# Patient Record
Sex: Female | Born: 1935 | Race: Black or African American | Hispanic: No | Smoking: Former smoker
Health system: Southern US, Community
[De-identification: ages and names within clinical notes are randomized; demographics above are authoritative.]

## PROBLEM LIST (undated history)

## (undated) DIAGNOSIS — K219 Gastro-esophageal reflux disease without esophagitis: Secondary | ICD-10-CM

## (undated) DIAGNOSIS — Z9889 Other specified postprocedural states: Secondary | ICD-10-CM

## (undated) DIAGNOSIS — I495 Sick sinus syndrome: Secondary | ICD-10-CM

## (undated) DIAGNOSIS — I219 Acute myocardial infarction, unspecified: Secondary | ICD-10-CM

## (undated) DIAGNOSIS — Z950 Presence of cardiac pacemaker: Secondary | ICD-10-CM

## (undated) DIAGNOSIS — H9190 Unspecified hearing loss, unspecified ear: Secondary | ICD-10-CM

## (undated) DIAGNOSIS — R7989 Other specified abnormal findings of blood chemistry: Secondary | ICD-10-CM

## (undated) DIAGNOSIS — D649 Anemia, unspecified: Secondary | ICD-10-CM

## (undated) DIAGNOSIS — I209 Angina pectoris, unspecified: Secondary | ICD-10-CM

## (undated) DIAGNOSIS — Z9289 Personal history of other medical treatment: Secondary | ICD-10-CM

## (undated) DIAGNOSIS — I2510 Atherosclerotic heart disease of native coronary artery without angina pectoris: Secondary | ICD-10-CM

## (undated) DIAGNOSIS — R778 Other specified abnormalities of plasma proteins: Secondary | ICD-10-CM

## (undated) DIAGNOSIS — R0602 Shortness of breath: Secondary | ICD-10-CM

## (undated) DIAGNOSIS — E785 Hyperlipidemia, unspecified: Secondary | ICD-10-CM

## (undated) DIAGNOSIS — M75100 Unspecified rotator cuff tear or rupture of unspecified shoulder, not specified as traumatic: Secondary | ICD-10-CM

## (undated) DIAGNOSIS — I10 Essential (primary) hypertension: Secondary | ICD-10-CM

## (undated) HISTORY — PX: APPENDECTOMY: SHX54

## (undated) HISTORY — DX: Sick sinus syndrome: I49.5

## (undated) HISTORY — DX: Personal history of other medical treatment: Z92.89

## (undated) HISTORY — PX: THYROIDECTOMY: SHX17

## (undated) HISTORY — PX: INSERT / REPLACE / REMOVE PACEMAKER: SUR710

---

## 1998-01-20 ENCOUNTER — Other Ambulatory Visit: Admission: RE | Admit: 1998-01-20 | Discharge: 1998-01-20 | Payer: Self-pay | Admitting: Family Medicine

## 1999-08-03 ENCOUNTER — Emergency Department (HOSPITAL_COMMUNITY): Admission: EM | Admit: 1999-08-03 | Discharge: 1999-08-03 | Payer: Self-pay | Admitting: Emergency Medicine

## 1999-08-03 ENCOUNTER — Encounter: Payer: Self-pay | Admitting: Emergency Medicine

## 2000-03-05 ENCOUNTER — Encounter: Admission: RE | Admit: 2000-03-05 | Discharge: 2000-03-05 | Payer: Self-pay | Admitting: Family Medicine

## 2000-03-05 ENCOUNTER — Encounter: Payer: Self-pay | Admitting: Family Medicine

## 2000-09-20 ENCOUNTER — Emergency Department (HOSPITAL_COMMUNITY): Admission: EM | Admit: 2000-09-20 | Discharge: 2000-09-21 | Payer: Self-pay | Admitting: Emergency Medicine

## 2000-10-25 ENCOUNTER — Encounter: Payer: Self-pay | Admitting: Urology

## 2000-10-25 ENCOUNTER — Encounter: Admission: RE | Admit: 2000-10-25 | Discharge: 2000-10-25 | Payer: Self-pay | Admitting: Urology

## 2001-01-23 ENCOUNTER — Encounter: Payer: Self-pay | Admitting: Surgery

## 2001-01-23 ENCOUNTER — Encounter: Admission: RE | Admit: 2001-01-23 | Discharge: 2001-01-23 | Payer: Self-pay | Admitting: Surgery

## 2001-03-08 ENCOUNTER — Encounter: Admission: RE | Admit: 2001-03-08 | Discharge: 2001-03-08 | Payer: Self-pay | Admitting: Family Medicine

## 2001-03-08 ENCOUNTER — Encounter: Payer: Self-pay | Admitting: Family Medicine

## 2001-06-26 ENCOUNTER — Encounter: Payer: Self-pay | Admitting: Family Medicine

## 2001-06-26 ENCOUNTER — Encounter: Admission: RE | Admit: 2001-06-26 | Discharge: 2001-06-26 | Payer: Self-pay | Admitting: Family Medicine

## 2002-03-10 ENCOUNTER — Encounter: Payer: Self-pay | Admitting: Family Medicine

## 2002-03-10 ENCOUNTER — Encounter: Admission: RE | Admit: 2002-03-10 | Discharge: 2002-03-10 | Payer: Self-pay | Admitting: Family Medicine

## 2002-03-19 ENCOUNTER — Encounter: Admission: RE | Admit: 2002-03-19 | Discharge: 2002-03-19 | Payer: Self-pay | Admitting: Family Medicine

## 2002-03-19 ENCOUNTER — Encounter: Payer: Self-pay | Admitting: Family Medicine

## 2002-05-01 ENCOUNTER — Emergency Department (HOSPITAL_COMMUNITY): Admission: EM | Admit: 2002-05-01 | Discharge: 2002-05-01 | Payer: Self-pay | Admitting: *Deleted

## 2002-05-02 ENCOUNTER — Emergency Department (HOSPITAL_COMMUNITY): Admission: EM | Admit: 2002-05-02 | Discharge: 2002-05-02 | Payer: Self-pay | Admitting: Emergency Medicine

## 2003-04-15 ENCOUNTER — Encounter: Admission: RE | Admit: 2003-04-15 | Discharge: 2003-04-15 | Payer: Self-pay | Admitting: Family Medicine

## 2003-04-15 ENCOUNTER — Encounter: Payer: Self-pay | Admitting: Family Medicine

## 2003-11-04 ENCOUNTER — Other Ambulatory Visit: Admission: RE | Admit: 2003-11-04 | Discharge: 2003-11-04 | Payer: Self-pay | Admitting: Family Medicine

## 2004-02-23 ENCOUNTER — Encounter: Admission: RE | Admit: 2004-02-23 | Discharge: 2004-02-23 | Payer: Self-pay | Admitting: Family Medicine

## 2004-02-25 ENCOUNTER — Encounter: Admission: RE | Admit: 2004-02-25 | Discharge: 2004-02-25 | Payer: Self-pay | Admitting: Family Medicine

## 2004-04-22 ENCOUNTER — Encounter: Admission: RE | Admit: 2004-04-22 | Discharge: 2004-04-22 | Payer: Self-pay | Admitting: Family Medicine

## 2004-10-18 ENCOUNTER — Emergency Department (HOSPITAL_COMMUNITY): Admission: EM | Admit: 2004-10-18 | Discharge: 2004-10-19 | Payer: Self-pay | Admitting: Emergency Medicine

## 2005-09-20 ENCOUNTER — Encounter: Admission: RE | Admit: 2005-09-20 | Discharge: 2005-09-20 | Payer: Self-pay | Admitting: Family Medicine

## 2006-11-12 ENCOUNTER — Encounter: Admission: RE | Admit: 2006-11-12 | Discharge: 2006-11-12 | Payer: Self-pay | Admitting: Family Medicine

## 2007-05-01 ENCOUNTER — Encounter: Admission: RE | Admit: 2007-05-01 | Discharge: 2007-05-01 | Payer: Self-pay | Admitting: Family Medicine

## 2007-09-30 ENCOUNTER — Encounter: Admission: RE | Admit: 2007-09-30 | Discharge: 2007-09-30 | Payer: Self-pay | Admitting: Gastroenterology

## 2007-12-23 ENCOUNTER — Encounter: Admission: RE | Admit: 2007-12-23 | Discharge: 2007-12-23 | Payer: Self-pay | Admitting: Family Medicine

## 2007-12-28 ENCOUNTER — Emergency Department (HOSPITAL_COMMUNITY): Admission: EM | Admit: 2007-12-28 | Discharge: 2007-12-28 | Payer: Self-pay | Admitting: Emergency Medicine

## 2008-01-13 ENCOUNTER — Encounter: Admission: RE | Admit: 2008-01-13 | Discharge: 2008-01-13 | Payer: Self-pay | Admitting: Gastroenterology

## 2008-09-28 ENCOUNTER — Emergency Department (HOSPITAL_COMMUNITY): Admission: EM | Admit: 2008-09-28 | Discharge: 2008-09-28 | Payer: Self-pay | Admitting: Emergency Medicine

## 2008-10-02 ENCOUNTER — Encounter: Admission: RE | Admit: 2008-10-02 | Discharge: 2008-10-02 | Payer: Self-pay | Admitting: Family Medicine

## 2008-12-25 ENCOUNTER — Encounter: Admission: RE | Admit: 2008-12-25 | Discharge: 2008-12-25 | Payer: Self-pay | Admitting: Family Medicine

## 2009-11-30 ENCOUNTER — Ambulatory Visit: Payer: Self-pay | Admitting: Vascular Surgery

## 2009-11-30 ENCOUNTER — Ambulatory Visit (HOSPITAL_COMMUNITY)
Admission: RE | Admit: 2009-11-30 | Discharge: 2009-11-30 | Payer: Self-pay | Source: Home / Self Care | Admitting: Family Medicine

## 2009-11-30 ENCOUNTER — Encounter (INDEPENDENT_AMBULATORY_CARE_PROVIDER_SITE_OTHER): Payer: Self-pay | Admitting: Family Medicine

## 2010-01-12 ENCOUNTER — Encounter: Admission: RE | Admit: 2010-01-12 | Discharge: 2010-01-12 | Payer: Self-pay | Admitting: Family Medicine

## 2010-06-30 HISTORY — PX: OTHER SURGICAL HISTORY: SHX169

## 2010-06-30 HISTORY — PX: PACEMAKER INSERTION: SHX728

## 2010-07-19 ENCOUNTER — Inpatient Hospital Stay (HOSPITAL_COMMUNITY): Admission: EM | Admit: 2010-07-19 | Discharge: 2010-07-23 | Payer: Self-pay | Admitting: Emergency Medicine

## 2010-07-19 ENCOUNTER — Emergency Department (HOSPITAL_COMMUNITY): Admission: EM | Admit: 2010-07-19 | Discharge: 2010-07-19 | Payer: Self-pay | Admitting: Emergency Medicine

## 2010-07-19 HISTORY — PX: CARDIAC CATHETERIZATION: SHX172

## 2011-01-12 LAB — CARDIAC PANEL(CRET KIN+CKTOT+MB+TROPI)
CK, MB: 10.4 ng/mL (ref 0.3–4.0)
CK, MB: 114.7 ng/mL (ref 0.3–4.0)
CK, MB: 74.7 ng/mL (ref 0.3–4.0)
CK, MB: 82.4 ng/mL (ref 0.3–4.0)
Relative Index: 11.7 — ABNORMAL HIGH (ref 0.0–2.5)
Relative Index: 12.9 — ABNORMAL HIGH (ref 0.0–2.5)
Relative Index: 14.1 — ABNORMAL HIGH (ref 0.0–2.5)
Relative Index: 3.3 — ABNORMAL HIGH (ref 0.0–2.5)
Total CK: 312 U/L — ABNORMAL HIGH (ref 7–177)
Total CK: 636 U/L — ABNORMAL HIGH (ref 7–177)
Total CK: 637 U/L — ABNORMAL HIGH (ref 7–177)
Total CK: 815 U/L — ABNORMAL HIGH (ref 7–177)
Troponin I: 11.75 ng/mL (ref 0.00–0.06)
Troponin I: 22.46 ng/mL (ref 0.00–0.06)
Troponin I: 29.17 ng/mL (ref 0.00–0.06)
Troponin I: 7.37 ng/mL (ref 0.00–0.06)

## 2011-01-12 LAB — CBC
HCT: 33.1 % — ABNORMAL LOW (ref 36.0–46.0)
HCT: 34.2 % — ABNORMAL LOW (ref 36.0–46.0)
HCT: 38.1 % (ref 36.0–46.0)
Hemoglobin: 10.8 g/dL — ABNORMAL LOW (ref 12.0–15.0)
Hemoglobin: 11.3 g/dL — ABNORMAL LOW (ref 12.0–15.0)
Hemoglobin: 13 g/dL (ref 12.0–15.0)
MCH: 29.5 pg (ref 26.0–34.0)
MCH: 29.8 pg (ref 26.0–34.0)
MCH: 30.2 pg (ref 26.0–34.0)
MCHC: 33 g/dL (ref 30.0–36.0)
MCHC: 33.2 g/dL (ref 30.0–36.0)
MCHC: 34.1 g/dL (ref 30.0–36.0)
MCV: 88.6 fL (ref 78.0–100.0)
MCV: 89.3 fL (ref 78.0–100.0)
MCV: 89.7 fL (ref 78.0–100.0)
Platelets: 173 10*3/uL (ref 150–400)
Platelets: 181 10*3/uL (ref 150–400)
Platelets: 196 10*3/uL (ref 150–400)
RBC: 3.69 MIL/uL — ABNORMAL LOW (ref 3.87–5.11)
RBC: 3.83 MIL/uL — ABNORMAL LOW (ref 3.87–5.11)
RBC: 4.3 MIL/uL (ref 3.87–5.11)
RDW: 14.5 % (ref 11.5–15.5)
RDW: 14.9 % (ref 11.5–15.5)
RDW: 14.9 % (ref 11.5–15.5)
WBC: 6.6 10*3/uL (ref 4.0–10.5)
WBC: 7.2 10*3/uL (ref 4.0–10.5)
WBC: 7.9 10*3/uL (ref 4.0–10.5)

## 2011-01-12 LAB — PROTIME-INR
INR: 1.2 (ref 0.00–1.49)
INR: 1.24 (ref 0.00–1.49)
Prothrombin Time: 15.4 seconds — ABNORMAL HIGH (ref 11.6–15.2)
Prothrombin Time: 15.8 seconds — ABNORMAL HIGH (ref 11.6–15.2)

## 2011-01-12 LAB — BASIC METABOLIC PANEL
BUN: 9 mg/dL (ref 6–23)
CO2: 24 mEq/L (ref 19–32)
Calcium: 8.4 mg/dL (ref 8.4–10.5)
Chloride: 115 mEq/L — ABNORMAL HIGH (ref 96–112)
Creatinine, Ser: 0.71 mg/dL (ref 0.4–1.2)
GFR calc Af Amer: >60 mL/min (ref >60)
GFR calc non Af Amer: >60 mL/min (ref >60)
Glucose, Bld: 88 mg/dL (ref 70–99)
Potassium: 3.8 mEq/L (ref 3.5–5.1)
Sodium: 143 mEq/L (ref 135–145)

## 2011-01-12 LAB — COMPREHENSIVE METABOLIC PANEL
ALT: 17 U/L (ref 0–35)
AST: 65 U/L — ABNORMAL HIGH (ref 0–37)
Albumin: 3 g/dL — ABNORMAL LOW (ref 3.5–5.2)
Alkaline Phosphatase: 43 U/L (ref 39–117)
BUN: 9 mg/dL (ref 6–23)
CO2: 23 mEq/L (ref 19–32)
Calcium: 8.5 mg/dL (ref 8.4–10.5)
Chloride: 113 mEq/L — ABNORMAL HIGH (ref 96–112)
Creatinine, Ser: 0.79 mg/dL (ref 0.4–1.2)
GFR calc Af Amer: >60 mL/min (ref >60)
GFR calc non Af Amer: >60 mL/min (ref >60)
Glucose, Bld: 85 mg/dL (ref 70–99)
Potassium: 3.9 mEq/L (ref 3.5–5.1)
Sodium: 144 mEq/L (ref 135–145)
Total Bilirubin: 0.6 mg/dL (ref 0.3–1.2)
Total Protein: 5.4 g/dL — ABNORMAL LOW (ref 6.0–8.3)

## 2011-01-12 LAB — POCT CARDIAC MARKERS
CKMB, poc: 19.4 ng/mL (ref 1.0–8.0)
Myoglobin, poc: 80.3 ng/mL (ref 12–200)
Troponin i, poc: 1.1 ng/mL (ref 0.00–0.09)

## 2011-01-12 LAB — POCT I-STAT, CHEM 8
BUN: 9 mg/dL (ref 6–23)
Calcium, Ion: 1.04 mmol/L — ABNORMAL LOW (ref 1.12–1.32)
Chloride: 109 mEq/L (ref 96–112)
Creatinine, Ser: 0.7 mg/dL (ref 0.4–1.2)
Glucose, Bld: 124 mg/dL — ABNORMAL HIGH (ref 70–99)
HCT: 42 % (ref 36.0–46.0)
Hemoglobin: 14.3 g/dL (ref 12.0–15.0)
Potassium: 3.3 mEq/L — ABNORMAL LOW (ref 3.5–5.1)
Sodium: 140 mEq/L (ref 135–145)
TCO2: 21 mmol/L (ref 0–100)

## 2011-01-12 LAB — DIFFERENTIAL
Basophils Absolute: 0.1 10*3/uL (ref 0.0–0.1)
Basophils Relative: 1 % (ref 0–1)
Eosinophils Absolute: 0.2 10*3/uL (ref 0.0–0.7)
Eosinophils Relative: 4 % (ref 0–5)
Lymphocytes Relative: 43 % (ref 12–46)
Lymphs Abs: 2.8 10*3/uL (ref 0.7–4.0)
Monocytes Absolute: 0.6 10*3/uL (ref 0.1–1.0)
Monocytes Relative: 9 % (ref 3–12)
Neutro Abs: 2.9 10*3/uL (ref 1.7–7.7)
Neutrophils Relative %: 44 % (ref 43–77)

## 2011-01-12 LAB — LIPID PANEL
Cholesterol: 185 mg/dL (ref 0–200)
HDL: 65 mg/dL (ref >39)
LDL Cholesterol: 111 mg/dL — ABNORMAL HIGH (ref 0–99)
Total CHOL/HDL Ratio: 2.8 RATIO
Triglycerides: 43 mg/dL (ref <150)
VLDL: 9 mg/dL (ref 0–40)

## 2011-01-12 LAB — APTT
aPTT: 26 seconds (ref 24–37)
aPTT: 27 seconds (ref 24–37)

## 2011-01-12 LAB — SEDIMENTATION RATE: Sed Rate: 2 mm/hr (ref 0–22)

## 2011-02-16 ENCOUNTER — Other Ambulatory Visit: Payer: Self-pay | Admitting: Family Medicine

## 2011-02-16 DIAGNOSIS — Z1231 Encounter for screening mammogram for malignant neoplasm of breast: Secondary | ICD-10-CM

## 2011-02-20 ENCOUNTER — Ambulatory Visit
Admission: RE | Admit: 2011-02-20 | Discharge: 2011-02-20 | Disposition: A | Discharge status: Home / Self Care | Payer: PRIVATE HEALTH INSURANCE | Source: Ambulatory Visit | Attending: Family Medicine | Admitting: Family Medicine

## 2011-02-20 DIAGNOSIS — Z1231 Encounter for screening mammogram for malignant neoplasm of breast: Secondary | ICD-10-CM

## 2011-03-01 NOTE — Discharge Summary (Signed)
  [name], [name]               ACCOUNT NO.:  [id number]  MEDICAL RECORD NO.:  [id number]            PATIENT TYPE:  LOCATION:                                 FACILITY:  PHYSICIAN:  Osvaldo Shipper. Konnar Ben, M.D.     DATE OF BIRTH:  DATE OF ADMISSION:  07/19/2010 DATE OF DISCHARGE:  07/23/2010                              DISCHARGE SUMMARY   DISCHARGE DIAGNOSES: 1. ST elevation myocardial infarction. 2. Symptomatic bradycardia. 3. Pacemaker insertion. 4. Hypertension. 5. Degenerative joint disease.  [name] is a 77 year old patient who has a history of depression, degenerative joint disease who came to the emergency department with retrosternal chest pain radiating to the back which woke her up at approximately 2 a.m. on the day of admission.  The patient was brought to the emergency department by EMS.  EKG done showed a sinus bradycardia with first-degree AV block, septal Q-waves with ST elevation in the anterior, lateral and inferior level.  The patient had a CT scan of the chest, abdomen and pelvis which showed no evidence of dissection or PE. The patient was subsequently admitted with acute myocardial infarction.  HOSPITAL COURSE:  Conference was held with the patient and the patient's family concerning the risk and benefits of cardiac catheterization and PTCA and stenting and they are in agreement to proceed with this measure  The patient was taken to the cardiac catheterization laboratory on July 19, 2010, where she underwent a left cardiac cath with selective right and left coronary angiography, successful PTA to the mid LAD.  Successful deployment of a 2.75 x 28-mm long PROMUS drug-eluting stent in the mid LAD and successful dilatation of this stent using the 2.75 x 20-mm [place] tract balloon.  The patient tolerated the procedure well.  She began to show steady improvement in her pain.  She was gradually back to her baseline.  She had serial electrocardiograms  which were reviewed and she received phase one of cardiac rehab while in the hospital setting.  The patient was seen by pharmacy for anticoagulation protocols and she will follow up with Dr. [name] for outpatient coagulation followup and protocol.  The patient showed a steady uphill course and it was the opinion that she had received maximum benefit from this hospitalization and could be discharged home.  The patient is advised to continue her current medications. Anticoagulation will be discussed with Dr. [name] during her 1 week appointment.  The patient also required pacemaker placement.  This was done by Dr. Lendell Caprice without any complication and she will be followed by Dr. Lendell Caprice in 2 weeks for evaluation of the pacemaker.  The patient is advised to notify her physician immediately if any changes, problems or concerns.     Ivery Quale, P.A.   ______________________________ Osvaldo Shipper. Zoella Roberti, M.D.    HB/MEDQ  D:  02/16/2011  T:  02/17/2011  Job:  [id number]  cc:   Osvaldo Shipper. Mclain Freer, M.D. Fax: [phone]  Electronically Signed by Ivery Quale P.A. on 02/23/2011 10:46:49 AM Electronically Signed by Donia Guiles M.D. on 03/01/2011 12:11:20 PM

## 2011-06-06 ENCOUNTER — Inpatient Hospital Stay (HOSPITAL_COMMUNITY): Payer: Medicare Other

## 2011-06-06 ENCOUNTER — Emergency Department (HOSPITAL_COMMUNITY): Payer: Medicare Other

## 2011-06-06 ENCOUNTER — Inpatient Hospital Stay (HOSPITAL_COMMUNITY)
Admission: EM | Admit: 2011-06-06 | Discharge: 2011-06-12 | DRG: 377 | Disposition: A | Discharge status: Home Health | Payer: Medicare Other | Attending: Internal Medicine | Admitting: Internal Medicine

## 2011-06-06 ENCOUNTER — Encounter (HOSPITAL_COMMUNITY): Payer: Self-pay | Admitting: Radiology

## 2011-06-06 DIAGNOSIS — I214 Non-ST elevation (NSTEMI) myocardial infarction: Secondary | ICD-10-CM | POA: Diagnosis present

## 2011-06-06 DIAGNOSIS — K5731 Diverticulosis of large intestine without perforation or abscess with bleeding: Principal | ICD-10-CM | POA: Diagnosis present

## 2011-06-06 DIAGNOSIS — N390 Urinary tract infection, site not specified: Secondary | ICD-10-CM | POA: Diagnosis not present

## 2011-06-06 DIAGNOSIS — H409 Unspecified glaucoma: Secondary | ICD-10-CM | POA: Diagnosis present

## 2011-06-06 DIAGNOSIS — I2510 Atherosclerotic heart disease of native coronary artery without angina pectoris: Secondary | ICD-10-CM | POA: Diagnosis present

## 2011-06-06 DIAGNOSIS — I252 Old myocardial infarction: Secondary | ICD-10-CM

## 2011-06-06 DIAGNOSIS — I10 Essential (primary) hypertension: Secondary | ICD-10-CM | POA: Diagnosis present

## 2011-06-06 DIAGNOSIS — D62 Acute posthemorrhagic anemia: Secondary | ICD-10-CM | POA: Diagnosis present

## 2011-06-06 DIAGNOSIS — Z7982 Long term (current) use of aspirin: Secondary | ICD-10-CM

## 2011-06-06 DIAGNOSIS — E785 Hyperlipidemia, unspecified: Secondary | ICD-10-CM | POA: Diagnosis present

## 2011-06-06 DIAGNOSIS — Z9861 Coronary angioplasty status: Secondary | ICD-10-CM

## 2011-06-06 HISTORY — DX: Essential (primary) hypertension: I10

## 2011-06-06 LAB — CBC
HCT: 30.2 % — ABNORMAL LOW (ref 36.0–46.0)
HCT: 27.4 % — ABNORMAL LOW (ref 36.0–46.0)
HCT: 26.5 % — ABNORMAL LOW (ref 36.0–46.0)
Hemoglobin: 10.1 g/dL — ABNORMAL LOW (ref 12.0–15.0)
Hemoglobin: 9.2 g/dL — ABNORMAL LOW (ref 12.0–15.0)
Hemoglobin: 9 g/dL — ABNORMAL LOW (ref 12.0–15.0)
MCH: 30 pg (ref 26.0–34.0)
MCH: 30.3 pg (ref 26.0–34.0)
MCH: 30.5 pg (ref 26.0–34.0)
MCHC: 33.4 g/dL (ref 30.0–36.0)
MCHC: 33.6 g/dL (ref 30.0–36.0)
MCHC: 34 g/dL (ref 30.0–36.0)
MCV: 89.6 fL (ref 78.0–100.0)
MCV: 90.1 fL (ref 78.0–100.0)
MCV: 89.8 fL (ref 78.0–100.0)
Platelets: 191 10*3/uL (ref 150–400)
Platelets: 164 10*3/uL (ref 150–400)
Platelets: 179 10*3/uL (ref 150–400)
RBC: 3.37 MIL/uL — ABNORMAL LOW (ref 3.87–5.11)
RBC: 3.04 MIL/uL — ABNORMAL LOW (ref 3.87–5.11)
RBC: 2.95 MIL/uL — ABNORMAL LOW (ref 3.87–5.11)
RDW: 14.3 % (ref 11.5–15.5)
RDW: 14.5 % (ref 11.5–15.5)
RDW: 14.5 % (ref 11.5–15.5)
WBC: 6.7 10*3/uL (ref 4.0–10.5)
WBC: 7 10*3/uL (ref 4.0–10.5)
WBC: 7.3 10*3/uL (ref 4.0–10.5)

## 2011-06-06 LAB — COMPREHENSIVE METABOLIC PANEL
ALT: 11 U/L (ref 0–35)
AST: 18 U/L (ref 0–37)
Albumin: 3.4 g/dL — ABNORMAL LOW (ref 3.5–5.2)
Alkaline Phosphatase: 44 U/L (ref 39–117)
BUN: 12 mg/dL (ref 6–23)
CO2: 24 mEq/L (ref 19–32)
Calcium: 8.8 mg/dL (ref 8.4–10.5)
Chloride: 112 mEq/L (ref 96–112)
Creatinine, Ser: 0.71 mg/dL (ref 0.50–1.10)
GFR calc Af Amer: >60 mL/min (ref >60)
GFR calc non Af Amer: >60 mL/min (ref >60)
Glucose, Bld: 116 mg/dL — ABNORMAL HIGH (ref 70–99)
Potassium: 3.9 mEq/L (ref 3.5–5.1)
Sodium: 144 mEq/L (ref 135–145)
Total Bilirubin: 0.3 mg/dL (ref 0.3–1.2)
Total Protein: 5.6 g/dL — ABNORMAL LOW (ref 6.0–8.3)

## 2011-06-06 LAB — ABO/RH: ABO/RH(D): O POS

## 2011-06-06 LAB — DIFFERENTIAL
Basophils Absolute: 0 10*3/uL (ref 0.0–0.1)
Basophils Relative: 0 % (ref 0–1)
Eosinophils Absolute: 0.2 10*3/uL (ref 0.0–0.7)
Eosinophils Relative: 4 % (ref 0–5)
Lymphocytes Relative: 23 % (ref 12–46)
Lymphs Abs: 1.5 10*3/uL (ref 0.7–4.0)
Monocytes Absolute: 0.8 10*3/uL (ref 0.1–1.0)
Monocytes Relative: 12 % (ref 3–12)
Neutro Abs: 4.1 10*3/uL (ref 1.7–7.7)
Neutrophils Relative %: 62 % (ref 43–77)

## 2011-06-06 LAB — PROTIME-INR
INR: 1.38 (ref 0.00–1.49)
Prothrombin Time: 17.2 seconds — ABNORMAL HIGH (ref 11.6–15.2)

## 2011-06-06 LAB — CARDIAC PANEL(CRET KIN+CKTOT+MB+TROPI)
CK, MB: 5.7 ng/mL — ABNORMAL HIGH (ref 0.3–4.0)
CK, MB: 5.1 ng/mL — ABNORMAL HIGH (ref 0.3–4.0)
Relative Index: 2.4 (ref 0.0–2.5)
Relative Index: 2.4 (ref 0.0–2.5)
Total CK: 242 U/L — ABNORMAL HIGH (ref 7–177)
Total CK: 215 U/L — ABNORMAL HIGH (ref 7–177)
Troponin I: <0.3 ng/mL (ref <0.30)
Troponin I: <0.3 ng/mL (ref <0.30)

## 2011-06-06 LAB — LACTIC ACID, PLASMA: Lactic Acid, Venous: 1.5 mmol/L (ref 0.5–2.2)

## 2011-06-06 LAB — POCT I-STAT TROPONIN I: Troponin i, poc: 0.02 ng/mL (ref 0.00–0.08)

## 2011-06-06 LAB — MRSA PCR SCREENING: MRSA by PCR: NEGATIVE

## 2011-06-06 LAB — OCCULT BLOOD, POC DEVICE: Fecal Occult Bld: POSITIVE

## 2011-06-06 MED ORDER — IOHEXOL 300 MG/ML  SOLN
80.0000 mL | Freq: Once | INTRAMUSCULAR | Status: AC | PRN
  Administered 2011-06-06: 80 mL via INTRAVENOUS

## 2011-06-07 LAB — CBC
HCT: 23.8 % — ABNORMAL LOW (ref 36.0–46.0)
HCT: 24.1 % — ABNORMAL LOW (ref 36.0–46.0)
Hemoglobin: 8.2 g/dL — ABNORMAL LOW (ref 12.0–15.0)
Hemoglobin: 8.2 g/dL — ABNORMAL LOW (ref 12.0–15.0)
MCH: 30.8 pg (ref 26.0–34.0)
MCH: 30.6 pg (ref 26.0–34.0)
MCHC: 34.5 g/dL (ref 30.0–36.0)
MCHC: 34 g/dL (ref 30.0–36.0)
MCV: 89.5 fL (ref 78.0–100.0)
MCV: 89.9 fL (ref 78.0–100.0)
Platelets: 160 10*3/uL (ref 150–400)
Platelets: 148 10*3/uL — ABNORMAL LOW (ref 150–400)
RBC: 2.66 MIL/uL — ABNORMAL LOW (ref 3.87–5.11)
RBC: 2.68 MIL/uL — ABNORMAL LOW (ref 3.87–5.11)
RDW: 14.8 % (ref 11.5–15.5)
RDW: 14.7 % (ref 11.5–15.5)
WBC: 7.8 10*3/uL (ref 4.0–10.5)
WBC: 8.1 10*3/uL (ref 4.0–10.5)

## 2011-06-07 LAB — CARDIAC PANEL(CRET KIN+CKTOT+MB+TROPI)
CK, MB: 6.5 ng/mL (ref 0.3–4.0)
Relative Index: 3.4 — ABNORMAL HIGH (ref 0.0–2.5)
Total CK: 192 U/L — ABNORMAL HIGH (ref 7–177)
Troponin I: 0.33 ng/mL (ref <0.30)

## 2011-06-07 LAB — COMPREHENSIVE METABOLIC PANEL
ALT: 8 U/L (ref 0–35)
AST: 15 U/L (ref 0–37)
Albumin: 2.6 g/dL — ABNORMAL LOW (ref 3.5–5.2)
Alkaline Phosphatase: 35 U/L — ABNORMAL LOW (ref 39–117)
BUN: 11 mg/dL (ref 6–23)
CO2: 22 mEq/L (ref 19–32)
Calcium: 7.9 mg/dL — ABNORMAL LOW (ref 8.4–10.5)
Chloride: 113 mEq/L — ABNORMAL HIGH (ref 96–112)
Creatinine, Ser: 0.62 mg/dL (ref 0.50–1.10)
GFR calc Af Amer: >60 mL/min (ref >60)
GFR calc non Af Amer: >60 mL/min (ref >60)
Glucose, Bld: 90 mg/dL (ref 70–99)
Potassium: 3.4 mEq/L — ABNORMAL LOW (ref 3.5–5.1)
Sodium: 141 mEq/L (ref 135–145)
Total Bilirubin: 0.3 mg/dL (ref 0.3–1.2)
Total Protein: 4.6 g/dL — ABNORMAL LOW (ref 6.0–8.3)

## 2011-06-07 LAB — GLUCOSE, CAPILLARY
Glucose-Capillary: 97 mg/dL (ref 70–99)
Glucose-Capillary: 77 mg/dL (ref 70–99)

## 2011-06-08 LAB — CBC
HCT: 23.6 % — ABNORMAL LOW (ref 36.0–46.0)
Hemoglobin: 7.9 g/dL — ABNORMAL LOW (ref 12.0–15.0)
MCH: 30.2 pg (ref 26.0–34.0)
MCHC: 33.5 g/dL (ref 30.0–36.0)
MCV: 90.1 fL (ref 78.0–100.0)
Platelets: 162 10*3/uL (ref 150–400)
RBC: 2.62 MIL/uL — ABNORMAL LOW (ref 3.87–5.11)
RDW: 14.4 % (ref 11.5–15.5)
WBC: 9 10*3/uL (ref 4.0–10.5)

## 2011-06-08 LAB — BASIC METABOLIC PANEL
BUN: 10 mg/dL (ref 6–23)
CO2: 24 mEq/L (ref 19–32)
Calcium: 8.3 mg/dL — ABNORMAL LOW (ref 8.4–10.5)
Chloride: 114 mEq/L — ABNORMAL HIGH (ref 96–112)
Creatinine, Ser: 0.6 mg/dL (ref 0.50–1.10)
GFR calc Af Amer: >60 mL/min (ref >60)
GFR calc non Af Amer: >60 mL/min (ref >60)
Glucose, Bld: 95 mg/dL (ref 70–99)
Potassium: 3.9 mEq/L (ref 3.5–5.1)
Sodium: 142 mEq/L (ref 135–145)

## 2011-06-08 LAB — CK TOTAL AND CKMB (NOT AT ARMC)
CK, MB: 4.8 ng/mL — ABNORMAL HIGH (ref 0.3–4.0)
Relative Index: 2.4 (ref 0.0–2.5)
Total CK: 199 U/L — ABNORMAL HIGH (ref 7–177)

## 2011-06-08 LAB — MAGNESIUM: Magnesium: 2.1 mg/dL (ref 1.5–2.5)

## 2011-06-08 LAB — GLUCOSE, CAPILLARY: Glucose-Capillary: 77 mg/dL (ref 70–99)

## 2011-06-08 LAB — TROPONIN I: Troponin I: 0.75 ng/mL (ref <0.30)

## 2011-06-08 LAB — PREPARE RBC (CROSSMATCH)

## 2011-06-09 LAB — BASIC METABOLIC PANEL
BUN: 7 mg/dL (ref 6–23)
CO2: 26 mEq/L (ref 19–32)
Calcium: 8.5 mg/dL (ref 8.4–10.5)
Chloride: 110 mEq/L (ref 96–112)
Creatinine, Ser: 0.63 mg/dL (ref 0.50–1.10)
GFR calc Af Amer: >60 mL/min (ref >60)
GFR calc non Af Amer: >60 mL/min (ref >60)
Glucose, Bld: 103 mg/dL — ABNORMAL HIGH (ref 70–99)
Potassium: 3.4 mEq/L — ABNORMAL LOW (ref 3.5–5.1)
Sodium: 142 mEq/L (ref 135–145)

## 2011-06-09 LAB — CBC
HCT: 32.5 % — ABNORMAL LOW (ref 36.0–46.0)
Hemoglobin: 11.3 g/dL — ABNORMAL LOW (ref 12.0–15.0)
MCH: 30.3 pg (ref 26.0–34.0)
MCHC: 34.8 g/dL (ref 30.0–36.0)
MCV: 87.1 fL (ref 78.0–100.0)
Platelets: 173 10*3/uL (ref 150–400)
RBC: 3.73 MIL/uL — ABNORMAL LOW (ref 3.87–5.11)
RDW: 14.6 % (ref 11.5–15.5)
WBC: 10.9 10*3/uL — ABNORMAL HIGH (ref 4.0–10.5)

## 2011-06-09 LAB — TYPE AND SCREEN
ABO/RH(D): O POS
Antibody Screen: NEGATIVE
Unit division: 0
Unit division: 0

## 2011-06-09 LAB — PRO B NATRIURETIC PEPTIDE: Pro B Natriuretic peptide (BNP): 862.3 pg/mL — ABNORMAL HIGH (ref 0–450)

## 2011-06-09 LAB — CK TOTAL AND CKMB (NOT AT ARMC)
CK, MB: 3.9 ng/mL (ref 0.3–4.0)
Relative Index: 2 (ref 0.0–2.5)
Total CK: 195 U/L — ABNORMAL HIGH (ref 7–177)

## 2011-06-09 LAB — TROPONIN I: Troponin I: 0.73 ng/mL (ref <0.30)

## 2011-06-09 NOTE — Consult Note (Signed)
[name], [name]               ACCOUNT NO.:  [id number]  MEDICAL RECORD NO.:  [id number]  LOCATION:  3306                         FACILITY:  [facility]  PHYSICIAN:  Willis Modena, MD     DATE OF BIRTH:  02-29-1918  DATE OF CONSULTATION:  06/06/2011 DATE OF DISCHARGE:                                CONSULTATION   PRIMARY CARE PHYSICIAN:  L. Lupe Carney, MD  CARDIOLOGIST:  Thurmon Fair, MD  REASON FOR CONSULTATION:  Hematochezia.  CHIEF COMPLAINT:  Hematochezia.  HISTORY OF PRESENT ILLNESS:  [name] is a 77 year old female with history of coronary artery disease with stenting for ST-elevation MI in September 2011.  She has multiple other medical problems but has been in a fairly good state of health otherwise.  She presented to the emergency department with complaints of profuse hematochezia.  She also notes some generalized crampy abdominal pain which decreases after defecation.  She denies any nausea, vomiting, dysphagia, odynophagia, loss of appetite, or weight loss.  She tells me she has had a colonoscopy in the past but I do not see any evidence of that on our [facility] records.  It does show that she saw Dr. Janann August in 2009 and had a barium enema at that time that showed diffuse diverticulosis, but especially severe in the rectosigmoid area.  Past medical history, past surgical history, home medications, allergies, family history, social history, review of systems all from dictated note from Dr. Toniann Fail dated June 06, 2011 are reviewed and I agree.  PHYSICAL EXAMINATION:  VITAL SIGNS:  Heart rate is about 60 and blood pressure is 123/65. GENERAL:  [name] is an elderly female, but in no acute distress, young appearing than stated age. NECK:  Supple. ENT:  Normocephalic and atraumatic.  Slightly dry mucous membranes. EYES:  Sclerae anicteric.  Conjunctivae are pink. LUNGS:  Clear. HEART:  She has a systolic murmur, best heard at the left upper  sternal border.  She appears to have a paced rhythm. ABDOMEN:  Mild crampy discomfort in her lower abdomen, hyperactive bowel sounds.  No liver or splenic enlargement.  No bulging flank to suggest ascites. EXTREMITIES:  No peripheral cyanosis, clubbing, or edema. NEUROLOGIC:  Diffusely nonfocal without lateralizing signs. SKIN:  No rash or ecchymoses. LYMPHATICS:  No palpable axillary, submandibular, or supraclavicular adenopathy. PSYCHIATRIC:  Age appropriate mood and affect.  LABORATORY STUDIES:  Hemoglobin 9.2 down from 10.1 at admission, white count 7.0, and platelet count is 164.  INR 1.34.  Sodium 144, potassium 3.9, chloride 112, bicarb 24, BUN 12, and creatinine 0.71.  Total protein is 5.6, albumin 3.4, total bilirubin 0.3, alk phos 44, AST 18, and ALT 11.  Abdominal x-ray that showed no acute cardiopulmonary disease, nonspecific bowel gas pattern.  IMPRESSION:  [name] is a 77 year old female presenting with hematochezia.  I suspect this is a diverticular bleed.  She is hemodynamically stable at this point.  PLAN: 1. Continue supportive management with IV fluids and transfusions as     needed with serial CBCs. 2. We will give her sips of clear liquids and see how she does. 3. If she has recurrence of her bleeding, one could consider a tagged  red blood cell study. 4. I will try to obtain her outside colonoscopy report.     Willis Modena, MD     WO/MEDQ  D:  06/06/2011  T:  06/06/2011  Job:  [id number]  Electronically Signed by Willis Modena  on 06/09/2011 07:25:26 PM

## 2011-06-10 LAB — BASIC METABOLIC PANEL
BUN: 7 mg/dL (ref 6–23)
CO2: 26 mEq/L (ref 19–32)
Calcium: 8.8 mg/dL (ref 8.4–10.5)
Chloride: 109 mEq/L (ref 96–112)
Creatinine, Ser: 0.63 mg/dL (ref 0.50–1.10)
GFR calc Af Amer: >60 mL/min (ref >60)
GFR calc non Af Amer: >60 mL/min (ref >60)
Glucose, Bld: 112 mg/dL — ABNORMAL HIGH (ref 70–99)
Potassium: 4 mEq/L (ref 3.5–5.1)
Sodium: 141 mEq/L (ref 135–145)

## 2011-06-10 LAB — CBC
HCT: 32.3 % — ABNORMAL LOW (ref 36.0–46.0)
Hemoglobin: 10.9 g/dL — ABNORMAL LOW (ref 12.0–15.0)
MCH: 30 pg (ref 26.0–34.0)
MCHC: 33.7 g/dL (ref 30.0–36.0)
MCV: 89 fL (ref 78.0–100.0)
Platelets: 176 10*3/uL (ref 150–400)
RBC: 3.63 MIL/uL — ABNORMAL LOW (ref 3.87–5.11)
RDW: 14.6 % (ref 11.5–15.5)
WBC: 8.7 10*3/uL (ref 4.0–10.5)

## 2011-06-10 LAB — MAGNESIUM: Magnesium: 2.2 mg/dL (ref 1.5–2.5)

## 2011-06-11 LAB — CBC
HCT: 32.7 % — ABNORMAL LOW (ref 36.0–46.0)
Hemoglobin: 11 g/dL — ABNORMAL LOW (ref 12.0–15.0)
MCH: 29.9 pg (ref 26.0–34.0)
MCHC: 33.6 g/dL (ref 30.0–36.0)
MCV: 88.9 fL (ref 78.0–100.0)
Platelets: 201 10*3/uL (ref 150–400)
RBC: 3.68 MIL/uL — ABNORMAL LOW (ref 3.87–5.11)
RDW: 14.3 % (ref 11.5–15.5)
WBC: 7.7 10*3/uL (ref 4.0–10.5)

## 2011-06-12 LAB — CBC
HCT: 33.6 % — ABNORMAL LOW (ref 36.0–46.0)
Hemoglobin: 11.5 g/dL — ABNORMAL LOW (ref 12.0–15.0)
MCH: 30.3 pg (ref 26.0–34.0)
MCHC: 34.2 g/dL (ref 30.0–36.0)
MCV: 88.4 fL (ref 78.0–100.0)
Platelets: 237 10*3/uL (ref 150–400)
RBC: 3.8 MIL/uL — ABNORMAL LOW (ref 3.87–5.11)
RDW: 14.3 % (ref 11.5–15.5)
WBC: 7 10*3/uL (ref 4.0–10.5)

## 2011-06-12 LAB — URINALYSIS, ROUTINE W REFLEX MICROSCOPIC
Bilirubin Urine: NEGATIVE
Glucose, UA: NEGATIVE mg/dL
Hgb urine dipstick: NEGATIVE
Ketones, ur: NEGATIVE mg/dL
Nitrite: NEGATIVE
Protein, ur: NEGATIVE mg/dL
Specific Gravity, Urine: 1.017 (ref 1.005–1.030)
Urobilinogen, UA: 2 mg/dL — ABNORMAL HIGH (ref 0.0–1.0)
pH: 8 (ref 5.0–8.0)

## 2011-06-12 LAB — BASIC METABOLIC PANEL
BUN: 11 mg/dL (ref 6–23)
CO2: 28 mEq/L (ref 19–32)
Calcium: 9.1 mg/dL (ref 8.4–10.5)
Chloride: 108 mEq/L (ref 96–112)
Creatinine, Ser: 0.55 mg/dL (ref 0.50–1.10)
GFR calc Af Amer: >60 mL/min (ref >60)
GFR calc non Af Amer: >60 mL/min (ref >60)
Glucose, Bld: 91 mg/dL (ref 70–99)
Potassium: 4.1 mEq/L (ref 3.5–5.1)
Sodium: 141 mEq/L (ref 135–145)

## 2011-06-12 LAB — URINE MICROSCOPIC-ADD ON

## 2011-06-15 NOTE — Discharge Summary (Signed)
NAMERORI, GOAR               ACCOUNT NO.:  [id number]  MEDICAL RECORD NO.:  [id number]  LOCATION:  5528                         FACILITY:  [facility]  PHYSICIAN:  Andreas Blower, MD       DATE OF BIRTH:  [birth year]  DATE OF ADMISSION:  06/06/2011 DATE OF DISCHARGE:  06/12/2011                              DISCHARGE SUMMARY   PRIMARY CARE PHYSICIAN:  L. Lupe Carney, MD  CARDIOLOGIST:  Eduardo Osier. [name], MD  DISCHARGE DIAGNOSES: 1. Gastrointestinal bleeding, likely diverticular in nature, status     post transfusion of 2 units of packed red blood cells. 2. Non-ST elevation myocardial infarction - likely from demand     ischemia, discharged on aspirin. 3. Acute blood loss anemia. 4. History of coronary artery disease, status post stent placement and     pacemaker. 5. History of hypertension. 6. Glaucoma. 7. Hyperlipidemia. 8. Urinary tract infection.  CONDITION AT TIME OF DISCHARGE:  Ms. Lavanda Nevels appears well.  She is eating well.  She is ambulating in the hallway with physical therapy on a rolling walker.  She is ready for discharge to home where she lives with her sister who is also elderly and somewhat infirm.  BRIEF HISTORY AND HOSPITAL COURSE:  This is a 77 year old female with a history of coronary artery disease, status post stenting for ST- elevation MI in September 2011.  She presented to the emergency department experiencing rectal bleeding.  She stated that initially she was constipated.  She took some medicines, started having bowel movements, but eventually developed pain and rectal bleeding.  She had multiple episodes of bleeding.    In the emergency department, she was found to have a hemoglobin around 10 with her stool being guaiac positive.  Further on patient's EKG was found to have an acute elevation in the lateral leads, but the patient at this time did not have any chest pain and the cardiac enzymes were negative.  Dr. [name] was called in  consultation.  Dr. Bosie Clos of [facility] was called in consultation for GI bleeding and the patient was admitted to the [facility] for further evaluation and workup.  She was initially admitted to Step-Down Unit and received 2 units of packed red blood cells.    She improved quickly and was transferred out to the floor where she was monitored closely.  Her cardiac enzymes were elevated during this admission.  They peaked at a high of 0.75 on June 08, 2011.  Dr. [name] saw the patient in consultation, felt that given her GI bleeding and the fact that her stent placement was a year ago, her Plavix could be discontinued and her aspirin could be dosed at 81 mg.  He felt that her non-ST elevation MI was secondary to demand ischemia.  He also started her on a beta-blocker, Coreg 3.125 p.o. b.i.d. and discontinued her amlodipine.  With regard to her rectal bleeding, fortunately after her admission on Tuesday, the patient had no further rectal bleeding.  GI consult was held.  It was noted that the patient had severe diverticulosis, particularly sigmoid diverticulosis on barium enema by Dr. Clerance Lav office back in 2008.  A CT abdomen  and pelvis was done here on June 06, 2011, that showed no evidence of bowel obstruction.  She did have colonic diverticulosis without associated inflammatory changes or colonic wall thickening.  It was felt that her bleeding was secondary to diverticular bleeding.  Fortunately, the patient had no further bleeding.  Her aspirin was changed to 81 mg and her diet was advanced.  She has done well since and has been maintained on stool softeners to avoid constipation.  On the day of discharge, the patient reported burning with urination. She said this started when her Foley catheter was removed.  It is noteworthy that the patient is normally maintained on daily nitrofurantoin; however, this was not continued in the hospital.   The patient reports that the burning started after her Foley catheter was removed.  A stat urinalysis was collected.  Results revealed 7-10 white blood cells and moderate leukocytes.  Consequently, it was decided that the patient should be treated for urinary tract infection.  She will be discharged to home on Cipro 250 mg 1 tablet b.i.d. for 5 days and should follow up with her primary care physician for this.  PHYSICAL EXAMINATION:  GENERAL:  Today, the day of discharge, the patient is alert and oriented.  She is extremely pleasant, ambulating well in the hallway with her rolling walker. VITAL SIGNS:  Temperature 97.5, pulse 61, respirations 18, blood pressure 140/66, O2 saturations 98% on room air. HEENT:  Head is atraumatic normocephalic.  Eyes are anicteric with pupils are equal, round.  Nose shows no nasal discharge or exterior lesions.  Mouth has moist mucous membranes. NECK:  Supple with midline trachea.  No JVD.  No lymphadenopathy. CHEST:  Demonstrates no accessory muscle use.  She has no wheezes or crackles to my exam. HEART:  Regular rate and rhythm. ABDOMEN:  Soft, nontender, nondistended with good bowel sounds. EXTREMITIES:  Show no clubbing, cyanosis or edema. PSYCHIATRIC:  Patient is alert and oriented.  Demeanor is pleasant and cooperative.  Grooming is good. NEURO:  Cranial nerves II-XII are grossly intact.  She has no facial asymmetries.  No obvious focal neuro deficits.  PERTINENT LABS IN THIS HOSPITALIZATION:  Today, the day of discharge, the patient's hemoglobin is 11.5, WBC 7.0, hematocrit 33.6, MCV value 88.4, platelets 237.  BMET shows sodium 141, potassium 4.1, chloride 108, bicarb 28, glucose 91, BUN 11, creatinine 0.55, serum calcium is 9.1.  As mentioned, her urinalysis showed moderate leukocytes and 7-10 white blood cells as well as many bacteria.  We will need to follow urine cultures on this or would ask that Dr. Lupe Carney to follow up on her  urinary cultures as an outpatient to ensure that the best antibiotics were chosen.  PERTINENT RADIOLOGICAL EXAM:  The patient had a CT abdomen and pelvis on August 7.  Results were listed above in brief history and hospital course.  She also had acute an acute abdominal series on June 06, 2011, that showed no acute cardiopulmonary findings.  Abdominal bowel gas could reflect mild ileus or gastroenteritis.  DISCHARGE MEDICATIONS: 1. Acetaminophen 325 mg 1-2 tablets every 4 hours as needed for pain. 2. Aspirin 81 mg by mouth daily. 3. Carvedilol 3.125 mg 1 tablet by mouth twice a day with meals. 4. Cipro 250 mg 1 tablet by mouth twice daily for 5 days. 5. Docusate sodium 100 mg by mouth daily at bedtime as needed.  We     recommend that the patient take this daily to avoid constipation  and further rectal bleeding. 6. Ferrous sulfate 325 mg 1 tablet by mouth twice daily. 7. Monistat 2% cream one application topically twice daily. 8. Ramipril 10 mg caplets, 1 caplet by mouth daily. 9. Azopt ophthalmic suspension 1%, 1 drop in left eye three times a     day. 10.Famotidine 20 mg 1 tablet by mouth twice daily. 11.Hydrocodone/acetaminophen 5/500 mg 1 tablet by mouth every 6 hours     as needed for pain. 12.Lumigan 1 drop in the left eye daily. 13.Prednisolone sodium phosphate ophthalmic solution 1 drop in the     right eye four times daily. 14.Rosuvastatin 10 mg 1 tablet by mouth daily.  The patient will stop taking the following medications; 1. Amlodipine. 2. Plavix. 3. Aspirin 325 mg as her dosage has been reduced to 81 mg.  DISCHARGE INSTRUCTIONS:  The patient will be discharged to home with home health aide and home health physical therapy.  Activity will be as tolerated and as directed by physical therapy.  She will go home with a rolling walker.  Diet will be low sodium, heart healthy.  Followup appointments include Dr. [name] on June 28, 2011, at 10 a.m., also Dr. Lupe Carney on June 21, 2011, at 8:45 a.m.  Home health agency will be [place].  CONSIDERATION FOR OUTPATIENT FOLLOWUP: 1. The patient has been started on new blood pressure medication Coreg     that needs to be dosed by Dr. [name] in followup. 2. She has had recent rectal bleeding.  Her hemoglobin should be     monitored.  It is stable at this point.  There has been no rectal     bleeding for the past 6 days. 3. She has had a urinary tract infection in house.  We are starting     Cipro for this today.  Cultures need to be followed.   Time spent on discharge, talking to the patient and coordinating care was 35 mins.   Stephani Police, PA   ______________________________ Andreas Blower, MD    MLY/MEDQ  D:  06/12/2011  T:  06/13/2011  Job:  [id number]  cc:   Eduardo Osier. [name], M.D. Elsworth Soho, M.D.  Electronically Signed by Algis Downs PA on 06/15/2011 08:57:54 AM Electronically Signed by Wardell Heath Hamdan Toscano  on 06/15/2011 10:57:03 AM

## 2011-07-04 NOTE — H&P (Signed)
[name], [name]               ACCOUNT NO.:  [id number]  MEDICAL RECORD NO.:  [id number]  LOCATION:  [facility]                         FACILITY:  [facility]  PHYSICIAN:  [name], MDDATE OF BIRTH:  [birth year]  DATE OF ADMISSION:  06/06/2011 DATE OF DISCHARGE:                             HISTORY & PHYSICAL   PRIMARY CARE PHYSICIAN:  Dr. Melba Coon.  PRIMARY CARDIOLOGIST:  Eduardo Osier. Amy Lull, MD.  CHIEF COMPLAINT:  Rectal bleeding.  HISTORY OF PRESENT ILLNESS:  A 77 year old female with history of CAD, status post stenting for ST-elevation MI in September 2011 with pacemaker insertion for symptomatic bradycardia, history of hypertension, degenerative joint disease, has been experiencing rectal bleeding times last evening.  The patient states he initially was constipated.  She took some medicine after which she started having bowel movements, eventually developed abdominal pain and rectal bleeding, multiple episodes.  She felt very weak and tired.  Denies any chest pain or shortness of breath.  Denies any loss of consciousness, although, she feels fatigued and weak.  She dose have multiple episode of nausea and vomiting.  There was no blood in the nausea and vomiting. Her abdominal pain is more of crampy in nature, diffuse all over the abdomen.  The patient denies any cough, phlegm, fever, or chills. Denies any headache or visual symptoms.  In the ER, the patient was found to have a hemoglobin around 10 with stool guaiac being positive.  At this time, the patient has been admitted for further workup.  In addition the patient's EKG has been found to have acute elevation in the lateral, but the patient at this time does not have any chest pain. Cardiac enzymes has been negative.  I already called Dr. Sharyn [name], was requested a repeat EKG and he is going to come and see the patient. Also, called GI, Dr. Bosie [name].  PAST MEDICAL HISTORY:  History of CAD status post stenting  for ST- elevation MI in September 2011, history of hypertension, history of symptomatic bradycardia status post pacemaker placement, history of hypertension, hyperlipidemia, glaucoma.  PAST SURGICAL HISTORY:  Stent placement and pacemaker placement.  MEDICATIONS PRIOR TO ADMISSION:  The patient is on; 1. Nitrofurantoin at bedtime. 2. Lumigan eye drops. 3. Hydrocodone and acetaminophen 5/500 p.o. q.6 p.r.n. 4. Famotidine 20 mg p.o. twice a day. 5. Rosuvastatin 10 mg daily. 6. Ramipril 10 mg twice daily. 7. Prednisolone eye drops. 8. Plavix 75 mg p.o. daily. 9. Azopt. 10.Aspirin. 11.Amlodipine 5 mg p.o. daily.  ALLERGIES:  No known drug allergies.  SOCIAL HISTORY:  The patient does not smoke cigarette, drink alcohol, or use illegal drugs.  Lives with her sister.  FAMILY HISTORY:  Nothing contributory.  REVIEW OF SYSTEMS:  As per in the history of presenting illness, nothing else significant.  PHYSICAL EXAMINATION:  GENERAL:  The patient examined at bedside, not in acute distress. VITAL SIGNS:  Blood pressure is 130/60, pulse 60 per minute, temperature 97.2, respirations 18 per minute, O2 sat 98%. HEENT:  Anicteric.  No pallor.  No facial asymmetry.  Tongue is midline. CHEST:  Bilateral air entry present.  No rhonchi.  No crepitation. HEART:  S1 and S2 heard. ABDOMEN:  Soft,  nontender.  Bowel sounds present. CNS:  The patient is alert, awake, and oriented to time, place, and person.  She is hard of hearing.  Moves upper and lower extremities, 5/5. EXTREMITIES:  Peripheral pulses felt.  No edema.  LABS:  EKG shows normal sinus rhythm, now with poor R-wave progression and has ST-elevation in V4-V5 with no reciprocal changes.  CBC:  WBC is 6.7, hemoglobin 10.1, hematocrit 30.2, platelets 191.  PT/INR is 17.2 and 1.3.  Complete metabolic panel:  Sodium 144, potassium 2.9, chloride 112, carbon dioxide 24, glucose 116.  BUN 12, creatinine 0.7, total bilirubin is 0.3, alkaline  phos is 44, AST is 18, ALT 11, total protein 5.6, albumin 2.4, calcium 8.8, troponin 0.02.  Fecal occult blood is positive.  ASSESSMENT: 1. Rectal bleeding. 2. Abnormal EKG. 3. History of ST-elevation myocardial elevation status post stent     placement. 4. History of symptomatic bradycardia status post pacemaker placement. 5. History of hypertension. 6. History of glaucoma. 7. Anemia secondary to probable gastrointestinal bleed.  PLAN: 1. At this time, admit the patient to step-down unit. 2. For her GI bleed, the patient will be kept n.p.o.  We will hold off     Plavix and aspirin for now until Cardiology Dr. Sharyn [name] sees.     Also, consult GI, Dr. Bosie [name], who is going to see the patient.     We will be checking CBC q.6 hourly x4 and one stat now.  We will     type and cross match 2 units and hold.  Transfuse as needed. 3. Abnormal EKG with history of CAD status post stenting.  At this     time, we will discuss with Dr. Sharyn [name], who is going to see the     patient and he has asked me to repeat another EKG.  We will be     cycling cardiac markers, nitroglycerin p.r.n.  As the patient does     have GI bleed, at this time, I am holding aspirin and Plavix until     cardiologist consult sees. 4. As the patient does have abdominal pain, we will add a lipase     level, acute abdominal series at this time, it does not show     anything acute except for some possible mild ileus or     gastroenteritis, for which I am going to get stool studies and also     CT abdomen and pelvis.  Further recommendations as condition     evolves.     Amy Clos, MD     ANK/MEDQ  D:  06/06/2011  T:  06/06/2011  Job:  [id number]  cc:   Melba Coon, MD Eduardo Osier [name], M.D.  Electronically Signed by Amy Minium MD on 07/04/2011 09:21:33 AM

## 2011-07-13 DIAGNOSIS — Z9289 Personal history of other medical treatment: Secondary | ICD-10-CM

## 2011-07-13 HISTORY — DX: Personal history of other medical treatment: Z92.89

## 2011-07-20 ENCOUNTER — Other Ambulatory Visit (HOSPITAL_COMMUNITY): Payer: PRIVATE HEALTH INSURANCE

## 2011-07-21 LAB — URINALYSIS, ROUTINE W REFLEX MICROSCOPIC
Bilirubin Urine: NEGATIVE
Glucose, UA: NEGATIVE
Hgb urine dipstick: NEGATIVE
Ketones, ur: NEGATIVE
Leukocytes, UA: NEGATIVE
Nitrite: NEGATIVE
Protein, ur: NEGATIVE
Specific Gravity, Urine: 1.008
Urobilinogen, UA: 0.2
pH: 5.5

## 2011-07-21 LAB — URINE MICROSCOPIC-ADD ON

## 2011-09-01 NOTE — Patient Instructions (Addendum)
   Your procedure is scheduled on: Thursday November 8th  Enter through the Main Entrance of [facility] at:6am Pick up the phone at the desk and dial [phone] and inform us of your arrival.  Please call this number if you have any problems the morning of surgery: [phone]  Remember: Do not eat food after midnight:Wednesday Do not drink clear liquids after:midnight Wednesday Take these medicines the morning of surgery with a SIP OF WATER: medications per anesthesia  Do not wear jewelry, make-up, or FINGER nail polish Do not wear lotions, powders, or perfumes.  You may not  wear deodorant. Do not shave 48 hours prior to surgery. Do not bring valuables to the hospital.  Patients discharged on the day of surgery will not be allowed to drive home.   Name and phone number of your driver:  Lennart Pall    Remember to use your hibiclens as instructed.Please shower with 1/2 bottle the evening before your surgery and the other 1/2 bottle the morning of surgery.

## 2011-09-05 ENCOUNTER — Encounter (HOSPITAL_COMMUNITY): Payer: Self-pay

## 2011-09-05 ENCOUNTER — Encounter (HOSPITAL_COMMUNITY)
Admission: RE | Admit: 2011-09-05 | Discharge: 2011-09-05 | Disposition: A | Discharge status: Home / Self Care | Payer: Medicare Other | Source: Ambulatory Visit | Attending: Obstetrics and Gynecology | Admitting: Obstetrics and Gynecology

## 2011-09-05 HISTORY — DX: Hyperlipidemia, unspecified: E78.5

## 2011-09-05 HISTORY — DX: Acute myocardial infarction, unspecified: I21.9

## 2011-09-05 HISTORY — DX: Anemia, unspecified: D64.9

## 2011-09-05 HISTORY — DX: Atherosclerotic heart disease of native coronary artery without angina pectoris: I25.10

## 2011-09-05 HISTORY — DX: Shortness of breath: R06.02

## 2011-09-05 HISTORY — DX: Gastro-esophageal reflux disease without esophagitis: K21.9

## 2011-09-05 HISTORY — DX: Other specified postprocedural states: Z98.890

## 2011-09-05 HISTORY — DX: Angina pectoris, unspecified: I20.9

## 2011-09-05 LAB — BASIC METABOLIC PANEL
BUN: 13 mg/dL (ref 6–23)
CO2: 28 mEq/L (ref 19–32)
Calcium: 9.8 mg/dL (ref 8.4–10.5)
Chloride: 107 mEq/L (ref 96–112)
Creatinine, Ser: 0.83 mg/dL (ref 0.50–1.10)
GFR calc Af Amer: 69 mL/min — ABNORMAL LOW (ref >90)
GFR calc non Af Amer: 60 mL/min — ABNORMAL LOW (ref >90)
Glucose, Bld: 100 mg/dL — ABNORMAL HIGH (ref 70–99)
Potassium: 4.2 mEq/L (ref 3.5–5.1)
Sodium: 141 mEq/L (ref 135–145)

## 2011-09-05 LAB — CBC
HCT: 40.8 % (ref 36.0–46.0)
Hemoglobin: 13.4 g/dL (ref 12.0–15.0)
MCH: 30.8 pg (ref 26.0–34.0)
MCHC: 32.8 g/dL (ref 30.0–36.0)
MCV: 93.8 fL (ref 78.0–100.0)
Platelets: 249 10*3/uL (ref 150–400)
RBC: 4.35 MIL/uL (ref 3.87–5.11)
RDW: 14.2 % (ref 11.5–15.5)
WBC: 5.2 10*3/uL (ref 4.0–10.5)

## 2011-09-05 NOTE — Anesthesia Preprocedure Evaluation (Addendum)
Anesthesia Evaluation  Patient identified by MRN, date of birth, ID band Patient awake    Reviewed: Allergy & Precautions, H&P , Patient's Chart, lab work & pertinent test results  Airway Mallampati: II TM Distance: >3 FB Neck ROM: Full    Dental  (+) Lower Dentures and Upper Dentures   Pulmonary shortness of breath and with exertion,  clear to auscultation  Pulmonary exam normal       Cardiovascular Exercise Tolerance: Good hypertension, Pt. on medications and Pt. on home beta blockers + angina with exertion + CAD, + Past MI and + Cardiac Stents Regular Normal Had Permanent Pacemaker inserted for symptomatic Bradycardia Raked Leaves all day 2 days ago. Denies CP SOB.   Neuro/Psych Negative Neurological ROS  Negative Psych ROS   GI/Hepatic Neg liver ROS, GERD-  Medicated and Controlled,  Endo/Other  Negative Endocrine ROS  Renal/GU negative Renal ROS  Genitourinary negative   Musculoskeletal  (+) Arthritis -, Osteoarthritis,    Abdominal Normal abdominal exam  (+)   Peds  Hematology negative hematology ROS (+)   Anesthesia Other Findings Patient did not take any medications this morning and did not place NTG patch on her skin.  BP this am 149/112.  Likely will either delay until BP better controlled or cancel and reschedule for another day  Reproductive/Obstetrics negative OB ROS                          Anesthesia Physical Anesthesia Plan  ASA: III  Anesthesia Plan: MAC, General and Spinal   Post-op Pain Management:    Induction: Intravenous  Airway Management Planned:   Additional Equipment:   Intra-op Plan:   Post-operative Plan:   Informed Consent: I have reviewed the patients History and Physical, chart, labs and discussed the procedure including the risks, benefits and alternatives for the proposed anesthesia with the patient or authorized representative who has indicated  his/her understanding and acceptance.   Dental advisory given  Plan Discussed with: Anesthesiologist  Anesthesia Plan Comments: (Patient agreeable to any type of anesthesia.She just doesn't want to hurt. Pacemaker to be interrogated by Cardiology. )        Anesthesia Quick Evaluation

## 2011-09-05 NOTE — Pre-Procedure Instructions (Signed)
Dr Malen Gauze saw patient in [facility].  Reviewed her history and chart/EKG.  Dr Malen Gauze said patient ok for surgery on Thursday, 09/07/11 at 730 am.  No medical clearance needed per Dr Malen Gauze, however, Pacemaker information is needed for surgery and had already been requested.  CBC/BMP drawn. No EKG needed copies of 05/2011 EKG on chart.  Patient insturucted verbally and written to take these meds on DOS:  Altace, Pepcid, Coreg, and Nitro-Dur Patch.  Patient and her daughter Claris Che verbalized understanding.

## 2011-09-05 NOTE — Pre-Procedure Instructions (Signed)
Patient is 89 yr.  Poor historian.  Patient has pacemaker inserted in 06/2010.  Dr Royann Shivers manages pacemaker.  Dr [name] is her cardiologist.  Patient needs medical clearance and requested letter of medical clearance from him was sent by fax today.  No MD orders at time of pre-op appointment.  Poor historian for medications that she takes.  Verified medications by Mechele Collin, Pharmacy by bag of medications that patient brought to pre-op appointment.  However, they do not agree with [facility] on 06/06/2011 when patient was in hospital.  Will consult with anesthesia.

## 2011-09-06 MED ORDER — DEXTROSE 5 % IV SOLN
1.0000 g | INTRAVENOUS | Status: AC
  Administered 2011-09-07: 1 g via INTRAVENOUS
  Filled 2011-09-06: qty 1

## 2011-09-06 NOTE — H&P (Signed)
[name], [name]               ACCOUNT NO.:  [id number]  MEDICAL RECORD NO.:  [id number]  LOCATION:  [facility]                           FACILITY:  [facility]  PHYSICIAN:  Dineen Kid. Rana Snare, M.D.    DATE OF BIRTH:  1918-02-26  DATE OF ADMISSION:  09/05/2011 DATE OF DISCHARGE:  09/05/2011                             HISTORY & PHYSICAL   HISTORY OF PRESENT ILLNESS:  [name] is a 77 year old black female, G2, P2, who originally came to me last June for pelvic discomfort.  She underwent a routine exam followed by ultrasound evaluation.  Ultrasound evaluation showed fluid within the endometrial cavity, +6 mm density within the endometrial cavity, which is elongated in the 1-2 cm range which was consistent with a endometrial polyp.  No other abnormalities were noted on the ultrasound.  Since June we had multiple attempts trying to get her to the operating room for D and C because of different medical problems including heart stent and on Plavix.  She was unable to present before now.  Currently she is off her Plavix and is healthy and is presenting for hysteroscopy, D and C for evaluation and removal of the endometrial polyp.  PAST MEDICAL HISTORY:  Significant for pacemaker and heart disease with a stent.  Currently off her Plavix and has had medical clearance to proceed with the surgery.  Her list of medications include ketoconazole, Lumigan, Nevanac ophthalmic ointment, prednisone drops for her eyes, Azopt for eyes, nitrofurantoin, Vicodin as needed, meloxicam, Estrace vaginal cream, Crestor, clopidogrel bisulfate.  She is on Pepcid and also amlodipine.  PHYSICAL EXAMINATION:  Uterus is anteverted, mobile, nontender.  She has a large cystocele and rectocele.  Ultrasound does show endometrial fluid within the cavity and 6 mm wide density that is 1-2 cm in length.  IMPRESSION/PLAN:  Postmenopausal woman with endometrial mass suspicious for polyps.  She presents for hysteroscopy, D and C, and  removal of this polyp.  Discussed the risks and benefits of the procedure at length which include but not limited to, risk of infection, bleeding, damage to the uterus, tubes, ovaries, bowel, bladder, risks associated with anesthesia, especially with her advanced age and also multiple medical problems.  Discussed the likelihood of success, removing the polyp is excellent, however, she could require further surgery if there is something malignant or premalignant.  She and her daughter gives their informed consent and all their questions answered.     Dineen Kid Rana Snare, M.D.     DCL/MEDQ  D:  09/06/2011  T:  09/06/2011  Job:  [id number]

## 2011-09-06 NOTE — H&P (Addendum)
  H&P dictated #[id number] 09/06/11 0830 DL  Pt seen and examined.  Questions answered and informed consent obtained DL 82/9/56 2130

## 2011-09-06 NOTE — Pre-Procedure Instructions (Signed)
Received perioperative prescription for implanted cardiac device programming for DOS.  Placed in patient's chart.

## 2011-09-06 NOTE — Pre-Procedure Instructions (Signed)
No fax received from Dr Croitoru's office regarding Pacemaker instructions for surgery on Thursday, 09/07/11.  Gaylyn Rong, Diplomatic Services operational officer is following up with Dr Croitoru's office this morning and will continue to follow up during the day to make sure it is obtained.  We will talk with anesthesia this afternoon if it has not been obtained.

## 2011-09-07 ENCOUNTER — Encounter (HOSPITAL_COMMUNITY): Payer: Self-pay | Admitting: *Deleted

## 2011-09-07 ENCOUNTER — Other Ambulatory Visit: Payer: Self-pay | Admitting: Obstetrics and Gynecology

## 2011-09-07 ENCOUNTER — Ambulatory Visit (HOSPITAL_COMMUNITY): Payer: Medicare Other | Admitting: Anesthesiology

## 2011-09-07 ENCOUNTER — Encounter (HOSPITAL_COMMUNITY): Payer: Self-pay | Admitting: Anesthesiology

## 2011-09-07 ENCOUNTER — Encounter (HOSPITAL_COMMUNITY)
Admission: RE | Disposition: A | Discharge status: Home / Self Care | Payer: Self-pay | Source: Ambulatory Visit | Attending: Obstetrics and Gynecology

## 2011-09-07 ENCOUNTER — Ambulatory Visit (HOSPITAL_COMMUNITY)
Admission: RE | Admit: 2011-09-07 | Discharge: 2011-09-07 | Disposition: A | Discharge status: Home / Self Care | Payer: Medicare Other | Source: Ambulatory Visit | Attending: Obstetrics and Gynecology | Admitting: Obstetrics and Gynecology

## 2011-09-07 DIAGNOSIS — Z01812 Encounter for preprocedural laboratory examination: Secondary | ICD-10-CM | POA: Insufficient documentation

## 2011-09-07 DIAGNOSIS — N840 Polyp of corpus uteri: Secondary | ICD-10-CM

## 2011-09-07 DIAGNOSIS — Z01818 Encounter for other preprocedural examination: Secondary | ICD-10-CM | POA: Insufficient documentation

## 2011-09-07 DIAGNOSIS — N882 Stricture and stenosis of cervix uteri: Secondary | ICD-10-CM | POA: Insufficient documentation

## 2011-09-07 DIAGNOSIS — N719 Inflammatory disease of uterus, unspecified: Secondary | ICD-10-CM | POA: Insufficient documentation

## 2011-09-07 HISTORY — PX: HYSTEROSCOPY WITH D & C: SHX1775

## 2011-09-07 LAB — PROTIME-INR
INR: 1.22 (ref 0.00–1.49)
Prothrombin Time: 15.7 seconds — ABNORMAL HIGH (ref 11.6–15.2)

## 2011-09-07 LAB — APTT: aPTT: 32 seconds (ref 24–37)

## 2011-09-07 SURGERY — DILATATION AND CURETTAGE /HYSTEROSCOPY
Anesthesia: Monitor Anesthesia Care | Site: Vagina | Wound class: Clean Contaminated

## 2011-09-07 MED ORDER — LIDOCAINE HCL (PF) 1 % IJ SOLN
INTRAMUSCULAR | Status: DC | PRN
  Administered 2011-09-07: 20 mL

## 2011-09-07 MED ORDER — FENTANYL CITRATE 0.05 MG/ML IJ SOLN
INTRAMUSCULAR | Status: DC | PRN
  Administered 2011-09-07: 50 ug via INTRAVENOUS

## 2011-09-07 MED ORDER — PHENYLEPHRINE HCL 10 MG/ML IJ SOLN
INTRAMUSCULAR | Status: DC | PRN
  Administered 2011-09-07 (×3): 40 mg via INTRAVENOUS

## 2011-09-07 MED ORDER — ACETAMINOPHEN 325 MG PO TABS: 325.0000 mg | ORAL_TABLET | ORAL | Status: DC | PRN

## 2011-09-07 MED ORDER — KETOROLAC TROMETHAMINE 30 MG/ML IJ SOLN: 15.0000 mg | Freq: Once | INTRAMUSCULAR | Status: DC | PRN

## 2011-09-07 MED ORDER — FENTANYL CITRATE 0.05 MG/ML IJ SOLN
INTRAMUSCULAR | Status: AC
  Filled 2011-09-07: qty 2

## 2011-09-07 MED ORDER — LACTATED RINGERS IV SOLN
INTRAVENOUS | Status: DC
  Administered 2011-09-07: 07:00:00 via INTRAVENOUS

## 2011-09-07 MED ORDER — LIDOCAINE HCL (CARDIAC) 20 MG/ML IV SOLN
INTRAVENOUS | Status: DC | PRN
  Administered 2011-09-07: 50 mg via INTRAVENOUS

## 2011-09-07 MED ORDER — PROMETHAZINE HCL 25 MG/ML IJ SOLN: 6.2500 mg | INTRAMUSCULAR | Status: DC | PRN

## 2011-09-07 MED ORDER — FENTANYL CITRATE 0.05 MG/ML IJ SOLN: 25.0000 ug | INTRAMUSCULAR | Status: DC | PRN

## 2011-09-07 MED ORDER — FERRIC SUBSULFATE 259 MG/GM EX SOLN
CUTANEOUS | Status: DC | PRN
  Administered 2011-09-07: 1 via TOPICAL

## 2011-09-07 MED ORDER — PROPOFOL 10 MG/ML IV EMUL
INTRAVENOUS | Status: DC | PRN
  Administered 2011-09-07 (×6): 20 mg via INTRAVENOUS

## 2011-09-07 MED ORDER — ONDANSETRON HCL 4 MG/2ML IJ SOLN
INTRAMUSCULAR | Status: DC | PRN
  Administered 2011-09-07: 4 mg via INTRAVENOUS

## 2011-09-07 SURGICAL SUPPLY — 14 items
CANISTER SUCTION 2500CC (MISCELLANEOUS) ×2 IMPLANT
CATH ROBINSON RED A/P 16FR (CATHETERS) ×2 IMPLANT
CLOTH BEACON ORANGE TIMEOUT ST (SAFETY) ×2 IMPLANT
CONTAINER PREFILL 10% NBF 60ML (FORM) ×4 IMPLANT
DRAPE UTILITY XL STRL (DRAPES) IMPLANT
ELECT REM PT RETURN 9FT ADLT (ELECTROSURGICAL)
ELECTRODE REM PT RTRN 9FT ADLT (ELECTROSURGICAL) IMPLANT
GLOVE BIO SURGEON STRL SZ8 (GLOVE) ×2 IMPLANT
GLOVE SURG ORTHO 8.0 STRL STRW (GLOVE) ×2 IMPLANT
GOWN PREVENTION PLUS LG XLONG (DISPOSABLE) ×2 IMPLANT
LOOP ANGLED CUTTING 22FR (CUTTING LOOP) IMPLANT
PACK HYSTEROSCOPY LF (CUSTOM PROCEDURE TRAY) ×2 IMPLANT
TOWEL OR 17X24 6PK STRL BLUE (TOWEL DISPOSABLE) ×4 IMPLANT
WATER STERILE IRR 1000ML POUR (IV SOLUTION) IMPLANT

## 2011-09-07 NOTE — Transfer of Care (Signed)
Immediate Anesthesia Transfer of Care Note  Patient: [name]  Procedure(s) Performed:  DILATATION AND CURETTAGE (D&C) /HYSTEROSCOPY - uterus  Patient Location: PACU  Anesthesia Type: MAC  Level of Consciousness: awake, alert  and oriented  Airway & Oxygen Therapy: Patient Spontanous Breathing  Post-op Assessment: Report given to PACU RN and Post -op Vital signs reviewed and stable  Post vital signs: Reviewed and stable  Complications: No apparent anesthesia complications

## 2011-09-07 NOTE — Progress Notes (Signed)
Pt took her am meds last night... Dr. Jean Rosenthal aware and will discuss with dr. Malen Gauze.

## 2011-09-07 NOTE — OR Nursing (Signed)
The support person of this patient was asked if she has power of attorney.She said that a grand child did but she was not in building. I discussed this with short stay RN,Anesthiologist and surgeon. Surgeon spoke with support person who was the daughter and they agreed that patient understood she needed surgery and that it was ok to proceed.

## 2011-09-07 NOTE — Anesthesia Postprocedure Evaluation (Signed)
  Anesthesia Post-op Note  Patient: [name]  Procedure(s) Performed:  DILATATION AND CURETTAGE (D&C) /HYSTEROSCOPY - uterus  Patient Location: PACU  Anesthesia Type: MAC  Level of Consciousness: awake, alert  and oriented  Airway and Oxygen Therapy: Patient Spontanous Breathing  Post-op Pain: none  Post-op Assessment: Post-op Vital signs reviewed, Patient's Cardiovascular Status Stable, Respiratory Function Stable, Patent Airway, No signs of Nausea or vomiting and Pain level controlled  Post-op Vital Signs: Reviewed and stable  Complications: No apparent anesthesia complications

## 2011-09-07 NOTE — Op Note (Signed)
#  [id number] dictation D&C EM mass and stenotic Cx Speciman:  Polyp and EM currettings EBL minimal Glycine def 65cc No complicatinos Surgeon:  Rana Snare Anesthesia : Gen and paracx block DL

## 2011-09-11 ENCOUNTER — Encounter (HOSPITAL_COMMUNITY): Payer: Self-pay | Admitting: Obstetrics and Gynecology

## 2011-09-11 NOTE — Op Note (Signed)
[name], [name]               ACCOUNT NO.:  [id number]  MEDICAL RECORD NO.:  [id number]  LOCATION:                                 FACILITY:  PHYSICIAN:  Dineen Kid. Rana Snare, M.D.    DATE OF BIRTH:  July 13, 1918  DATE OF PROCEDURE:  09/07/2011 DATE OF DISCHARGE:                              OPERATIVE REPORT   PREOPERATIVE DIAGNOSES:  Postmenopausal endometrial mass and pyometra and stenotic cervix.  POSTOPERATIVE DIAGNOSES:  Postmenopausal endometrial mass and pyometra and stenotic cervix.  PROCEDURE:  Hysteroscopy D and C, polypectomy.  SURGEON:  Dineen Kid. Rana Snare, MD  ANESTHESIA:  General by mask and paracervical block.  INDICATIONS:  [name] is a 77 year old postmenopausal woman who was sent to me for further evaluation of pelvic discomfort.  She underwent an ultrasound evaluation show a normal-appearing anatomy except she had fluid noted within her endometrial cavity consistent with pyometrium and an elongated mass within the endometrial cavity consistent with an endometrial polyp.  She presents today for definitive surgical evaluation and treatment of this.  The surgery had been delayed for 3 months so that she can get off her Plavix due to her history of cardiac stent.  Risks and benefits of procedure were discussed at length with her and her daughter and she does give her informed consent.  FINDINGS:  At the time of surgery, small amount of endometrial fluid with small endometrial polyp, otherwise normal-appearing endometrial cavity and ostia.  DESCRIPTION OF PROCEDURE:  After adequate analgesia, the patient was placed in a dorsal lithotomy position.  She was sterilely prepped and draped.  Bladder was sterilely drained.  A speculum was placed in the complexity of the cervix.  Paracervical block was placed.  1% Xylocaine 1:100,000 epinephrine, total 20 mL used.  The cervix was identified by the dimple in the mid portion at the base of the cervix.  Lacrimal probe duct  was used to identify and open the external portion of the cervix. This was followed by a small plastic Milex dilator into the endocervix and followed by Shawnie Pons dilators.  She was then dilated to a #27 Pratt dilator, and uterus was sounded to 7 cm.  Hysteroscope was then inserted.  The above findings were then noted.  Gentle sharp curettage was performed retrieving the fragments of the endometrial polyp and endometrial lining.  This was performed until a gritty surface was felt throughout the endometrial cavity.  Reexamination with a hysteroscope revealed normal-appearing cavity, normal-appearing ostia, and cervix with no residual polyps.  The hysteroscope was then removed.  Tenaculum was placed into anterior lip of the cervix.  Monsel's and Q-tip was used to achieve hemostasis.  Speculum was then removed.  The patient was transferred to recovery room in a stable condition.  Instrument, sponge, and needle count was normal x3.  The patient received 1 g of cefotetan preoperatively.  The glycine deficit was 65 mL.  DISPOSITION:  The patient will be discharged home and will follow up in the office in 2-3 weeks on the routine instruction for D and C, told to return for increased pain, fever, or bleeding.     Dineen Kid Rana Snare, M.D.  DCL/MEDQ  D:  09/07/2011  T:  09/07/2011  Job:  [id number]

## 2012-01-08 ENCOUNTER — Other Ambulatory Visit (HOSPITAL_COMMUNITY): Payer: Self-pay | Admitting: Cardiology

## 2012-01-19 ENCOUNTER — Encounter (HOSPITAL_COMMUNITY)
Admission: RE | Admit: 2012-01-19 | Discharge: 2012-01-19 | Disposition: A | Discharge status: Home / Self Care | Payer: Medicare Other | Source: Ambulatory Visit | Attending: Cardiology | Admitting: Cardiology

## 2012-01-19 DIAGNOSIS — R079 Chest pain, unspecified: Secondary | ICD-10-CM | POA: Insufficient documentation

## 2012-01-19 DIAGNOSIS — R9439 Abnormal result of other cardiovascular function study: Secondary | ICD-10-CM | POA: Insufficient documentation

## 2012-01-19 DIAGNOSIS — I4949 Other premature depolarization: Secondary | ICD-10-CM | POA: Insufficient documentation

## 2012-01-19 DIAGNOSIS — R0602 Shortness of breath: Secondary | ICD-10-CM | POA: Insufficient documentation

## 2012-01-19 MED ORDER — TECHNETIUM TC 99M TETROFOSMIN IV KIT
10.0000 | PACK | Freq: Once | INTRAVENOUS | Status: AC | PRN
  Administered 2012-01-19: 10 via INTRAVENOUS

## 2012-01-19 MED ORDER — REGADENOSON 0.4 MG/5ML IV SOLN
INTRAVENOUS | Status: AC
  Filled 2012-01-19: qty 5

## 2012-01-19 MED ORDER — TECHNETIUM TC 99M TETROFOSMIN IV KIT
30.0000 | PACK | Freq: Once | INTRAVENOUS | Status: AC | PRN
  Administered 2012-01-19: 30 via INTRAVENOUS

## 2012-01-19 MED ORDER — REGADENOSON 0.4 MG/5ML IV SOLN
0.4000 mg | Freq: Once | INTRAVENOUS | Status: AC
  Administered 2012-01-19: 0.4 mg via INTRAVENOUS

## 2012-01-19 NOTE — Progress Notes (Signed)
Pt in today for nuclear medicine cardiac stress test with meyoview.  Test started at 1106 and completed at 1120.  Pt tolerated well with complaint of mild to moderate chest tightness and pressure, shortess of breath, nausea and shoulder pain.  All symptoms resolved by exam end.  Dr. [name] at bedside at start and throughout exam.

## 2012-01-24 ENCOUNTER — Other Ambulatory Visit: Payer: Self-pay | Admitting: Cardiovascular Disease

## 2012-01-24 DIAGNOSIS — Z01811 Encounter for preprocedural respiratory examination: Secondary | ICD-10-CM

## 2012-01-25 ENCOUNTER — Ambulatory Visit (HOSPITAL_COMMUNITY)
Admission: RE | Admit: 2012-01-25 | Discharge: 2012-01-25 | Disposition: A | Discharge status: Home / Self Care | Payer: Medicare Other | Source: Ambulatory Visit | Attending: Cardiology | Admitting: Cardiology

## 2012-01-25 ENCOUNTER — Other Ambulatory Visit: Payer: Self-pay

## 2012-01-25 ENCOUNTER — Encounter (HOSPITAL_COMMUNITY)
Admission: RE | Disposition: A | Discharge status: Home / Self Care | Payer: Self-pay | Source: Ambulatory Visit | Attending: Cardiology

## 2012-01-25 DIAGNOSIS — I059 Rheumatic mitral valve disease, unspecified: Secondary | ICD-10-CM | POA: Insufficient documentation

## 2012-01-25 DIAGNOSIS — I252 Old myocardial infarction: Secondary | ICD-10-CM | POA: Insufficient documentation

## 2012-01-25 DIAGNOSIS — R9439 Abnormal result of other cardiovascular function study: Secondary | ICD-10-CM | POA: Insufficient documentation

## 2012-01-25 DIAGNOSIS — R0989 Other specified symptoms and signs involving the circulatory and respiratory systems: Secondary | ICD-10-CM | POA: Insufficient documentation

## 2012-01-25 DIAGNOSIS — E7800 Pure hypercholesterolemia, unspecified: Secondary | ICD-10-CM | POA: Insufficient documentation

## 2012-01-25 DIAGNOSIS — I10 Essential (primary) hypertension: Secondary | ICD-10-CM | POA: Insufficient documentation

## 2012-01-25 DIAGNOSIS — R0609 Other forms of dyspnea: Secondary | ICD-10-CM | POA: Insufficient documentation

## 2012-01-25 DIAGNOSIS — I209 Angina pectoris, unspecified: Secondary | ICD-10-CM | POA: Insufficient documentation

## 2012-01-25 DIAGNOSIS — Z950 Presence of cardiac pacemaker: Secondary | ICD-10-CM | POA: Insufficient documentation

## 2012-01-25 DIAGNOSIS — I2510 Atherosclerotic heart disease of native coronary artery without angina pectoris: Secondary | ICD-10-CM | POA: Insufficient documentation

## 2012-01-25 DIAGNOSIS — M1990 Unspecified osteoarthritis, unspecified site: Secondary | ICD-10-CM | POA: Insufficient documentation

## 2012-01-25 DIAGNOSIS — Z01811 Encounter for preprocedural respiratory examination: Secondary | ICD-10-CM

## 2012-01-25 HISTORY — PX: LEFT HEART CATHETERIZATION WITH CORONARY ANGIOGRAM: SHX5451

## 2012-01-25 LAB — CBC
HCT: 37.4 % (ref 36.0–46.0)
Hemoglobin: 12.6 g/dL (ref 12.0–15.0)
MCH: 30.7 pg (ref 26.0–34.0)
MCHC: 33.7 g/dL (ref 30.0–36.0)
MCV: 91 fL (ref 78.0–100.0)
Platelets: 201 10*3/uL (ref 150–400)
RBC: 4.11 MIL/uL (ref 3.87–5.11)
RDW: 14.3 % (ref 11.5–15.5)
WBC: 5.6 10*3/uL (ref 4.0–10.5)

## 2012-01-25 LAB — BASIC METABOLIC PANEL
BUN: 25 mg/dL — ABNORMAL HIGH (ref 6–23)
CO2: 26 mEq/L (ref 19–32)
Calcium: 9.6 mg/dL (ref 8.4–10.5)
Chloride: 108 mEq/L (ref 96–112)
Creatinine, Ser: 0.83 mg/dL (ref 0.50–1.10)
GFR calc Af Amer: 69 mL/min — ABNORMAL LOW (ref >90)
GFR calc non Af Amer: 60 mL/min — ABNORMAL LOW (ref >90)
Glucose, Bld: 87 mg/dL (ref 70–99)
Potassium: 4 mEq/L (ref 3.5–5.1)
Sodium: 143 mEq/L (ref 135–145)

## 2012-01-25 LAB — PROTIME-INR
INR: 1.36 (ref 0.00–1.49)
Prothrombin Time: 17 seconds — ABNORMAL HIGH (ref 11.6–15.2)

## 2012-01-25 SURGERY — LEFT HEART CATHETERIZATION WITH CORONARY ANGIOGRAM
Anesthesia: LOCAL

## 2012-01-25 MED ORDER — OXYCODONE-ACETAMINOPHEN 5-325 MG PO TABS: 1.0000 | ORAL_TABLET | ORAL | Status: DC | PRN

## 2012-01-25 MED ORDER — ONDANSETRON HCL 4 MG/2ML IJ SOLN: 4.0000 mg | Freq: Four times a day (QID) | INTRAMUSCULAR | Status: DC | PRN

## 2012-01-25 MED ORDER — SODIUM CHLORIDE 0.9 % IV SOLN: INTRAVENOUS | Status: AC

## 2012-01-25 MED ORDER — ATORVASTATIN CALCIUM 10 MG PO TABS: 10.0000 mg | ORAL_TABLET | Freq: Every day | ORAL | Status: DC

## 2012-01-25 MED ORDER — NITROGLYCERIN 0.2 MG/HR TD PT24: 0.2000 mg | MEDICATED_PATCH | Freq: Every day | TRANSDERMAL | Status: DC

## 2012-01-25 MED ORDER — SODIUM CHLORIDE 0.9 % IJ SOLN: 3.0000 mL | INTRAMUSCULAR | Status: DC | PRN

## 2012-01-25 MED ORDER — LIDOCAINE HCL (PF) 1 % IJ SOLN
INTRAMUSCULAR | Status: AC
  Filled 2012-01-25: qty 30

## 2012-01-25 MED ORDER — ASPIRIN 81 MG PO CHEW
324.0000 mg | CHEWABLE_TABLET | ORAL | Status: AC
  Administered 2012-01-25: 324 mg via ORAL
  Filled 2012-01-25: qty 4

## 2012-01-25 MED ORDER — DIAZEPAM 5 MG PO TABS
5.0000 mg | ORAL_TABLET | ORAL | Status: AC
  Administered 2012-01-25: 5 mg via ORAL
  Filled 2012-01-25: qty 1

## 2012-01-25 MED ORDER — SODIUM CHLORIDE 0.9 % IV SOLN
INTRAVENOUS | Status: DC
  Administered 2012-01-25: 08:00:00 via INTRAVENOUS

## 2012-01-25 MED ORDER — NITROGLYCERIN 0.2 MG/ML ON CALL CATH LAB
INTRAVENOUS | Status: AC
  Filled 2012-01-25: qty 1

## 2012-01-25 MED ORDER — RAMIPRIL 10 MG PO CAPS: 10.0000 mg | ORAL_CAPSULE | Freq: Two times a day (BID) | ORAL | Status: DC

## 2012-01-25 MED ORDER — SODIUM CHLORIDE 0.9 % IJ SOLN: 3.0000 mL | Freq: Two times a day (BID) | INTRAMUSCULAR | Status: DC

## 2012-01-25 MED ORDER — HEPARIN (PORCINE) IN NACL 2-0.9 UNIT/ML-% IJ SOLN
INTRAMUSCULAR | Status: AC
  Filled 2012-01-25: qty 2000

## 2012-01-25 MED ORDER — FAMOTIDINE 20 MG PO TABS: 20.0000 mg | ORAL_TABLET | Freq: Two times a day (BID) | ORAL | Status: DC

## 2012-01-25 MED ORDER — SODIUM CHLORIDE 0.9 % IV SOLN: INTRAVENOUS | Status: DC

## 2012-01-25 MED ORDER — FAMOTIDINE IN NACL 20-0.9 MG/50ML-% IV SOLN: 20.0000 mg | INTRAVENOUS | Status: DC

## 2012-01-25 MED ORDER — ACETAMINOPHEN 325 MG PO TABS: 650.0000 mg | ORAL_TABLET | ORAL | Status: DC | PRN

## 2012-01-25 MED ORDER — ASPIRIN 81 MG PO CHEW: 81.0000 mg | CHEWABLE_TABLET | Freq: Every day | ORAL | Status: DC

## 2012-01-25 MED ORDER — CARVEDILOL 3.125 MG PO TABS: 6.2500 mg | ORAL_TABLET | Freq: Two times a day (BID) | ORAL | Status: DC

## 2012-01-25 MED ORDER — SODIUM CHLORIDE 0.9 % IV SOLN: 250.0000 mL | INTRAVENOUS | Status: DC | PRN

## 2012-01-25 MED ORDER — METHYLPREDNISOLONE SODIUM SUCC 125 MG IJ SOLR: 60.0000 mg | INTRAMUSCULAR | Status: DC

## 2012-01-25 MED ORDER — ASPIRIN 81 MG PO TBEC: 81.0000 mg | DELAYED_RELEASE_TABLET | Freq: Every day | ORAL | Status: DC

## 2012-01-25 NOTE — Discharge Instructions (Signed)
Coronary Angiography  Coronary angiography is an X-ray procedure used to look at the arteries in the heart. In this procedure, a dye is injected through a long, hollow tube (catheter). The catheter is about the size of a piece of cooked spaghetti. The catheter injects a dye into an artery in your groin. X-rays are then taken to show if there is a blockage in the arteries of your heart.  BEFORE THE PROCEDURE     Let your caregiver know if you have allergies to shellfish or contrast dye. Also let your caregiver know if you have kidney problems or failure.   Do not eat or drink starting from midnight up to the time of the procedure, or as directed.   You may drink enough water to take your medications the morning of the procedure if you were instructed to do so.   You should be at the hospital or outpatient facility where the procedure is to be done 60 minutes prior to the procedure or as directed.  PROCEDURE   You may be given an IV medication to help you relax before the procedure.   You will be prepared for the procedure by washing and shaving the area where the catheter will be inserted. This is usually done in the groin but may be done in the fold of your arm by your elbow.   A medicine will be given to numb your groin where the catheter will be inserted.   A specially trained doctor will insert the catheter into an artery in your groin. The catheter is guided by using a special type of X-ray (fluoroscopy) to the blood vessel being examined.   A special dye is then injected into the catheter and X-rays are taken. The dye helps to show where any narrowing or blockages are located in the heart arteries.  AFTER THE PROCEDURE     After the procedure you will be kept in bed lying flat for several hours. You will be instructed to not bend or cross your legs.   The groin insertion site will be watched and checked frequently.   The pulse in your feet will be checked frequently.   Additional blood tests,  X-rays and an EKG may be done.   You may stay in the hospital overnight for observation.  SEEK IMMEDIATE MEDICAL CARE IF:     You develop chest pain, shortness of breath, feel faint, or pass out.   There is bleeding, swelling, or drainage from the catheter insertion site.   You develop pain, discoloration, coldness, or severe bruising in the leg or area where the catheter was inserted.   You have a fever.  Document Released: 04/22/2003 Document Revised: 10/05/2011 Document Reviewed: 06/10/2008  [employer] Patient Information 2012 [employer]

## 2012-01-25 NOTE — H&P (Signed)
  Handwritten H&P in the chart no changes 

## 2012-01-25 NOTE — CV Procedure (Signed)
Cardiac catheterization report dictated on 01/25/2012 dictation number is [id number]

## 2012-01-25 NOTE — Cardiovascular Report (Signed)
[name], [name]               ACCOUNT NO.:  [id number]  MEDICAL RECORD NO.:  [id number]  LOCATION:  [facility]                         FACILITY:  [facility]  PHYSICIAN:  Maxmillian Carsey N. [name], M.D. DATE OF BIRTH:  06/05/1918  DATE OF PROCEDURE:  01/25/2012 DATE OF DISCHARGE:  01/25/2012                           CARDIAC CATHETERIZATION   PROCEDURE:  Left cardiac catheterization with selective left and right coronary angiography, left ventriculography via right groin using Judkins technique.  INDICATION FOR THE PROCEDURE:  [name] is 78 year old black female with past medical history significant for coronary artery disease, history of anteroseptal wall myocardial infarction in September 2011, status post PCI to LAD.  Subsequently, had high-grade AV block, requiring permanent pacemaker.  Hypertension, hypercholesteremia, degenerative joint disease, history of diverticular bleeding in the past, history of glaucoma of the eyes, complains of vague right-sided chest pain, shoulder and arm pain off and on associated with exertional dyspnea.  The patient underwent Lexiscan Myoview on January 19, 2012, which showed mid-to-distal anterolateral wall and proximal inferolateral wall ischemia with EF of 68%.  The patient denies any palpitation, lightheadedness, or syncope.  Denies PND, orthopnea, or leg swelling. Denies cough, fever, or chills.  Due to typical anginal chest pain, positive Lexiscan Myoview and multiple risk factors, discussed with the patient regarding left cath, possible PTCA stenting, its risks and benefits, i.e., death, MI, stroke, need for emergency CABG, local vascular complications, etc., and consented for PCI.  PROCEDURE:  After obtaining the informed consent, the patient was brought to the Cath Lab and was placed on fluoroscopy table.  Right groin was prepped and draped in usual fashion.  Xylocaine 1% was used for local anesthesia in the right groin.  With the help of thin  wall needle, 6-French arterial sheath was placed.  The sheath was aspirated and flushed.  Next, 6-French left Judkins catheter was advanced over the wire under fluoroscopic guidance up to the ascending aorta.  Wire was pulled out.  The catheter was aspirated and connected to the Manifold. Catheter was further advanced and engaged into left coronary ostium. Multiple views of the left system were taken.  Next, the catheter was disengaged and was pulled out over the wire and was replaced with 6- [place] right Judkins catheter, which was advanced over the wire under fluoroscopic guidance up to the ascending aorta.  Wire was pulled out, the catheter was aspirated and connected to the Manifold.  Catheter was further advanced and engaged into right coronary ostium.  Multiple views of the right system were taken.  Next, the catheter was disengaged and was pulled out over the wire and was replaced with 6-French pigtail catheter, which was advanced over the wire under fluoroscopic guidance up to the ascending aorta.  Wire was pulled out, the catheter was aspirated and connected to the Manifold.  Catheter was further advanced across the aortic valve into the LV.  LV pressures were recorded.  Next, the left ventriculography was done in 30-degree RAO position.  Post- angiographic pressures were recorded from LV and then pullback pressures were recorded from the aorta.  There was no significant gradient across the aortic valve.  Next, Pigtail catheter was pulled out over the  wire, sheaths were aspirated and flushed.  FINDINGS:  LV showed good LV systolic function.  EF of 55-60%.  There was 4+ MR.  Left main had 5-10% distal stenosis.  LAD has 20-25% proximal stenosis just proximal to the stent with haziness, stented midportion is widely patent with TIMI grade 3 distal flow.  Diagonal 1 and 2 were very small, which were patent.  Left circumflex has 10-15% proximal stenosis and 30-40% mid stenosis and  75-80% distal stenosis with aneurysmal dilatation.  OM 1 is moderate size, which has 10-20% stenosis.  OM 2 is very small.  OM 3 and 4 are small, which are patent. RCA has 10-15% mid stenosis and 15-20% distal stenosis.  PDA and PLV branches were small, which were patent.  The patient tolerated the procedure well.  There were no complications.  Plan is to start the patient on Plavix as outpatient.  The patient states she stopped also her aspirin, so we will restart her on aspirin and Plavix if she is able to tolerate dual antiplatelet medication.  We will bring her back in a week or 2 for PCI to left circumflex.  Discussed with the patient's family and they agree with above plan and the patient will be discharged home today and will be followed up in my office early next week.     Eduardo Osier. [name], M.D.     MNH/MEDQ  D:  01/25/2012  T:  01/25/2012  Job:  [id number]

## 2012-02-13 ENCOUNTER — Encounter (HOSPITAL_COMMUNITY)
Admission: RE | Disposition: A | Discharge status: Home / Self Care | Payer: Self-pay | Source: Ambulatory Visit | Attending: Cardiology

## 2012-02-13 ENCOUNTER — Ambulatory Visit (HOSPITAL_COMMUNITY)
Admission: RE | Admit: 2012-02-13 | Discharge: 2012-02-14 | Disposition: A | Discharge status: Home / Self Care | Payer: Medicare Other | Source: Ambulatory Visit | Attending: Cardiology | Admitting: Cardiology

## 2012-02-13 ENCOUNTER — Encounter (HOSPITAL_COMMUNITY): Payer: Self-pay | Admitting: General Practice

## 2012-02-13 DIAGNOSIS — Z950 Presence of cardiac pacemaker: Secondary | ICD-10-CM | POA: Insufficient documentation

## 2012-02-13 DIAGNOSIS — I2510 Atherosclerotic heart disease of native coronary artery without angina pectoris: Secondary | ICD-10-CM | POA: Insufficient documentation

## 2012-02-13 DIAGNOSIS — E7800 Pure hypercholesterolemia, unspecified: Secondary | ICD-10-CM | POA: Insufficient documentation

## 2012-02-13 DIAGNOSIS — Z9861 Coronary angioplasty status: Secondary | ICD-10-CM | POA: Insufficient documentation

## 2012-02-13 DIAGNOSIS — I200 Unstable angina: Secondary | ICD-10-CM | POA: Insufficient documentation

## 2012-02-13 DIAGNOSIS — I252 Old myocardial infarction: Secondary | ICD-10-CM | POA: Insufficient documentation

## 2012-02-13 DIAGNOSIS — I10 Essential (primary) hypertension: Secondary | ICD-10-CM | POA: Insufficient documentation

## 2012-02-13 HISTORY — DX: Presence of cardiac pacemaker: Z95.0

## 2012-02-13 HISTORY — PX: PERCUTANEOUS CORONARY STENT INTERVENTION (PCI-S): SHX5485

## 2012-02-13 HISTORY — PX: CORONARY ANGIOPLASTY WITH STENT PLACEMENT: SHX49

## 2012-02-13 SURGERY — PERCUTANEOUS CORONARY STENT INTERVENTION (PCI-S)
Anesthesia: LOCAL

## 2012-02-13 MED ORDER — ASPIRIN 81 MG PO TBEC: 81.0000 mg | DELAYED_RELEASE_TABLET | Freq: Every day | ORAL | Status: DC

## 2012-02-13 MED ORDER — ASPIRIN 81 MG PO CHEW
CHEWABLE_TABLET | ORAL | Status: AC
  Administered 2012-02-13: 324 mg via ORAL
  Filled 2012-02-13: qty 4

## 2012-02-13 MED ORDER — BIMATOPROST 0.01 % OP SOLN
1.0000 [drp] | Freq: Every day | OPHTHALMIC | Status: DC
  Administered 2012-02-13: 1 [drp] via OPHTHALMIC
  Filled 2012-02-13: qty 2.5

## 2012-02-13 MED ORDER — SODIUM CHLORIDE 0.9 % IV SOLN: 250.0000 mL | INTRAVENOUS | Status: DC | PRN

## 2012-02-13 MED ORDER — ASPIRIN 81 MG PO CHEW
324.0000 mg | CHEWABLE_TABLET | ORAL | Status: AC
Start: 2012-02-14 — End: 2012-02-13
  Administered 2012-02-13: 324 mg via ORAL

## 2012-02-13 MED ORDER — FAMOTIDINE 20 MG PO TABS
20.0000 mg | ORAL_TABLET | Freq: Two times a day (BID) | ORAL | Status: DC
  Administered 2012-02-13 – 2012-02-14 (×2): 20 mg via ORAL
  Filled 2012-02-13 (×3): qty 1

## 2012-02-13 MED ORDER — DORZOLAMIDE HCL 2 % OP SOLN
1.0000 [drp] | Freq: Three times a day (TID) | OPHTHALMIC | Status: DC
  Administered 2012-02-13 – 2012-02-14 (×2): 1 [drp] via OPHTHALMIC
  Filled 2012-02-13: qty 10

## 2012-02-13 MED ORDER — ATORVASTATIN CALCIUM 20 MG PO TABS
20.0000 mg | ORAL_TABLET | Freq: Every day | ORAL | Status: DC
  Administered 2012-02-13: 20 mg via ORAL
  Filled 2012-02-13 (×2): qty 1

## 2012-02-13 MED ORDER — ONDANSETRON HCL 4 MG/2ML IJ SOLN: 4.0000 mg | Freq: Four times a day (QID) | INTRAMUSCULAR | Status: DC | PRN

## 2012-02-13 MED ORDER — ACETAMINOPHEN 325 MG PO TABS: 650.0000 mg | ORAL_TABLET | ORAL | Status: DC | PRN

## 2012-02-13 MED ORDER — NITROGLYCERIN IN D5W 200-5 MCG/ML-% IV SOLN
10.0000 ug/min | INTRAVENOUS | Status: DC
  Administered 2012-02-13: 10 ug/min via INTRAVENOUS

## 2012-02-13 MED ORDER — RAMIPRIL 5 MG PO CAPS
5.0000 mg | ORAL_CAPSULE | Freq: Two times a day (BID) | ORAL | Status: DC
  Administered 2012-02-13 – 2012-02-14 (×2): 5 mg via ORAL
  Filled 2012-02-13 (×3): qty 1

## 2012-02-13 MED ORDER — HEPARIN (PORCINE) IN NACL 2-0.9 UNIT/ML-% IJ SOLN
INTRAMUSCULAR | Status: AC
  Filled 2012-02-13: qty 2000

## 2012-02-13 MED ORDER — ASPIRIN EC 81 MG PO TBEC
81.0000 mg | DELAYED_RELEASE_TABLET | Freq: Every day | ORAL | Status: DC
  Administered 2012-02-14: 81 mg via ORAL
  Filled 2012-02-13: qty 1

## 2012-02-13 MED ORDER — CARVEDILOL 6.25 MG PO TABS
6.2500 mg | ORAL_TABLET | Freq: Two times a day (BID) | ORAL | Status: DC
  Administered 2012-02-13 – 2012-02-14 (×2): 6.25 mg via ORAL
  Filled 2012-02-13 (×4): qty 1

## 2012-02-13 MED ORDER — FOLIC ACID 1 MG PO TABS
1.0000 mg | ORAL_TABLET | Freq: Every day | ORAL | Status: DC
  Administered 2012-02-13 – 2012-02-14 (×2): 1 mg via ORAL
  Filled 2012-02-13 (×2): qty 1

## 2012-02-13 MED ORDER — SODIUM CHLORIDE 0.9 % IV SOLN
INTRAVENOUS | Status: DC
  Administered 2012-02-13: 1000 mL via INTRAVENOUS

## 2012-02-13 MED ORDER — PREDNISOLONE ACETATE 1 % OP SUSP
2.0000 [drp] | Freq: Four times a day (QID) | OPHTHALMIC | Status: DC
  Administered 2012-02-13 – 2012-02-14 (×3): 2 [drp] via OPHTHALMIC
  Filled 2012-02-13: qty 1

## 2012-02-13 MED ORDER — FAMOTIDINE IN NACL 20-0.9 MG/50ML-% IV SOLN
INTRAVENOUS | Status: AC
  Filled 2012-02-13: qty 50

## 2012-02-13 MED ORDER — BIVALIRUDIN 250 MG IV SOLR
INTRAVENOUS | Status: AC
  Filled 2012-02-13: qty 250

## 2012-02-13 MED ORDER — NITROGLYCERIN IN D5W 200-5 MCG/ML-% IV SOLN
INTRAVENOUS | Status: AC
  Filled 2012-02-13: qty 250

## 2012-02-13 MED ORDER — BIMATOPROST 0.03 % OP SOLN
1.0000 [drp] | Freq: Every day | OPHTHALMIC | Status: DC
  Filled 2012-02-13: qty 2.5

## 2012-02-13 MED ORDER — SODIUM CHLORIDE 0.9 % IJ SOLN: 3.0000 mL | INTRAMUSCULAR | Status: DC | PRN

## 2012-02-13 MED ORDER — SODIUM CHLORIDE 0.9 % IJ SOLN: 3.0000 mL | Freq: Two times a day (BID) | INTRAMUSCULAR | Status: DC

## 2012-02-13 MED ORDER — NITROGLYCERIN 0.2 MG/ML ON CALL CATH LAB
INTRAVENOUS | Status: AC
  Filled 2012-02-13: qty 1

## 2012-02-13 MED ORDER — SODIUM CHLORIDE 0.9 % IV SOLN: INTRAVENOUS | Status: AC

## 2012-02-13 MED ORDER — CLOPIDOGREL BISULFATE 300 MG PO TABS
ORAL_TABLET | ORAL | Status: AC
  Administered 2012-02-13: 300 mg via ORAL
  Filled 2012-02-13: qty 1

## 2012-02-13 MED ORDER — CLOPIDOGREL BISULFATE 75 MG PO TABS
75.0000 mg | ORAL_TABLET | Freq: Every day | ORAL | Status: DC
  Administered 2012-02-14: 75 mg via ORAL
  Filled 2012-02-13: qty 1

## 2012-02-13 MED ORDER — LIDOCAINE HCL (PF) 1 % IJ SOLN
INTRAMUSCULAR | Status: AC
  Filled 2012-02-13: qty 30

## 2012-02-13 MED ORDER — DIAZEPAM 5 MG PO TABS: 5.0000 mg | ORAL_TABLET | ORAL | Status: DC

## 2012-02-13 MED ORDER — INSULIN ASPART 100 UNIT/ML ~~LOC~~ SOLN: 0.0000 [IU] | Freq: Three times a day (TID) | SUBCUTANEOUS | Status: DC

## 2012-02-13 MED ORDER — NEPAFENAC 0.1 % OP SUSP
3.0000 [drp] | Freq: Three times a day (TID) | OPHTHALMIC | Status: DC
  Administered 2012-02-13 – 2012-02-14 (×2): 3 [drp] via OPHTHALMIC
  Filled 2012-02-13: qty 3

## 2012-02-13 MED ORDER — CLOPIDOGREL BISULFATE 75 MG PO TABS
300.0000 mg | ORAL_TABLET | ORAL | Status: AC
  Administered 2012-02-13: 300 mg via ORAL

## 2012-02-13 NOTE — CV Procedure (Signed)
PTCA stenting report dictated on 02/13/2012 dictation number is [id number]

## 2012-02-13 NOTE — H&P (Signed)
  Updated handwritten H&P in the chart

## 2012-02-13 NOTE — Cardiovascular Report (Signed)
[name], [name]               ACCOUNT NO.:  [id number]  MEDICAL RECORD NO.:  [id number]  LOCATION:  [facility]                         FACILITY:  [facility]  PHYSICIAN:  Sael Furches N. [name], M.D. DATE OF BIRTH:  April 23, 1918  DATE OF PROCEDURE:  02/13/2012 DATE OF DISCHARGE:                           CARDIAC CATHETERIZATION   PROCEDURES: 1. Successful percutaneous transluminal coronary angioplasty to the     distal left circumflex using 2.0 x 12 mm long Emerge balloon. 2. Successful deployment of 2.75 x20 mm long Promus Element drug-     eluting stent. 3. Successful postdilatation of the stent using 2.75 x 15 mm long [place]     Quantum Apex balloon going up to 18 atmospheric pressure. 4. Successful postdilatation of aneurysmal segment in the distal left     circumflex using [place] Quantum 3.0 x 6 mm long going up to 15     atmospheric pressure.  INDICATION FOR THE PROCEDURE:  [name] is a 78 year old black female with past medical history significant for coronary artery disease, history of anteroseptal wall myocardial infarction in September 2011, status post PTCA stenting to mid LAD, subsequently had high-grade AV block requiring permanent pacemaker, history of hypertension, hypercholesteremia, degenerative joint disease, history of diverticular bleed in the past, history of glaucoma of the eyes.  Complains of vague right-sided chest pain, shoulder pain, and arm pain off and on associated with exertional dyspnea.  The patient underwent Lexiscan Myoview on January 19, 2012, which showed mid to distal anterolateral wall and proximal inferolateral wall ischemia with EF of 68%.  The patient denies any palpitation, lightheadedness, or syncope.  Denies PND, orthopnea, or leg swelling.  Denies cough, fever, or chills.  Due to typical anginal chest pain, positive Lexiscan Myoview, and multiple risk factors, the patient subsequently underwent left cardiac cath with selective left and right coronary  angiography on January 24, 2002.  The patient tolerated the procedure well and was discharged home.  The patient was started on Plavix as outpatient and restarted on aspirin which she has been tolerating for approximately 2 weeks without any problems.  The patient is brought back to the hospital for PCI to left circumflex.  PROCEDURE:  After obtaining the informed consent, the patient was brought to the cath lab and was placed on the fluoroscopy table.  Right groin was prepped and draped in the usual fashion.  Xylocaine 1% was used for local anesthesia in the right groin.  With the help of thin wall needle, a 6-French arterial sheath was placed.  The sheath was aspirated and flushed.  Next, 6-French left [place] guiding catheter was advanced over the wire under fluoroscopic guidance up to the ascending aorta.  Wire was pulled out.  The catheter was aspirated and connected to the manifold.  Catheter was further advanced and engaged into left coronary ostium.  Multiple views of the left system were taken.  FINDINGS:  As above.  The patient had critical left circumflex distal stenosis in the range of 75-80% as before and 30-40% mid stenosis.  INTERVENTIONAL PROCEDURE:  Successful PTCA to distal left circumflex was done using 2.0 x 12 mm long Emerge balloon for predilatation and then 2.75 x 20  mm long Promus Element drug-eluting stent was deployed in distal left circumflex at 11 atmospheric pressure.  Stent was postdilated using 2.75 x 15 mm long [place] Quantum Apex balloon going up to 18-20 atmospheric pressure.  The aneurysmal portion was further postdilated using 3.0 x 6 mm long [place] Quantum Apex balloon going up to 15 atmospheric pressure.  Lesions were dilated from 75-80% to 0% residual with excellent TIMI grade 3 distal flow without evidence of dissection or distal embolization.  The patient received weight-based Angiomax and 300 mg of Plavix prior to the procedure.  The patient tolerated the  procedure well.  There were no complications.  The patient was transferred to the recovery room in stable condition.     Eduardo Osier. [name], M.D.     [facility]MED[facility]  D:  02/13/2012  T:  02/13/2012  Job:  [id number]

## 2012-02-14 LAB — GLUCOSE, CAPILLARY
Glucose-Capillary: 113 mg/dL — ABNORMAL HIGH (ref 70–99)
Glucose-Capillary: 90 mg/dL (ref 70–99)

## 2012-02-14 LAB — BASIC METABOLIC PANEL
BUN: 9 mg/dL (ref 6–23)
CO2: 22 mEq/L (ref 19–32)
Calcium: 8.6 mg/dL (ref 8.4–10.5)
Chloride: 115 mEq/L — ABNORMAL HIGH (ref 96–112)
Creatinine, Ser: 0.77 mg/dL (ref 0.50–1.10)
GFR calc Af Amer: 83 mL/min — ABNORMAL LOW (ref >90)
GFR calc non Af Amer: 71 mL/min — ABNORMAL LOW (ref >90)
Glucose, Bld: 87 mg/dL (ref 70–99)
Potassium: 3.7 mEq/L (ref 3.5–5.1)
Sodium: 144 mEq/L (ref 135–145)

## 2012-02-14 LAB — CBC
HCT: 33.5 % — ABNORMAL LOW (ref 36.0–46.0)
Hemoglobin: 11.3 g/dL — ABNORMAL LOW (ref 12.0–15.0)
MCH: 30.9 pg (ref 26.0–34.0)
MCHC: 33.7 g/dL (ref 30.0–36.0)
MCV: 91.5 fL (ref 78.0–100.0)
Platelets: 199 10*3/uL (ref 150–400)
RBC: 3.66 MIL/uL — ABNORMAL LOW (ref 3.87–5.11)
RDW: 14.7 % (ref 11.5–15.5)
WBC: 5.9 10*3/uL (ref 4.0–10.5)

## 2012-02-14 LAB — POCT ACTIVATED CLOTTING TIME: Activated Clotting Time: 430 seconds

## 2012-02-14 MED ORDER — NITROGLYCERIN 0.4 MG SL SUBL: 0.4000 mg | SUBLINGUAL_TABLET | SUBLINGUAL | Status: DC | PRN

## 2012-02-14 MED FILL — Dextrose Inj 5%: INTRAVENOUS | Qty: 50 | Status: AC

## 2012-02-14 NOTE — Discharge Summary (Signed)
  Discharge summary dictated on 02/14/2012 dictation number is [id number]

## 2012-02-14 NOTE — Progress Notes (Signed)
CARDIAC REHAB PHASE I   PRE:  Rate/Rhythm: 60 Pacing  BP:  Supine: 136/49  Sitting:   Standing:    SaO2:   MODE:  Ambulation: 340 ft   POST:  Rate/Rhythem: 71 Pacing  BP:  Supine:   Sitting: 137/56  Standing:    SaO2:  0740-0830 Assisted X 1 and used walker to ambulate. Gait steady with walker. VS stable.No c/o of cp or SOB with walking. Completed discharge education with pt. She declines Outpt. CRP due to no transportation.  Beatrix Fetters

## 2012-02-14 NOTE — Discharge Summary (Signed)
NAMEJASA, DUNDON               ACCOUNT NO.:  [id number]  MEDICAL RECORD NO.:  [id number]  LOCATION:  2504                         FACILITY:  [facility]  PHYSICIAN:  Eduardo Osier. [name], M.D. DATE OF BIRTH:  Jun 08, 1918  DATE OF ADMISSION:  02/13/2012 DATE OF DISCHARGE:  02/14/2012                              DISCHARGE SUMMARY   ADMITTING DIAGNOSES: 1. Chest pain. 2. Exertional dyspnea, positive Lexiscan Myoview. 3. Status post recent catheterization. 4. Coronary artery disease. 5. History of anteroseptal wall myocardial infarction in the past. 6. Hypertension. 7. Hypercholesteremia. 8. Sick sinus syndrome, status post permanent pacemaker. 9. History of remote gastrointestinal bleeding in the past.  DISCHARGE DIAGNOSES: 1. Unstable angina, status post percutaneous transluminal coronary     angioplasty and stenting to distal left circumflex. 2. Coronary artery disease. 3. History of anteroseptal wall myocardial infarction in September of     2011, subsequently had percutaneous transluminal coronary     angioplasty and stenting to the mid left anterior descending     artery. 4. Hypertension. 5. Hypercholesteremia. 6. Sick sinus syndrome, status post permanent pacemaker. 7. History of remote gastrointestinal bleeding.  DISCHARGE HOME MEDICATIONS: 1. Enteric-coated aspirin 81 mg 1 tablet daily. 2. Clopidogrel 75 mg 1 tablet daily. 3. Carvedilol 6.25 mg 1 tablet twice daily. 4. Ramipril 10 mg 1 capsule twice daily. 5. Crestor 5 mg 1 tablet daily. 6. Nitro-Dur patch 0.2 mg per hour daily. 7. Nitrostat 0.4 mg sublingual use as directed. 8. The patient is on multiple eye drops, i.e. Lumigan 1 drop in the     right eye at bedtime as before, Azopt 1 drop in both eyes 3 times     daily as before, Trusopt 1 drop 3 times daily as before. 9. Pepcid 20 mg 1 tablet twice daily. 10.Folic acid 1 mg daily. 11.Nevanac 3 drops in right eye 3 times daily as before. 12.Prednisolone 2 drops into  the right eye 4 times daily as before.  DIET:  Low salt, low cholesterol.  ACTIVITY:  Increase activity slowly as tolerated.  Postcardiac cath and PTCA stenting instructions have been given.  Follow up with me in 1 week.  The patient will be scheduled for phase 2 cardiac rehab as outpatient.  CONDITION AT DISCHARGE:  Stable.  BRIEF HISTORY AND COURSE:  Ms. Neuhaus is 78 year old female with past medical history significant for coronary artery disease, history of anteroseptal wall MI in September of 2011, subsequently had PTCA stenting to mid-LAD, sick sinus syndrome, status post permanent pacemaker, hypertension, hypercholesteremia, degenerative joint disease, history of diverticular bleed in the past, history of glaucoma of the eyes, complains of vague right-sided chest pain, shoulder pain, arm pain off and on associated with exertional dyspnea.  The patient underwent Lexiscan Myoview on January 19, 2012, which showed mid-to-distal anterolateral wall and proximal inferolateral wall ischemia with EF of 68%.  The patient subsequently underwent left cardiac cath with selective left and right coronary angiography few weeks ago, which showed patent LAD stent and had critical distal left circumflex stenosis.  In view of prior history of GI bleeding and as the patient was off aspirin and Plavix, the patient was restarted on aspirin and Plavix for  2 weeks prior to the PCI.  The patient has been tolerating aspirin and Plavix without any GI symptoms or any evidence of bleeding. The patient subsequently was a.m. admit and underwent PTCA stenting to distal left circumflex as per procedure report.  The patient tolerated the procedure well.  There were no complications.  Postprocedure, the patient did not had any episodes of chest pain during the hospital stay. Her groin is stable with no evidence of hematoma or bruit.  The patient has ambulated in hallway without any problems.  The patient will  be discharged home on above medications and will be followed up in my office in 1 week.     Eduardo Osier. [name], M.D.     [facility]/MEDQ  D:  02/14/2012  T:  02/14/2012  Job:  [id number]

## 2012-02-14 NOTE — Discharge Instructions (Signed)
Coronary Angiography with Stent This is a procedure to widen or open a narrow blood vessel of the heart (coronary artery). When a coronary artery becomes partially blocked it decreases blood flow to that area. This may lead to chest pain or a heart attack (myocardial infarction). Arteries may become blocked by cholesterol buildup (plaque) in the lining or wall. A stent is a small piece of metal that looks like a mesh or a spring. Stent placement may be done right after an angiogram that finds a blocked artery or as a treatment for a heart attack. RISKS AND COMPLICATIONS  Damage to the heart.   A blockage may return.   Bleeding at the site.   Blood clot to another part of the body.  PROCEDURE  You may be given a medication to help you relax before and during the procedure through an IV in your hand or arm.   A local anesthetic to make the area numb may be used before inserting the catheter (a long, hollow tube about the size of a piece of cooked spaghetti).   You will be prepared for the procedure by washing and shaving the area where the catheter will be inserted. This is usually done in the groin.   A specially trained doctor will insert the catheter with a guide wire into an artery. This is guided under a special type of X-ray (fluoroscopy) to the opening of the blocked artery.   Special dye is then injected and X-rays are taken.   A tiny wire is guided to the blocked spot and a balloon is inflated to make the artery wider. The stent is expanded and crushes the plaque into the wall of the vessel. The stent holds the area open like a scaffolding and improves the blood flow.   Sometimes the artery may be made wider using a laser or other tools to remove plaque.   When the blood flow is better, the catheter is removed. The lining of the artery will grow over the stent which stays where it was placed.  AFTER THE PROCEDURE  You will stay in bed for several hours.   The access site will  be watched and you will be checked frequently.   Blood tests, X-rays and an EKG may be done.   You may stay in the hospital overnight for observation.  SEEK IMMEDIATE MEDICAL CARE IF:   You develop chest pain, shortness of breath, feel faint, or pass out.   There is bleeding, swelling, or drainage from the catheter insertion site.   You develop pain, discoloration, coldness, or severe bruising in the leg or arm that held the catheter.   You see blood in your urine or stool. This may be bright red blood in urine or stools, or also appear as black, tarry stools.   You have a fever.  Document Released: 04/22/2003 Document Revised: 10/05/2011 Document Reviewed: 12/13/2007 Exit[employer] Patient Information 2012 [employer]. 

## 2012-03-09 ENCOUNTER — Encounter (HOSPITAL_COMMUNITY): Payer: Self-pay | Admitting: *Deleted

## 2012-03-09 ENCOUNTER — Emergency Department (HOSPITAL_COMMUNITY)
Admission: EM | Admit: 2012-03-09 | Discharge: 2012-03-09 | Disposition: A | Discharge status: Home / Self Care | Payer: Medicare Other | Attending: Emergency Medicine | Admitting: Emergency Medicine

## 2012-03-09 DIAGNOSIS — N949 Unspecified condition associated with female genital organs and menstrual cycle: Secondary | ICD-10-CM | POA: Insufficient documentation

## 2012-03-09 DIAGNOSIS — N390 Urinary tract infection, site not specified: Secondary | ICD-10-CM

## 2012-03-09 DIAGNOSIS — R300 Dysuria: Secondary | ICD-10-CM | POA: Insufficient documentation

## 2012-03-09 DIAGNOSIS — M549 Dorsalgia, unspecified: Secondary | ICD-10-CM | POA: Insufficient documentation

## 2012-03-09 LAB — URINALYSIS, ROUTINE W REFLEX MICROSCOPIC
Bilirubin Urine: NEGATIVE
Glucose, UA: NEGATIVE mg/dL
Hgb urine dipstick: NEGATIVE
Ketones, ur: NEGATIVE mg/dL
Nitrite: NEGATIVE
Protein, ur: NEGATIVE mg/dL
Specific Gravity, Urine: 1.009 (ref 1.005–1.030)
Urobilinogen, UA: 0.2 mg/dL (ref 0.0–1.0)
pH: 6.5 (ref 5.0–8.0)

## 2012-03-09 LAB — WET PREP, GENITAL
Clue Cells Wet Prep HPF POC: NONE SEEN
Trich, Wet Prep: NONE SEEN
Yeast Wet Prep HPF POC: NONE SEEN

## 2012-03-09 LAB — URINE MICROSCOPIC-ADD ON

## 2012-03-09 MED ORDER — CIPROFLOXACIN HCL 500 MG PO TABS: 500.0000 mg | ORAL_TABLET | Freq: Two times a day (BID) | ORAL | Status: AC

## 2012-03-09 MED ORDER — ACETAMINOPHEN 325 MG PO TABS
975.0000 mg | ORAL_TABLET | Freq: Once | ORAL | Status: AC
  Administered 2012-03-09: 975 mg via ORAL

## 2012-03-09 MED ORDER — ACETAMINOPHEN 325 MG PO TABS
ORAL_TABLET | ORAL | Status: AC
  Filled 2012-03-09: qty 3

## 2012-03-09 NOTE — Discharge Instructions (Signed)
Urinary Tract Infection, Child  A urinary tract infection (UTI) is an infection of the kidneys or bladder. This infection is usually caused by bacteria.  CAUSES    Ignoring the need to urinate or holding urine for long periods of time.   Not emptying the bladder completely during urination.   In girls, wiping from back to front after urination or bowel movements.   Using bubble bath, shampoos, or soaps in your child's bath water.   Constipation.   Abnormalities of the kidneys or bladder.  SYMPTOMS    Frequent urination.   Pain or burning sensation with urination.   Urine that smells unusual or is cloudy.   Lower abdominal or back pain.   Bed wetting.   Difficulty urinating.   Blood in the urine.   Fever.   Irritability.  DIAGNOSIS   A UTI is diagnosed with a urine culture. A urine culture detects bacteria and yeast in urine. A sample of urine will need to be collected for a urine culture.  TREATMENT   A bladder infection (cystitis) or kidney infection (pyelonephritis) will usually respond to antibiotics. These are medications that kill germs. Your child should take all the medicine given until it is gone. Your child may feel better in a few days, but give ALL MEDICINE. Otherwise, the infection may not respond and become more difficult to treat. Response can generally be expected in 7 to 10 days.  HOME CARE INSTRUCTIONS    Give your child lots of fluid to drink.   Avoid caffeine, tea, and carbonated beverages. They tend to irritate the bladder.   Do not use bubble bath, shampoos, or soaps in your child's bath water.   Only give your child over-the-counter or prescription medicines for pain, discomfort, or fever as directed by your child's caregiver.   Do not give aspirin to children. It may cause Reye's syndrome.   It is important that you keep all follow-up appointments. Be sure to tell your caregiver if your child's symptoms continue or return. For repeated infections, your caregiver may need  to evaluate your child's kidneys or bladder.  To prevent further infections:   Encourage your child to empty his or her bladder often and not to hold urine for long periods of time.   After a bowel movement, girls should cleanse from front to back. Use each tissue only once.  SEEK MEDICAL CARE IF:    Your child develops back pain.   Your child has an oral temperature above 102 F (38.9 C).   Your baby is older than 3 months with a rectal temperature of 100.5 F (38.1 C) or higher for more than 1 day.   Your child develops nausea or vomiting.   Your child's symptoms are no better after 3 days of antibiotics.  SEEK IMMEDIATE MEDICAL CARE IF:   Your child has an oral temperature above 102 F (38.9 C).   Your baby is older than 3 months with a rectal temperature of 102 F (38.9 C) or higher.   Your baby is 3 months old or younger with a rectal temperature of 100.4 F (38 C) or higher.  Document Released: 07/26/2005 Document Revised: 10/05/2011 Document Reviewed: 08/06/2009  [employer] Patient Information 2012 [employer].

## 2012-03-09 NOTE — ED Notes (Signed)
Pt reports vaginal pain, dysuria, and lower back pain x weeks. States it is just getting worse.

## 2012-03-09 NOTE — ED Notes (Signed)
Pt c/o lower abdominal pain, itching, and back pain ongoing for 1 year.  Pt had appt with OBGYN today, came here after because she couldn't stand the pain.  Has not taken any pain medication today.

## 2012-03-09 NOTE — ED Notes (Signed)
Rx x 1, pt voiced understanding to f/u with referred doctor.

## 2012-03-09 NOTE — ED Provider Notes (Signed)
History     CSN: [id number]  Arrival date & time 03/09/12  0140   First MD Initiated Contact with Patient 03/09/12 [phone]      Chief Complaint  Patient presents with  . Dysuria  . Back Pain    (Consider location/radiation/quality/duration/timing/severity/associated sxs/prior treatment) Patient is a 78 y.o. female presenting with dysuria. The history is provided by the patient. No language interpreter was used.  Dysuria  This is a chronic problem. The current episode started more than 1 week ago (x 1 year with > 1 year of pelvic pain). The problem occurs intermittently. The problem has not changed since onset.The quality of the pain is described as burning. The pain is moderate. There has been no fever. Pertinent negatives include no nausea and no vomiting. She has tried antibiotics for the symptoms. Her past medical history is significant for recurrent UTIs.  Has had vaginal pain x > 1 year and has seen Dr. Rana Snare of GYN multiple times, the source cannot be found so the patient decided to come to the Ed for the pain.   Past Medical History  Diagnosis Date  . Hypertension   . Coronary artery disease   . Myocardial infarction     x 3  . Angina     on nitro-dur  . Shortness of breath     with exertion  . GERD (gastroesophageal reflux disease)   . Hyperlipidemia   . Glaucoma   . History of eye surgery     bilateral   . Anemia   . Pacemaker     Past Surgical History  Procedure Date  . Cardiac catherization 06/2010  . Cardiac catheterization   . Pacemaker insertion 06/2010  . Svd     x 2  . Appendectomy   . Thyroidectomy   . Hysteroscopy w/d&c 09/07/2011    Procedure: DILATATION AND CURETTAGE (D&C) /HYSTEROSCOPY;  Surgeon: Turner Daniels, MD;  Location: [facility];  Service: Gynecology;  Laterality: N/A;  uterus  . Coronary angioplasty with stent placement 02/13/2012    History reviewed. No pertinent family history.  History  Substance Use Topics  . Smoking status: Former Smoker  -- 0.5 packs/day    Types: Cigarettes    Quit date: 09/05/1991  . Smokeless tobacco: Never Used  . Alcohol Use: No     none since 1992    OB History    Grav Para Term Preterm Abortions TAB SAB Ect Mult Living                  Review of Systems  Gastrointestinal: Negative for nausea, vomiting and diarrhea.  Genitourinary: Positive for vaginal pain. Negative for vaginal bleeding and vaginal discharge.  All other systems reviewed and are negative.    Allergies  Review of patient's allergies indicates no known allergies.  Home Medications   Current Outpatient Rx  Name Route Sig Dispense Refill  . ASPIRIN 81 MG PO TBEC Oral Take 81 mg by mouth daily.    Marland Kitchen BIMATOPROST 0.03 % OP SOLN Right Eye Place 1 drop into the right eye at bedtime.     Marland Kitchen BRINZOLAMIDE 1 % OP SUSP Both Eyes Place 1 drop into both eyes 3 (three) times daily.    Marland Kitchen CARVEDILOL 6.25 MG PO TABS Oral Take 6.25 mg by mouth 2 (two) times daily with a meal.     . CLOPIDOGREL BISULFATE 75 MG PO TABS Oral Take 75 mg by mouth daily.    . DORZOLAMIDE HCL 2 %  OP SOLN Right Eye Place 1 drop into the right eye 3 (three) times daily.     Marland Kitchen FAMOTIDINE 20 MG PO TABS Oral Take 20 mg by mouth 2 (two) times daily.     Marland Kitchen FOLIC ACID 1 MG PO TABS Oral Take 1 mg by mouth daily.     Marland Kitchen NEPAFENAC 0.1 % OP SUSP Right Eye Place 3 drops into the right eye 3 (three) times daily.     Marland Kitchen NITROGLYCERIN 0.2 MG/HR TD PT24 Transdermal Place 1 patch onto the skin daily.     Marland Kitchen NITROGLYCERIN 0.4 MG SL SUBL Sublingual Place 1 tablet (0.4 mg total) under the tongue every 5 (five) minutes as needed for chest pain. 25 tablet 12  . PREDNISOLONE ACETATE 1 % OP SUSP Right Eye Place 2 drops into the right eye 4 (four) times daily.     Marland Kitchen RAMIPRIL 10 MG PO CAPS Oral Take 10 mg by mouth 2 (two) times daily.      Marland Kitchen ROSUVASTATIN CALCIUM 5 MG PO TABS Oral Take 5 mg by mouth daily.       BP 145/72  Pulse 72  Temp(Src) 97.9 F (36.6 C) (Oral)  Resp 18  SpO2  96%  Physical Exam  Constitutional: She is oriented to person, place, and time. She appears well-developed and well-nourished.  HENT:  Head: Normocephalic and atraumatic.  Eyes: Conjunctivae are normal. Pupils are equal, round, and reactive to light.  Neck: Normal range of motion. Neck supple.  Cardiovascular: Normal rate and regular rhythm.   Pulmonary/Chest: Effort normal and breath sounds normal.  Abdominal: Soft. Bowel sounds are normal. There is no tenderness. There is no rebound and no guarding.  Genitourinary: Vagina normal. No vaginal discharge found.  Musculoskeletal: Normal range of motion.  Neurological: She is alert and oriented to person, place, and time.  Skin: Skin is warm and dry.  Psychiatric: She has a normal mood and affect.    ED Course  Procedures (including critical care time)  Labs Reviewed  URINALYSIS, ROUTINE W REFLEX MICROSCOPIC - Abnormal; Notable for the following:    Leukocytes, UA TRACE (*)    All other components within normal limits  WET PREP, GENITAL - Abnormal; Notable for the following:    WBC, Wet Prep HPF POC MODERATE (*)    All other components within normal limits  URINE MICROSCOPIC-ADD ON  URINE CULTURE  GC/CHLAMYDIA PROBE AMP, GENITAL   No results found.   No diagnosis found.    MDM  Patient with pelvic pain x > 1 year seen by Dr. Rana Snare earlier today.  Will need further work up by Dr. Rana Snare including pap smear.  Patient and daughter informed of need for close follow up.  Will treat for UTI,        Jodel Mayhall K Oscar Hank-Rasch, MD 03/09/12 [phone]

## 2012-03-10 LAB — URINE CULTURE
Colony Count: 80000
Culture  Setup Time: 201305111125

## 2012-03-11 LAB — GC/CHLAMYDIA PROBE AMP, GENITAL
Chlamydia, DNA Probe: NEGATIVE
GC Probe Amp, Genital: NEGATIVE

## 2012-05-23 ENCOUNTER — Emergency Department (HOSPITAL_COMMUNITY)
Admission: EM | Admit: 2012-05-23 | Discharge: 2012-05-24 | Disposition: A | Discharge status: Home / Self Care | Payer: Medicare Other | Attending: Emergency Medicine | Admitting: Emergency Medicine

## 2012-05-23 ENCOUNTER — Encounter (HOSPITAL_COMMUNITY): Payer: Self-pay | Admitting: Emergency Medicine

## 2012-05-23 DIAGNOSIS — Z950 Presence of cardiac pacemaker: Secondary | ICD-10-CM | POA: Insufficient documentation

## 2012-05-23 DIAGNOSIS — E785 Hyperlipidemia, unspecified: Secondary | ICD-10-CM | POA: Insufficient documentation

## 2012-05-23 DIAGNOSIS — Z9861 Coronary angioplasty status: Secondary | ICD-10-CM | POA: Insufficient documentation

## 2012-05-23 DIAGNOSIS — R109 Unspecified abdominal pain: Secondary | ICD-10-CM

## 2012-05-23 DIAGNOSIS — H409 Unspecified glaucoma: Secondary | ICD-10-CM | POA: Insufficient documentation

## 2012-05-23 DIAGNOSIS — Z7982 Long term (current) use of aspirin: Secondary | ICD-10-CM | POA: Insufficient documentation

## 2012-05-23 DIAGNOSIS — I252 Old myocardial infarction: Secondary | ICD-10-CM | POA: Insufficient documentation

## 2012-05-23 DIAGNOSIS — I2510 Atherosclerotic heart disease of native coronary artery without angina pectoris: Secondary | ICD-10-CM | POA: Insufficient documentation

## 2012-05-23 DIAGNOSIS — F17200 Nicotine dependence, unspecified, uncomplicated: Secondary | ICD-10-CM | POA: Insufficient documentation

## 2012-05-23 DIAGNOSIS — K219 Gastro-esophageal reflux disease without esophagitis: Secondary | ICD-10-CM | POA: Insufficient documentation

## 2012-05-23 DIAGNOSIS — I10 Essential (primary) hypertension: Secondary | ICD-10-CM | POA: Insufficient documentation

## 2012-05-23 LAB — COMPREHENSIVE METABOLIC PANEL
ALT: 11 U/L (ref 0–35)
AST: 22 U/L (ref 0–37)
Albumin: 3.8 g/dL (ref 3.5–5.2)
Alkaline Phosphatase: 49 U/L (ref 39–117)
BUN: 11 mg/dL (ref 6–23)
CO2: 23 mEq/L (ref 19–32)
Calcium: 9.2 mg/dL (ref 8.4–10.5)
Chloride: 109 mEq/L (ref 96–112)
Creatinine, Ser: 0.82 mg/dL (ref 0.50–1.10)
GFR calc Af Amer: 70 mL/min — ABNORMAL LOW (ref >90)
GFR calc non Af Amer: 61 mL/min — ABNORMAL LOW (ref >90)
Glucose, Bld: 98 mg/dL (ref 70–99)
Potassium: 3.8 mEq/L (ref 3.5–5.1)
Sodium: 142 mEq/L (ref 135–145)
Total Bilirubin: 0.2 mg/dL — ABNORMAL LOW (ref 0.3–1.2)
Total Protein: 6.5 g/dL (ref 6.0–8.3)

## 2012-05-23 LAB — URINALYSIS, ROUTINE W REFLEX MICROSCOPIC
Bilirubin Urine: NEGATIVE
Glucose, UA: NEGATIVE mg/dL
Hgb urine dipstick: NEGATIVE
Ketones, ur: NEGATIVE mg/dL
Leukocytes, UA: NEGATIVE
Nitrite: NEGATIVE
Protein, ur: NEGATIVE mg/dL
Specific Gravity, Urine: 1.007 (ref 1.005–1.030)
Urobilinogen, UA: 0.2 mg/dL (ref 0.0–1.0)
pH: 7 (ref 5.0–8.0)

## 2012-05-23 LAB — CBC WITH DIFFERENTIAL/PLATELET
Basophils Absolute: 0 10*3/uL (ref 0.0–0.1)
Basophils Relative: 1 % (ref 0–1)
Eosinophils Absolute: 0.2 10*3/uL (ref 0.0–0.7)
Eosinophils Relative: 5 % (ref 0–5)
HCT: 35.1 % — ABNORMAL LOW (ref 36.0–46.0)
Hemoglobin: 11.9 g/dL — ABNORMAL LOW (ref 12.0–15.0)
Lymphocytes Relative: 31 % (ref 12–46)
Lymphs Abs: 1.6 10*3/uL (ref 0.7–4.0)
MCH: 31.4 pg (ref 26.0–34.0)
MCHC: 33.9 g/dL (ref 30.0–36.0)
MCV: 92.6 fL (ref 78.0–100.0)
Monocytes Absolute: 0.6 10*3/uL (ref 0.1–1.0)
Monocytes Relative: 13 % — ABNORMAL HIGH (ref 3–12)
Neutro Abs: 2.6 10*3/uL (ref 1.7–7.7)
Neutrophils Relative %: 51 % (ref 43–77)
Platelets: 202 10*3/uL (ref 150–400)
RBC: 3.79 MIL/uL — ABNORMAL LOW (ref 3.87–5.11)
RDW: 14.2 % (ref 11.5–15.5)
WBC: 5.1 10*3/uL (ref 4.0–10.5)

## 2012-05-23 LAB — LIPASE, BLOOD: Lipase: 24 U/L (ref 11–59)

## 2012-05-23 LAB — LACTIC ACID, PLASMA: Lactic Acid, Venous: 1.2 mmol/L (ref 0.5–2.2)

## 2012-05-23 MED ORDER — SODIUM CHLORIDE 0.9 % IV BOLUS (SEPSIS)
500.0000 mL | Freq: Once | INTRAVENOUS | Status: AC
  Administered 2012-05-23: 500 mL via INTRAVENOUS

## 2012-05-23 MED ORDER — ONDANSETRON HCL 4 MG/2ML IJ SOLN
4.0000 mg | Freq: Once | INTRAMUSCULAR | Status: AC
  Administered 2012-05-23: 4 mg via INTRAVENOUS
  Filled 2012-05-23: qty 2

## 2012-05-23 MED ORDER — FENTANYL CITRATE 0.05 MG/ML IJ SOLN
50.0000 ug | Freq: Once | INTRAMUSCULAR | Status: AC
  Administered 2012-05-23: 50 ug via INTRAVENOUS
  Filled 2012-05-23: qty 2

## 2012-05-23 NOTE — ED Notes (Addendum)
Per EMS, she has been having lower abdominal pain. Tender to palpation. Pain started 1 week ago and was intermittent. It became worse on Saturday.  No rigidity or mass. Nausea but no vomiting. No hematuria. No loose stool. Vitals: 186/96, 62, 16. Hx of HTN. No IV

## 2012-05-23 NOTE — ED Provider Notes (Signed)
History     CSN: [id number]  Arrival date & time 05/23/12  2115   First MD Initiated Contact with Patient 05/23/12 2130      Chief Complaint  Patient presents with  . Abdominal Pain    (Consider location/radiation/quality/duration/timing/severity/associated sxs/prior treatment) HPI Comments: Pt reports generalized pain across her mid and lower abd for about 1 week, waxes and wanes, persistent. Can eat, decreased appetite, eating doesn't make worse, feels pain as a pulling sensation, she thinks she feels something in her abdomen on left side. Moved bowels well.  Doesn't think she has had fevers or chills.  Denies black or blood stools.  No CP, SOB, sweats.  Denies radiation of pain to back.    Patient is a 78 y.o. female presenting with abdominal pain. The history is provided by the patient.  Abdominal Pain The primary symptoms of the illness include abdominal pain. The primary symptoms of the illness do not include fever, nausea, vomiting, diarrhea or dysuria.  Symptoms associated with the illness do not include chills or back pain.    Past Medical History  Diagnosis Date  . Hypertension   . Coronary artery disease   . Myocardial infarction     x 3  . Angina     on nitro-dur  . Shortness of breath     with exertion  . GERD (gastroesophageal reflux disease)   . Hyperlipidemia   . Glaucoma   . History of eye surgery     bilateral   . Anemia   . Pacemaker     Past Surgical History  Procedure Date  . Cardiac catherization 06/2010  . Cardiac catheterization   . Pacemaker insertion 06/2010  . Svd     x 2  . Appendectomy   . Thyroidectomy   . Hysteroscopy w/d&c 09/07/2011    Procedure: DILATATION AND CURETTAGE (D&C) /HYSTEROSCOPY;  Surgeon: Turner Daniels, MD;  Location: [facility];  Service: Gynecology;  Laterality: N/A;  uterus  . Coronary angioplasty with stent placement 02/13/2012    History reviewed. No pertinent family history.  History  Substance Use Topics  .  Smoking status: Former Smoker -- 0.5 packs/day    Types: Cigarettes    Quit date: 09/05/1991  . Smokeless tobacco: Never Used  . Alcohol Use: No     none since 1992    OB History    Grav Para Term Preterm Abortions TAB SAB Ect Mult Living                  Review of Systems  Constitutional: Positive for appetite change. Negative for fever and chills.  Gastrointestinal: Positive for abdominal pain. Negative for nausea, vomiting, diarrhea and blood in stool.  Genitourinary: Negative for dysuria and flank pain.  Musculoskeletal: Negative for back pain.  All other systems reviewed and are negative.    Allergies  Review of patient's allergies indicates no known allergies.  Home Medications   Current Outpatient Rx  Name Route Sig Dispense Refill  . ASPIRIN 81 MG PO TBEC Oral Take 81 mg by mouth every morning.     Marland Kitchen BIMATOPROST 0.03 % OP SOLN Left Eye Place 1 drop into the left eye at bedtime.     . CLOPIDOGREL BISULFATE 75 MG PO TABS Oral Take 75 mg by mouth daily.    . DORZOLAMIDE HCL 2 % OP SOLN Right Eye Place 1 drop into the right eye 3 (three) times daily.     Marland Kitchen FAMOTIDINE 20 MG PO  TABS Oral Take 20 mg by mouth 2 (two) times daily.     Marland Kitchen FOLIC ACID 1 MG PO TABS Oral Take 1 mg by mouth daily.     Marland Kitchen NEPAFENAC 0.1 % OP SUSP Right Eye Place 1 drop into the right eye at bedtime.     Marland Kitchen NITROGLYCERIN 0.2 MG/HR TD PT24 Transdermal Place 1 patch onto the skin every morning. Apply to chest wall & remove at night    . NITROGLYCERIN 0.4 MG SL SUBL Sublingual Place 0.4 mg under the tongue every 5 (five) minutes as needed. For chest pain    . PREDNISOLONE ACETATE 1 % OP SUSP Left Eye Place 1 drop into the left eye 3 (three) times daily.     Marland Kitchen RAMIPRIL 10 MG PO CAPS Oral Take 10 mg by mouth 2 (two) times daily.        BP 156/64  Pulse 60  Temp 97.9 F (36.6 C) (Oral)  Resp 18  SpO2 98%  Physical Exam  Nursing note and vitals reviewed. Constitutional: She is oriented to person,  place, and time. She appears well-developed and well-nourished.  HENT:  Head: Normocephalic and atraumatic.  Eyes: EOM are normal. Pupils are equal, round, and reactive to light. No scleral icterus.       Arcus senilis  Cardiovascular: Normal rate.   No murmur heard. Pulmonary/Chest: Effort normal. No respiratory distress. She has no wheezes.  Abdominal: Soft. She exhibits no distension. There is tenderness. There is no rebound and no guarding.  Musculoskeletal: She exhibits no edema and no tenderness.  Neurological: She is alert and oriented to person, place, and time.  Skin: Skin is warm and dry.    ED Course  Procedures (including critical care time)  Labs Reviewed  CBC WITH DIFFERENTIAL - Abnormal; Notable for the following:    RBC 3.79 (*)     Hemoglobin 11.9 (*)     HCT 35.1 (*)     Monocytes Relative 13 (*)     All other components within normal limits  COMPREHENSIVE METABOLIC PANEL - Abnormal; Notable for the following:    Total Bilirubin 0.2 (*)     GFR calc non Af Amer 61 (*)     GFR calc Af Amer 70 (*)     All other components within normal limits  LIPASE, BLOOD  LACTIC ACID, PLASMA  URINALYSIS, ROUTINE W REFLEX MICROSCOPIC   No results found.  RA sat is 98% which I interpret to be normal.   1. Abdominal pain     11:42 PM Labs are ok, CT of abd/pelvis with contrast ordered.  Pt is signed out to Dr. Rubin Payor.  WBC is normal, Hgb is minimally low but at baseline, CMP is ok, no obv UTI.    MDM  Abd is mildly tender middle, lower.  Will give IV analgesics and obtain CT Scan.  Check labs, UA.  Consider diverticulitis.          Gavin Pound. Oletta Lamas, MD 05/23/12 [phone]

## 2012-05-23 NOTE — ED Notes (Signed)
Claris Che, daughter #: [phone] Please call with dispo

## 2012-05-24 ENCOUNTER — Emergency Department (HOSPITAL_COMMUNITY): Payer: Medicare Other

## 2012-05-24 ENCOUNTER — Encounter (HOSPITAL_COMMUNITY): Payer: Self-pay | Admitting: Radiology

## 2012-05-24 MED ORDER — IOHEXOL 300 MG/ML  SOLN
100.0000 mL | Freq: Once | INTRAMUSCULAR | Status: AC | PRN
  Administered 2012-05-24: 100 mL via INTRAVENOUS

## 2012-05-24 MED ORDER — ONDANSETRON HCL 4 MG/2ML IJ SOLN
INTRAMUSCULAR | Status: AC
  Administered 2012-05-24: 4 mg via INTRAVENOUS
  Filled 2012-05-24: qty 2

## 2012-05-24 MED ORDER — ONDANSETRON HCL 4 MG/2ML IJ SOLN
4.0000 mg | Freq: Once | INTRAMUSCULAR | Status: AC
  Administered 2012-05-24: 4 mg via INTRAVENOUS

## 2012-05-24 NOTE — ED Notes (Signed)
Ct made aware that pt is done with contrast.

## 2012-05-24 NOTE — ED Provider Notes (Signed)
  Physical Exam  BP 162/62  Pulse 59  Temp 97.9 F (36.6 C) (Oral)  Resp 16  SpO2 98%  Physical Exam  ED Course  Procedures  MDM Patient with abdominal pain. Lab work and CT reassuring. Patient feels better and will be discharged home.      Juliet Rude. Rubin Payor, MD 05/24/12 [phone]

## 2012-05-24 NOTE — ED Notes (Signed)
Pt stated that she has red bumps on arm and wants the EDP to look at it. EDP checked on bumps and okayed for pt to be d/c.

## 2012-12-05 ENCOUNTER — Encounter (HOSPITAL_BASED_OUTPATIENT_CLINIC_OR_DEPARTMENT_OTHER): Payer: Self-pay | Admitting: *Deleted

## 2012-12-05 ENCOUNTER — Encounter (HOSPITAL_BASED_OUTPATIENT_CLINIC_OR_DEPARTMENT_OTHER)
Admission: RE | Disposition: A | Discharge status: Hospice | Payer: Self-pay | Source: Ambulatory Visit | Attending: Cardiology

## 2012-12-05 ENCOUNTER — Inpatient Hospital Stay (HOSPITAL_BASED_OUTPATIENT_CLINIC_OR_DEPARTMENT_OTHER)
Admission: RE | Admit: 2012-12-05 | Discharge: 2012-12-05 | Disposition: A | Discharge status: Hospice | Payer: Medicare PPO | Source: Ambulatory Visit | Attending: Cardiology | Admitting: Cardiology

## 2012-12-05 DIAGNOSIS — I495 Sick sinus syndrome: Secondary | ICD-10-CM | POA: Insufficient documentation

## 2012-12-05 DIAGNOSIS — Z9861 Coronary angioplasty status: Secondary | ICD-10-CM | POA: Insufficient documentation

## 2012-12-05 DIAGNOSIS — I252 Old myocardial infarction: Secondary | ICD-10-CM | POA: Insufficient documentation

## 2012-12-05 DIAGNOSIS — I10 Essential (primary) hypertension: Secondary | ICD-10-CM | POA: Insufficient documentation

## 2012-12-05 DIAGNOSIS — E7800 Pure hypercholesterolemia, unspecified: Secondary | ICD-10-CM | POA: Insufficient documentation

## 2012-12-05 DIAGNOSIS — I2510 Atherosclerotic heart disease of native coronary artery without angina pectoris: Secondary | ICD-10-CM | POA: Insufficient documentation

## 2012-12-05 SURGERY — JV LEFT HEART CATHETERIZATION WITH CORONARY ANGIOGRAM

## 2012-12-05 MED ORDER — ONDANSETRON HCL 4 MG/2ML IJ SOLN: 4.0000 mg | Freq: Four times a day (QID) | INTRAMUSCULAR | Status: DC | PRN

## 2012-12-05 MED ORDER — DIAZEPAM 5 MG PO TABS
5.0000 mg | ORAL_TABLET | ORAL | Status: AC
  Administered 2012-12-05: 5 mg via ORAL

## 2012-12-05 MED ORDER — SODIUM CHLORIDE 0.9 % IV SOLN: 250.0000 mL | INTRAVENOUS | Status: DC | PRN

## 2012-12-05 MED ORDER — SODIUM CHLORIDE 0.9 % IJ SOLN: 3.0000 mL | Freq: Two times a day (BID) | INTRAMUSCULAR | Status: DC

## 2012-12-05 MED ORDER — SODIUM CHLORIDE 0.9 % IV SOLN
INTRAVENOUS | Status: DC
  Administered 2012-12-05: 07:00:00 via INTRAVENOUS

## 2012-12-05 MED ORDER — ACETAMINOPHEN 325 MG PO TABS: 650.0000 mg | ORAL_TABLET | ORAL | Status: DC | PRN

## 2012-12-05 MED ORDER — SODIUM CHLORIDE 0.9 % IV SOLN: INTRAVENOUS | Status: DC

## 2012-12-05 MED ORDER — SODIUM CHLORIDE 0.9 % IJ SOLN: 3.0000 mL | INTRAMUSCULAR | Status: DC | PRN

## 2012-12-05 MED ORDER — ASPIRIN 81 MG PO CHEW
324.0000 mg | CHEWABLE_TABLET | ORAL | Status: AC
  Administered 2012-12-05: 324 mg via ORAL

## 2012-12-05 NOTE — H&P (Signed)
  Handwritten H&P in the chart needs to be scanned. 

## 2012-12-05 NOTE — Progress Notes (Signed)
Voided 300cc of yellow urine.  Tolerated fluids well.

## 2012-12-05 NOTE — CV Procedure (Signed)
Left cardiac cath report dictated on 12/05/2038 dictation number is [phone]

## 2012-12-05 NOTE — Progress Notes (Signed)
Pt alert and tolerating food and fluids without any difficulty.  Discharge instruction given per MD order.  Pt/CG able to verbalize understanding.  Pt denies any discomfort at this time.

## 2012-12-06 NOTE — Cardiovascular Report (Signed)
[name], [name]               ACCOUNT NO.:  [id number]  MEDICAL RECORD NO.:  [id number]  LOCATION:                                 FACILITY:  PHYSICIAN:  Cace Osorto N. [name], M.D.      DATE OF BIRTH:  DATE OF PROCEDURE: DATE OF DISCHARGE:                           CARDIAC CATHETERIZATION   PROCEDURE:  Left cardiac cath with selective left and right coronary angiography, left ventriculography via right groin using Judkins technique.  INDICATION FOR THE PROCEDURE:  The patient is a 79 year old black female with past medical history significant for coronary artery disease, history of anteroseptal wall myocardial infarction, status post PTCA stenting to LAD in September, 2011 and subsequently had PTCA stenting to left circumflex in April 2013, hypertension, hypercholesteremia, sick sinus syndrome, status post permanent pacemaker, history of remote GI bleed in the past, complains of retrosternal chest pain with minimal exertion associated with feeling weak, tired, and fatigued off and on, relieved with rest and sublingual nitro.  The patient denies any nausea, vomiting, diaphoresis.  Denies rest or nocturnal angina.  EKG done in the office showed normal sinus rhythm with first-degree AV block, septal Q-waves, and T-wave inversion in anterolateral and inferior leads.  Due to typical anginal chest pain, EKG changes, multiple risk factors, discussed with the patient regarding left cath, possible PTCA stenting, its risks and benefits, i.e., death, MI, stroke, need for emergency CABG, risk of restenosis, local vascular complications, etc. and consents for PCI.  DESCRIPTION OF PROCEDURE:  After obtaining the informed consent, the patient was brought to the cath lab and was placed on fluoroscopy table. Right groin was prepped and draped in usual fashion.  Xylocaine 1% was used for local anesthesia in the right groin.  With the help of thin wall needle, a 4-French arterial sheath was  placed.  The sheath was aspirated and flushed.  Next, 4-French left Judkins catheter was advanced over the wire under fluoroscopic guidance up to the ascending aorta.  Wire was pulled out.  The catheter was aspirated and connected to the Manifold.  Catheter was further advanced and engaged into left coronary ostium.  Multiple views of the left system were taken.  Next, the catheter was disengaged and was pulled out over the wire and was replaced with 4-French 3D right diagnostic catheter, which was advanced over the wire under fluoroscopic guidance up to the ascending aorta. Wire was pulled out, the catheter was aspirated and connected to the Manifold.  Catheter was further advanced and engaged into the right coronary ostium.  Multiple views of the right system were taken.  Next, catheter was disengaged and was pulled out over the wire and was replaced with 4-French pigtail catheter which was advanced over the wire under fluoroscopic guidance up to the ascending aorta.  Wire was pulled out.  The catheter was aspirated and connected to the Manifold. Catheter was further advanced across the aortic valve into the LV.  LV pressures were recorded.  Next, LV graft was done in 30-degree RAO position.  Post-angiographic pressures were recorded from LV and then pullback pressures were recorded from the aorta.  There was no gradient across the aortic valve.  Next, pigtail  catheter was pulled out over the wire.  Sheaths were aspirated and flushed.  FINDINGS:  LV showed good LV systolic function, LVH EF of 55% to 60%. Left main was patent.  LAD has a 30% to 40% mid stenosis just prior to the stents, stented mid segment was widely patent.  Diagonal 1 and 2 were very small.  Left circumflex has 30% to 40% proximal sequential stenosis.  Stented midportion was widely patent.  High OM 1 was moderate size, which has 20% to 25% mid stenosis.  OM2 and OM3 were very small. OM4 was small, which was patent.   RCA has 15% to 20% mid and distal stenosis. PDA was very small. PLV branches were small which were patent.  The patient tolerated the procedure well.  There were no complications.  The patient was transferred to recovery room in stable condition.     Eduardo Osier. [name], M.D.     MNH/MEDQ  D:  12/05/2012  T:  12/05/2012  Job:  [id number]

## 2012-12-21 LAB — PACEMAKER DEVICE OBSERVATION

## 2013-03-22 ENCOUNTER — Other Ambulatory Visit: Payer: Self-pay | Admitting: Cardiovascular Disease

## 2013-03-22 DIAGNOSIS — I495 Sick sinus syndrome: Secondary | ICD-10-CM

## 2013-03-22 LAB — PACEMAKER DEVICE OBSERVATION

## 2013-03-27 ENCOUNTER — Other Ambulatory Visit: Payer: Self-pay | Admitting: Cardiovascular Disease

## 2013-04-11 ENCOUNTER — Encounter: Payer: Self-pay | Admitting: Cardiovascular Disease

## 2013-04-11 ENCOUNTER — Ambulatory Visit (INDEPENDENT_AMBULATORY_CARE_PROVIDER_SITE_OTHER): Payer: Medicare PPO | Admitting: Cardiovascular Disease

## 2013-04-11 VITALS — BP 162/82 | HR 60 | Resp 20 | Ht 60.0 in | Wt 134.0 lb

## 2013-04-11 DIAGNOSIS — I10 Essential (primary) hypertension: Secondary | ICD-10-CM

## 2013-04-11 DIAGNOSIS — I2510 Atherosclerotic heart disease of native coronary artery without angina pectoris: Secondary | ICD-10-CM | POA: Insufficient documentation

## 2013-04-11 DIAGNOSIS — E785 Hyperlipidemia, unspecified: Secondary | ICD-10-CM | POA: Insufficient documentation

## 2013-04-11 DIAGNOSIS — Z950 Presence of cardiac pacemaker: Secondary | ICD-10-CM | POA: Insufficient documentation

## 2013-04-11 LAB — REMOTE PACEMAKER DEVICE
AL AMPLITUDE: 2.1 mv
AL IMPEDENCE PM: 560 Ohm
AL THRESHOLD: 0.625 V
ATRIAL PACING PM: 86
BAMS-0001: 150 {beats}/min
BAMS-0003: 70 {beats}/min
BATTERY VOLTAGE: 2.93 V
DEVICE MODEL PM: 2561895
RV LEAD AMPLITUDE: 12 mv
RV LEAD IMPEDENCE PM: 460 Ohm
RV LEAD THRESHOLD: 0.625 V
VENTRICULAR PACING PM: 25

## 2013-04-11 NOTE — Patient Instructions (Addendum)
Your physician recommends that you schedule a follow-up appointment in: 1 year for pacemaker follow-up. Your physician has recommended you make the following change in your medication: increase carvedilol to 12.5 mg twice a day

## 2013-04-12 NOTE — Assessment & Plan Note (Signed)
Her blood pressure is acceptably high today even after several minutes at rest. I have advised that she increase her carvedilol to 12.5 mg twice daily

## 2013-04-12 NOTE — Assessment & Plan Note (Signed)
On Crestor therapy. There is no recent lipid profile available for review.

## 2013-04-12 NOTE — Assessment & Plan Note (Addendum)
[employer] Jude Accent DR RF 2210 dual chamber device, implanted September 2011, monitored remotely via [employer]. Last download from May 24 showed normal lead parameters and battery longevity estimated to be around 7 years. There was 86% atrial pacing and 25% ventricular pacing. Rare and brief episodes of mode switch were noted. The longest episode lasted for 1 minute and 42 seconds. These episodes appear to represent atrial fibrillation. Because of the absence of a history of cardioembolic events or stroke, her advanced age and the relatively low prevalence of atrial arrhythmias aspirin is preferable to warfarin. She also has a history of GI bleeding.

## 2013-04-12 NOTE — Progress Notes (Signed)
Patient ID: [name], female   DOB: 13-Oct-1918, 79 y.o.   MRN: [id number]      Reason for office visit Pacemaker therapy, paroxysmal H. fibrillation, coronary artery disease  [name] is now 79 years old and has no cardiovascular complaints today. She does have discomfort in her left shoulder and is unable to abductus beyond an approximately 30 angle. She demonstrates this by lifting her left arm passively with the right arm. The problem is not pain but actual inability to raise her shoulder. She denies any anginal chest pain dizziness lightheadedness syncope palpitations or bleeding problems recently.    No Known Allergies  Current Outpatient Prescriptions  Medication Sig Dispense Refill  . amLODipine (NORVASC) 5 MG tablet Take 5 mg by mouth daily.      Marland Kitchen aspirin 81 MG EC tablet Take 81 mg by mouth every morning.       . bimatoprost (LUMIGAN) 0.03 % ophthalmic solution Place 1 drop into the left eye at bedtime.       . carvedilol (COREG) 6.25 MG tablet Take 12.5 mg by mouth 2 (two) times daily with a meal.       . clopidogrel (PLAVIX) 75 MG tablet Take 75 mg by mouth daily.      . dorzolamide (TRUSOPT) 2 % ophthalmic solution Place 1 drop into the right eye 3 (three) times daily.       . famotidine (PEPCID) 20 MG tablet Take 20 mg by mouth 2 (two) times daily.       . nepafenac (NEVANAC) 0.1 % ophthalmic suspension Place 1 drop into the right eye at bedtime.       . nitroGLYCERIN (NITRODUR - DOSED IN MG/24 HR) 0.2 mg/hr Place 1 patch onto the skin every morning. Apply to chest wall & remove at night      . nitroGLYCERIN (NITROSTAT) 0.4 MG SL tablet Place 0.4 mg under the tongue every 5 (five) minutes as needed. For chest pain      . prednisoLONE acetate (PRED FORTE) 1 % ophthalmic suspension Place 1 drop into the left eye 3 (three) times daily.       . ramipril (ALTACE) 10 MG capsule Take 10 mg by mouth 2 (two) times daily.        . rosuvastatin (CRESTOR) 5 MG tablet Take 5 mg  by mouth daily.      . folic acid (FOLVITE) 1 MG tablet TAKE ONE TABLET BY MOUTH ONE TIME DAILY  30 tablet  2   No current facility-administered medications for this visit.    Past Medical History  Diagnosis Date  . Hypertension   . Coronary artery disease   . Myocardial infarction     x 3  . Angina     on nitro-dur  . Shortness of breath     with exertion  . GERD (gastroesophageal reflux disease)   . Hyperlipidemia   . Glaucoma   . History of eye surgery     bilateral   . Anemia   . Pacemaker     Past Surgical History  Procedure Laterality Date  . Cardiac catherization  06/2010  . Cardiac catheterization    . Pacemaker insertion  06/2010  . Svd      x 2  . Appendectomy    . Thyroidectomy    . Hysteroscopy w/d&c  09/07/2011    Procedure: DILATATION AND CURETTAGE (D&C) /HYSTEROSCOPY;  Surgeon: Turner Daniels, MD;  Location: [facility];  Service: Gynecology;  Laterality: N/A;  uterus  . Coronary angioplasty with stent placement  02/13/2012    No family history on file.  History   Social History  . Marital Status: Widowed    Spouse Name: N/A    Number of Children: N/A  . Years of Education: N/A   Occupational History  . Not on file.   Social History Main Topics  . Smoking status: Former Smoker -- 0.50 packs/day    Types: Cigarettes    Quit date: 09/05/1991  . Smokeless tobacco: Never Used  . Alcohol Use: No     Comment: none since 1992  . Drug Use: No  . Sexually Active: Not Currently   Other Topics Concern  . Not on file   Social History Narrative  . No narrative on file    Review of systems: The patient specifically denies any chest pain at rest or with exertion, dyspnea at rest or with exertion, orthopnea, paroxysmal nocturnal dyspnea, syncope, palpitations, focal neurological deficits, intermittent claudication, lower extremity edema, unexplained weight gain, cough, hemoptysis or wheezing.  The patient also denies abdominal pain, nausea, vomiting,  dysphagia, diarrhea, constipation, polyuria, polydipsia, dysuria, hematuria, frequency, urgency, abnormal bleeding or bruising, fever, chills, unexpected weight changes, mood swings, change in skin or hair texture, change in voice quality, auditory or visual problems, allergic reactions or rashes, new musculoskeletal complaints other than described above   PHYSICAL EXAM BP 162/82  Pulse 60  Resp 20  Ht 5' (1.524 m)  Wt 134 lb (60.782 kg)  BMI 26.17 kg/m2  General: Alert, oriented x3, no distress Head: no evidence of trauma, PERRL, EOMI, no exophtalmos or lid lag, no myxedema, no xanthelasma; normal ears, nose and oropharynx Neck: normal jugular venous pulsations and no hepatojugular reflux; brisk carotid pulses without delay and no carotid bruits Chest: clear to auscultation, no signs of consolidation by percussion or palpation, normal fremitus, symmetrical and full respiratory excursions;  Cardiovascular: normal position and quality of the apical impulse, regular rhythm, normal first and second heart sounds, no murmurs, rubs or gallops Abdomen: no tenderness or distention, no masses by palpation, no abnormal pulsatility or arterial bruits, normal bowel sounds, no hepatosplenomegaly Extremities: no clubbing, cyanosis or edema; 2+ radial, ulnar and brachial pulses bilaterally; 2+ right femoral, posterior tibial and dorsalis pedis pulses; 2+ left femoral, posterior tibial and dorsalis pedis pulses; no subclavian or femoral bruits Neurological: grossly nonfocal   EKG: Atrial ventricular sequential pacing  Lipid Panel     Component Value Date/Time   CHOL  Value: 185        ATP III CLASSIFICATION:  <200     mg/dL   Desirable  161-096  mg/dL   Borderline High  >=045    mg/dL   High        02/05/8118 0600   TRIG 43 07/20/2010 0600   HDL 65 07/20/2010 0600   CHOLHDL 2.8 07/20/2010 0600   VLDL 9 07/20/2010 0600   LDLCALC  Value: 111        Total Cholesterol/HDL:CHD Risk Coronary Heart Disease Risk  Table                     Men   Women  1/2 Average Risk   3.4   3.3  Average Risk       5.0   4.4  2 X Average Risk   9.6   7.1  3 X Average Risk  23.4   11.0  Use the calculated Patient Ratio above and the CHD Risk Table to determine the patient's CHD Risk.        ATP III CLASSIFICATION (LDL):  <100     mg/dL   Optimal  130-865  mg/dL   Near or Above                    Optimal  130-159  mg/dL   Borderline  784-696  mg/dL   High  >295     mg/dL   Very High* 2/84/1324 0600    BMET    Component Value Date/Time   NA 142 05/23/2012 2210   K 3.8 05/23/2012 2210   CL 109 05/23/2012 2210   CO2 23 05/23/2012 2210   GLUCOSE 98 05/23/2012 2210   BUN 11 05/23/2012 2210   CREATININE 0.82 05/23/2012 2210   CALCIUM 9.2 05/23/2012 2210   GFRNONAA 61* 05/23/2012 2210   GFRAA 70* 05/23/2012 2210     ASSESSMENT AND PLAN CAD (coronary artery disease) She is followed by Dr. [name] for this. She is currently asymptomatic. It should be noted that she had an abnormal nuclear stress test in March of 2013 suggesting ischemia in the distal anterolateral and proximal inferolateral walls. 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 She has a previously placed mid LAD drug-eluting stent since September 2000.  HTN (hypertension) Her blood pressure is acceptably high today even after several minutes at rest. I have advised that she increase her carvedilol to 12.5 mg twice daily  Pacemaker St. Jude Accent DR RF 2210 dual chamber device, implanted September 2011, monitored remotely via [employer]. Last download from May 24 showed normal lead parameters and battery longevity estimated to be around 7 years. There was 86% atrial pacing and 25% ventricular pacing. Rare and brief episodes of mode switch were noted. The longest episode lasted for 1 minute and 42 seconds. These episodes appear to  represent atrial fibrillation. Because of the absence of a history of cardioembolic events or stroke, her advanced age and the relatively low prevalence of atrial arrhythmias aspirin is preferable to warfarin. She also has a history of GI bleeding.   Hyperlipidemia On Crestor therapy. There is no recent lipid profile available for review.  Her left shoulder complaint suggests rotator cuff tear. Have advised that she discuss this with an orthopedic surgeon. She is already seeing a specialist for problems with her hand.  Orders Placed This Encounter  Procedures  . EKG 12-Lead   Meds ordered this encounter  Medications  . carvedilol (COREG) 6.25 MG tablet    Sig: Take 12.5 mg by mouth 2 (two) times daily with a meal.   . rosuvastatin (CRESTOR) 5 MG tablet    Sig: Take 5 mg by mouth daily.  Marland Kitchen amLODipine (NORVASC) 5 MG tablet    Sig: Take 5 mg by mouth daily.    Junious Silk, MD, [facility] [phone] office [phone] pager

## 2013-04-12 NOTE — Assessment & Plan Note (Signed)
She is followed by Dr. [name] for this. She is currently asymptomatic. It should be noted that she had an abnormal nuclear stress test in March of 2013 suggesting ischemia in the distal anterolateral and proximal inferolateral walls. 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 She has a previously placed mid LAD drug-eluting stent since September 2000.

## 2013-04-14 ENCOUNTER — Telehealth: Payer: Self-pay | Admitting: Cardiovascular Disease

## 2013-04-14 NOTE — Telephone Encounter (Signed)
Still have not gotten her blood presssure medicine-she is not at pt's house-She said her mother needs her blood pressure medicine-Call to [facility] on [address]!

## 2013-04-14 NOTE — Telephone Encounter (Signed)
Unable to reach dtr about which BP med she was out of.  Called [facility] to look up her profile.  New Rx called in for Carvedilol 12.5mg  BID #60 x 6 refills and Amlodipine 5mg  QD #30 x 6 refills

## 2013-04-15 ENCOUNTER — Telehealth: Payer: Self-pay | Admitting: Cardiovascular Disease

## 2013-04-15 NOTE — Telephone Encounter (Signed)
Returned call.  Staff at [facility] stated pt thought that her Rxs were being sent to their pharmacy, but wasn't sure if they were sent to [facility].  Staff informed pt left a message before closing yesterday that she needs her BP medicine and wants it sent to [facility].  Informed CMA called in refills on carvedilol and amlodipine last evening and was unable to reach pt.  Staff stated she will call pt to let them know.

## 2013-04-15 NOTE — Telephone Encounter (Signed)
Was patient's medicatin called to [facility] or is the refill still pending?  Patient was not sure what pharmacy she called.

## 2013-04-16 ENCOUNTER — Other Ambulatory Visit: Payer: Self-pay | Admitting: Pharmacist Clinician (PhC)/ Clinical Pharmacy Specialist

## 2013-04-16 ENCOUNTER — Other Ambulatory Visit: Payer: Self-pay | Admitting: Cardiovascular Disease

## 2013-04-16 MED ORDER — CARVEDILOL 12.5 MG PO TABS: 12.5000 mg | ORAL_TABLET | Freq: Two times a day (BID) | ORAL | Status: DC

## 2013-05-06 ENCOUNTER — Encounter: Payer: Self-pay | Admitting: Cardiovascular Disease

## 2013-06-21 ENCOUNTER — Other Ambulatory Visit: Payer: Self-pay | Admitting: Cardiovascular Disease

## 2013-06-21 DIAGNOSIS — I495 Sick sinus syndrome: Secondary | ICD-10-CM

## 2013-06-21 LAB — PACEMAKER DEVICE OBSERVATION

## 2013-07-03 LAB — REMOTE PACEMAKER DEVICE
AL AMPLITUDE: 2 mv
AL IMPEDENCE PM: 560 Ohm
AL THRESHOLD: 0.625 V
ATRIAL PACING PM: 88
BAMS-0001: 150 {beats}/min
BAMS-0003: 70 {beats}/min
BATTERY VOLTAGE: 2.93 V
DEVICE MODEL PM: 2561895
RV LEAD AMPLITUDE: 12 mv
RV LEAD IMPEDENCE PM: 440 Ohm
RV LEAD THRESHOLD: 0.75 V
VENTRICULAR PACING PM: 33

## 2013-08-19 ENCOUNTER — Other Ambulatory Visit: Payer: Self-pay

## 2013-08-19 DIAGNOSIS — Z1231 Encounter for screening mammogram for malignant neoplasm of breast: Secondary | ICD-10-CM

## 2013-09-09 ENCOUNTER — Ambulatory Visit
Admission: RE | Admit: 2013-09-09 | Discharge: 2013-09-09 | Disposition: A | Discharge status: Home / Self Care | Payer: Medicare PPO | Source: Ambulatory Visit

## 2013-09-09 DIAGNOSIS — Z1231 Encounter for screening mammogram for malignant neoplasm of breast: Secondary | ICD-10-CM

## 2013-09-20 LAB — PACEMAKER DEVICE OBSERVATION

## 2013-09-22 ENCOUNTER — Ambulatory Visit (INDEPENDENT_AMBULATORY_CARE_PROVIDER_SITE_OTHER): Payer: Medicare PPO

## 2013-09-22 DIAGNOSIS — I495 Sick sinus syndrome: Secondary | ICD-10-CM

## 2013-09-24 ENCOUNTER — Encounter: Payer: Self-pay | Admitting: *Deleted

## 2013-09-24 LAB — MDC_IDC_ENUM_SESS_TYPE_REMOTE
Battery Voltage: 2.93 V
Brady Statistic RA Percent Paced: 89 %
Brady Statistic RV Percent Paced: 35 %
Implantable Pulse Generator Model: 2210
Implantable Pulse Generator Serial Number: 2561895
Lead Channel Impedance Value: 510 Ohm
Lead Channel Impedance Value: 580 Ohm
Lead Channel Pacing Threshold Amplitude: 0.625 V
Lead Channel Pacing Threshold Amplitude: 0.75 V
Lead Channel Pacing Threshold Pulse Width: 0.4 ms
Lead Channel Pacing Threshold Pulse Width: 0.4 ms
Lead Channel Sensing Intrinsic Amplitude: 1.5 mV
Lead Channel Sensing Intrinsic Amplitude: 12 mV
Lead Channel Setting Pacing Amplitude: 0.875
Lead Channel Setting Pacing Amplitude: 2 V
Lead Channel Setting Pacing Pulse Width: 0.4 ms
Lead Channel Setting Sensing Sensitivity: 2 mV

## 2013-11-28 ENCOUNTER — Other Ambulatory Visit: Payer: Self-pay | Admitting: Cardiovascular Disease

## 2013-12-01 NOTE — Telephone Encounter (Signed)
Rx was sent to pharmacy electronically. 

## 2013-12-22 ENCOUNTER — Ambulatory Visit (INDEPENDENT_AMBULATORY_CARE_PROVIDER_SITE_OTHER): Payer: Medicare PPO | Admitting: *Deleted

## 2013-12-22 DIAGNOSIS — I495 Sick sinus syndrome: Secondary | ICD-10-CM

## 2013-12-22 LAB — MDC_IDC_ENUM_SESS_TYPE_REMOTE
Battery Remaining Longevity: 88 mo
Battery Voltage: 2.93 V
Brady Statistic AP VP Percent: 35 %
Brady Statistic AP VS Percent: 55 %
Brady Statistic AS VP Percent: 1 %
Brady Statistic AS VS Percent: 9.3 %
Brady Statistic RA Percent Paced: 89 %
Brady Statistic RV Percent Paced: 35 %
Date Time Interrogation Session: 20150223070013
Implantable Pulse Generator Model: 2210
Implantable Pulse Generator Serial Number: 2561895
Lead Channel Impedance Value: 490 Ohm
Lead Channel Impedance Value: 560 Ohm
Lead Channel Pacing Threshold Amplitude: 0.625 V
Lead Channel Pacing Threshold Amplitude: 0.625 V
Lead Channel Pacing Threshold Pulse Width: 0.4 ms
Lead Channel Pacing Threshold Pulse Width: 0.4 ms
Lead Channel Sensing Intrinsic Amplitude: 12 mV
Lead Channel Sensing Intrinsic Amplitude: 2 mV
Lead Channel Setting Pacing Amplitude: 0.875
Lead Channel Setting Pacing Amplitude: 2 V
Lead Channel Setting Pacing Pulse Width: 0.4 ms
Lead Channel Setting Sensing Sensitivity: 2 mV

## 2014-01-06 ENCOUNTER — Encounter: Payer: Self-pay | Admitting: *Deleted

## 2014-01-13 ENCOUNTER — Encounter: Payer: Self-pay | Admitting: Cardiovascular Disease

## 2014-04-18 ENCOUNTER — Other Ambulatory Visit: Payer: Self-pay | Admitting: Cardiovascular Disease

## 2014-04-20 NOTE — Telephone Encounter (Signed)
Rx was sent to pharmacy electronically. 

## 2014-06-09 ENCOUNTER — Encounter: Payer: Self-pay | Admitting: Cardiovascular Disease

## 2014-06-09 ENCOUNTER — Ambulatory Visit (INDEPENDENT_AMBULATORY_CARE_PROVIDER_SITE_OTHER): Payer: Medicare PPO | Admitting: Cardiovascular Disease

## 2014-06-09 VITALS — BP 146/72 | HR 60 | Resp 20 | Ht 60.0 in | Wt 129.9 lb

## 2014-06-09 DIAGNOSIS — I2510 Atherosclerotic heart disease of native coronary artery without angina pectoris: Secondary | ICD-10-CM

## 2014-06-09 DIAGNOSIS — R0789 Other chest pain: Secondary | ICD-10-CM

## 2014-06-09 DIAGNOSIS — Z950 Presence of cardiac pacemaker: Secondary | ICD-10-CM

## 2014-06-09 DIAGNOSIS — I495 Sick sinus syndrome: Secondary | ICD-10-CM

## 2014-06-09 DIAGNOSIS — I25119 Atherosclerotic heart disease of native coronary artery with unspecified angina pectoris: Secondary | ICD-10-CM

## 2014-06-09 DIAGNOSIS — I209 Angina pectoris, unspecified: Secondary | ICD-10-CM

## 2014-06-09 LAB — MDC_IDC_ENUM_SESS_TYPE_INCLINIC
Battery Remaining Percentage: 73 %
Battery Voltage: 2.92 V
Brady Statistic RA Percent Paced: 90 %
Brady Statistic RV Percent Paced: 39 %
Implantable Pulse Generator Model: 2210
Implantable Pulse Generator Serial Number: 2561895
Lead Channel Impedance Value: 490 Ohm
Lead Channel Impedance Value: 540 Ohm
Lead Channel Pacing Threshold Amplitude: 0.625 V
Lead Channel Pacing Threshold Amplitude: 0.625 V
Lead Channel Pacing Threshold Pulse Width: 0.4 ms
Lead Channel Pacing Threshold Pulse Width: 0.4 ms
Lead Channel Sensing Intrinsic Amplitude: 1.3 mV
Lead Channel Sensing Intrinsic Amplitude: 12 mV
Lead Channel Setting Pacing Amplitude: 0.875
Lead Channel Setting Pacing Amplitude: 2 V
Lead Channel Setting Pacing Pulse Width: 0.4 ms
Lead Channel Setting Sensing Sensitivity: 2 mV

## 2014-06-09 NOTE — Assessment & Plan Note (Signed)
She has a known moderate to severe coronary stenosis and it is not unexpected that she'll have occasional chest discomfort, especially when active. Recommended that she seek emergency medical attention for an episode of chest discomfort lasting more than 20-30 minutes, not relieved by sublingual nitroglycerin. She tells and she has an appointment coming up with Dr. Sharyn LullHarwani in the next couple of weeks.

## 2014-06-09 NOTE — Progress Notes (Signed)
Patient ID: [name], female   DOB: 07/19/1914, 80 y.o.   MRN: [id number]      Reason for office visit Atrial fibrillation, C oAD, pacemaker check  [name] is a 80 year old woman that remains physically active. She is currently helping to cook for a large family reunion. She has a dual-chamber [employer] Jude permanent pacemaker that was implanted in 2011 for sinus node dysfunction. It has occasionally recorded brief episodes of paroxysmal atrial fibrillation. As of her advanced age and history of gastrointestinal bleeding she is on aspirin for embolism prophylaxis. She does not have a history of stroke or TIA.  Interrogation of her pacemaker today shows normal function. She has underlying sinus bradycardia and paces the atrium 90%. She has a very long AV conduction time and despite MVP still has roughly 30% ventricular pacing. Today her paced AV conduction time is 416 ms. Intermittently she has a left bundle branch block morphology. The longest episode of recorded atrial fibrillation was approximately 10 minutes. No significant episodes have been recorded since March of 2015.  Also has coronary artery disease and has a known 70-75% stenosis in her left circumflex coronary artery. She has treated hypertension and hyperlipidemia  This weekend she had a longer episode of intrascapular discomfort for which she took 3 nitroglycerin tablets. She was very busy cooking at the time. She has not had any angina since.   No Known Allergies  Current Outpatient Prescriptions  Medication Sig Dispense Refill  . amLODipine (NORVASC) 5 MG tablet Take 5 mg by mouth daily.      Marland Kitchen aspirin 81 MG EC tablet Take 81 mg by mouth every morning.       . bimatoprost (LUMIGAN) 0.03 % ophthalmic solution Place 1 drop into the left eye at bedtime.       . carvedilol (COREG) 12.5 MG tablet TAKE 1 TABLET BY MOUTH TWICE DAILY WITH A MEAL  60 tablet  2  . clopidogrel (PLAVIX) 75 MG tablet Take 75 mg by mouth daily.        . dorzolamide (TRUSOPT) 2 % ophthalmic solution Place 1 drop into the right eye 3 (three) times daily.       . famotidine (PEPCID) 20 MG tablet Take 20 mg by mouth 2 (two) times daily.       . folic acid (FOLVITE) 1 MG tablet TAKE 1 TABLET BY MOUTH EVERY DAY  30 tablet  4  . nepafenac (NEVANAC) 0.1 % ophthalmic suspension Place 1 drop into the right eye at bedtime.       . nitroGLYCERIN (NITRODUR - DOSED IN MG/24 HR) 0.2 mg/hr Place 1 patch onto the skin every morning. Apply to chest wall & remove at night      . nitroGLYCERIN (NITROSTAT) 0.4 MG SL tablet Place 0.4 mg under the tongue every 5 (five) minutes as needed. For chest pain      . ramipril (ALTACE) 10 MG capsule Take 10 mg by mouth 2 (two) times daily.        . rosuvastatin (CRESTOR) 5 MG tablet Take 5 mg by mouth daily.      . furosemide (LASIX) 20 MG tablet Take 20 mg by mouth daily.       No current facility-administered medications for this visit.    Past Medical History  Diagnosis Date  . Hypertension   . Coronary artery disease   . Myocardial infarction     x 3  . Angina     on nitro-dur  .  Shortness of breath     with exertion  . GERD (gastroesophageal reflux disease)   . Hyperlipidemia   . Glaucoma   . History of eye surgery     bilateral   . Anemia   . Pacemaker     Past Surgical History  Procedure Laterality Date  . Cardiac catherization  06/2010  . Cardiac catheterization    . Pacemaker insertion  06/2010  . Svd      x 2  . Appendectomy    . Thyroidectomy    . Hysteroscopy w/d&c  09/07/2011    Procedure: DILATATION AND CURETTAGE (D&C) /HYSTEROSCOPY;  Surgeon: Turner Danielsavid C Lowe, MD;  Location: [facility];  Service: Gynecology;  Laterality: N/A;  uterus  . Coronary angioplasty with stent placement  02/13/2012    No family history on file.  History   Social History  . Marital Status: Widowed    Spouse Name: N/A    Number of Children: N/A  . Years of Education: N/A   Occupational History  . Not on file.    Social History Main Topics  . Smoking status: Former Smoker -- 0.50 packs/day    Types: Cigarettes    Quit date: 09/05/1991  . Smokeless tobacco: Never Used  . Alcohol Use: No     Comment: none since 1992  . Drug Use: No  . Sexual Activity: Not Currently   Other Topics Concern  . Not on file   Social History Narrative  . No narrative on file    Review of systems: The patient specifically denies dyspnea at rest or with exertion, orthopnea, paroxysmal nocturnal dyspnea, syncope, palpitations, focal neurological deficits, intermittent claudication, lower extremity edema, unexplained weight gain, cough, hemoptysis or wheezing.  The patient also denies abdominal pain, nausea, vomiting, dysphagia, diarrhea, constipation, polyuria, polydipsia, dysuria, hematuria, frequency, urgency, abnormal bleeding or bruising, fever, chills, unexpected weight changes, mood swings, change in skin or hair texture, change in voice quality, auditory or visual problems, allergic reactions or rashes, new musculoskeletal complaints other than usual "aches and pains".   PHYSICAL EXAM BP 146/72  Pulse 60  Resp 20  Ht 5' (1.524 m)  Wt 129 lb 14.4 oz (58.922 kg)  BMI 25.37 kg/m2  General: Alert, oriented x3, no distress Head: no evidence of trauma, PERRL, EOMI, no exophtalmos or lid lag, no myxedema, no xanthelasma; normal ears, nose and oropharynx Neck: normal jugular venous pulsations and no hepatojugular reflux; brisk carotid pulses without delay and no carotid bruits Chest: clear to auscultation, no signs of consolidation by percussion or palpation, normal fremitus, symmetrical and full respiratory excursions, left subclavian pacemaker site appears healthy Cardiovascular: normal position and quality of the apical impulse, regular rhythm, normal first and second heart sounds, no murmurs, rubs or gallops Abdomen: no tenderness or distention, no masses by palpation, no abnormal pulsatility or arterial  bruits, normal bowel sounds, no hepatosplenomegaly Extremities: no clubbing, cyanosis or edema; 2+ radial, ulnar and brachial pulses bilaterally; 2+ right femoral, posterior tibial and dorsalis pedis pulses; 2+ left femoral, posterior tibial and dorsalis pedis pulses; no subclavian or femoral bruits Neurological: grossly nonfocal   EKG: Atrial paced, ventricular sensed, very long AV conduction time 416 ms, T wave inversion across inferior and anterolateral leads. These may represent ischemia or "pacemaker memory T waves". Comparison with previous tracings is nondiagnostic since she has had ventricular pacing or left bundle branch block morphology on recent electrocardiograms  Lipid Panel     Component Value Date/Time   CHOL  Value: 185        ATP III CLASSIFICATION:  <200     mg/dL   Desirable  161-096  mg/dL   Borderline High  >=045    mg/dL   High        02/05/8118 0600   TRIG 43 07/20/2010 0600   HDL 65 07/20/2010 0600   CHOLHDL 2.8 07/20/2010 0600   VLDL 9 07/20/2010 0600   LDLCALC  Value: 111        Total Cholesterol/HDL:CHD Risk Coronary Heart Disease Risk Table                     Men   Women  1/2 Average Risk   3.4   3.3  Average Risk       5.0   4.4  2 X Average Risk   9.6   7.1  3 X Average Risk  23.4   11.0        Use the calculated Patient Ratio above and the CHD Risk Table to determine the patient's CHD Risk.        ATP III CLASSIFICATION (LDL):  <100     mg/dL   Optimal  147-829  mg/dL   Near or Above                    Optimal  130-159  mg/dL   Borderline  562-130  mg/dL   High  >865     mg/dL   Very High* 7/84/6962 0600    BMET    Component Value Date/Time   NA 142 05/23/2012 2210   K 3.8 05/23/2012 2210   CL 109 05/23/2012 2210   CO2 23 05/23/2012 2210   GLUCOSE 98 05/23/2012 2210   BUN 11 05/23/2012 2210   CREATININE 0.82 05/23/2012 2210   CALCIUM 9.2 05/23/2012 2210   GFRNONAA 61* 05/23/2012 2210   GFRAA 70* 05/23/2012 2210     ASSESSMENT AND PLAN Pacemaker St. Jude Accent DR  RF 2210 dual chamber device, implanted September 2011, sinus node dysfunction. Device function is normal. No permanent programming changes were necessary today. Continue with remote monitoring every 3 months in followup in the office in one year.  CAD (coronary artery disease) She has a known moderate to severe coronary stenosis and it is not unexpected that she'll have occasional chest discomfort, especially when active. Recommended that she seek emergency medical attention for an episode of chest discomfort lasting more than 20-30 minutes, not relieved by sublingual nitroglycerin. She tells and she has an appointment coming up with Dr. [name] in the next couple of weeks.   Orders Placed This Encounter  Procedures  . EKG 12-Lead   Meds ordered this encounter  Medications  . furosemide (LASIX) 20 MG tablet    Sig: Take 20 mg by mouth daily.    Junious Silk, MD, [facility] [phone] office [phone] pager

## 2014-06-09 NOTE — Assessment & Plan Note (Signed)
St. Jude Accent DR RF 2210 dual chamber device, implanted September 2011, sinus node dysfunction. Device function is normal. No permanent programming changes were necessary today. Continue with remote monitoring every 3 months in followup in the office in one year.

## 2014-06-09 NOTE — Patient Instructions (Addendum)
Remote monitoring is used to monitor your pacemaker from home. This monitoring reduces the number of office visits required to check your device to one time per year. It allows us to keep an eye on the functioning of your device to ensure it is working properly. You are scheduled for a device check from home on 09-10-2014. You may send your transmission at any time that day. If you have a wireless device, the transmission will be sent automatically. After your physician reviews your transmission, you will receive a postcard with your next transmission date.  Your physician recommends that you schedule a follow-up appointment in: 12 months with Dr.Croitoru  Please go to ER if you have chest pains lasting longer than 30 minutes or if you have taken 3 nitroglycerin tablets (spaced out 5 mins apart) without relief.

## 2014-07-13 ENCOUNTER — Encounter: Payer: Self-pay | Admitting: Cardiology

## 2014-07-27 ENCOUNTER — Other Ambulatory Visit: Payer: Self-pay | Admitting: Cardiovascular Disease

## 2014-07-27 NOTE — Telephone Encounter (Signed)
Rx was sent to pharmacy electronically. 

## 2014-08-10 ENCOUNTER — Other Ambulatory Visit: Payer: Self-pay | Admitting: Cardiovascular Disease

## 2014-08-11 NOTE — Telephone Encounter (Signed)
Rx was sent to pharmacy electronically. 

## 2014-09-10 ENCOUNTER — Encounter: Payer: Self-pay | Admitting: Cardiovascular Disease

## 2014-09-10 ENCOUNTER — Ambulatory Visit (INDEPENDENT_AMBULATORY_CARE_PROVIDER_SITE_OTHER): Payer: Medicare PPO | Admitting: *Deleted

## 2014-09-10 DIAGNOSIS — I495 Sick sinus syndrome: Secondary | ICD-10-CM

## 2014-09-10 LAB — MDC_IDC_ENUM_SESS_TYPE_REMOTE
Battery Remaining Longevity: 82 mo
Battery Remaining Percentage: 63 %
Battery Voltage: 2.93 V
Brady Statistic AP VP Percent: 50 %
Brady Statistic AP VS Percent: 44 %
Brady Statistic AS VP Percent: 1 %
Brady Statistic AS VS Percent: 5.1 %
Brady Statistic RA Percent Paced: 94 %
Brady Statistic RV Percent Paced: 51 %
Date Time Interrogation Session: 20151112082050
Implantable Pulse Generator Model: 2210
Implantable Pulse Generator Serial Number: 2561895
Lead Channel Impedance Value: 510 Ohm
Lead Channel Impedance Value: 540 Ohm
Lead Channel Pacing Threshold Amplitude: 0.625 V
Lead Channel Pacing Threshold Amplitude: 0.625 V
Lead Channel Pacing Threshold Pulse Width: 0.4 ms
Lead Channel Pacing Threshold Pulse Width: 0.4 ms
Lead Channel Sensing Intrinsic Amplitude: 12 mV
Lead Channel Sensing Intrinsic Amplitude: 2.2 mV
Lead Channel Setting Pacing Amplitude: 0.875
Lead Channel Setting Pacing Amplitude: 2 V
Lead Channel Setting Pacing Pulse Width: 0.4 ms
Lead Channel Setting Sensing Sensitivity: 2 mV

## 2014-09-11 NOTE — Progress Notes (Signed)
Remote pacemaker transmission.   

## 2014-09-30 ENCOUNTER — Other Ambulatory Visit: Payer: Self-pay | Admitting: Family Medicine

## 2014-09-30 ENCOUNTER — Ambulatory Visit
Admission: RE | Admit: 2014-09-30 | Discharge: 2014-09-30 | Disposition: A | Discharge status: Home / Self Care | Payer: Medicare PPO | Source: Ambulatory Visit | Attending: Family Medicine | Admitting: Family Medicine

## 2014-09-30 DIAGNOSIS — M25552 Pain in left hip: Secondary | ICD-10-CM

## 2014-10-05 ENCOUNTER — Encounter: Payer: Self-pay | Admitting: Cardiology

## 2014-10-08 ENCOUNTER — Encounter (HOSPITAL_COMMUNITY): Payer: Self-pay | Admitting: Cardiology

## 2014-11-10 ENCOUNTER — Encounter: Payer: Self-pay | Admitting: Cardiovascular Disease

## 2014-12-16 ENCOUNTER — Encounter: Payer: Self-pay | Admitting: Cardiovascular Disease

## 2014-12-16 ENCOUNTER — Ambulatory Visit (INDEPENDENT_AMBULATORY_CARE_PROVIDER_SITE_OTHER): Payer: Medicare PPO | Admitting: *Deleted

## 2014-12-16 DIAGNOSIS — I495 Sick sinus syndrome: Secondary | ICD-10-CM

## 2014-12-16 LAB — MDC_IDC_ENUM_SESS_TYPE_REMOTE
Battery Remaining Longevity: 75 mo
Battery Remaining Percentage: 58 %
Battery Voltage: 2.92 V
Brady Statistic AP VP Percent: 55 %
Brady Statistic AP VS Percent: 39 %
Brady Statistic AS VP Percent: 1 %
Brady Statistic AS VS Percent: 6.3 %
Brady Statistic RA Percent Paced: 92 %
Brady Statistic RV Percent Paced: 55 %
Date Time Interrogation Session: 20160217085728
Implantable Pulse Generator Model: 2210
Implantable Pulse Generator Serial Number: 2561895
Lead Channel Impedance Value: 460 Ohm
Lead Channel Impedance Value: 540 Ohm
Lead Channel Pacing Threshold Amplitude: 0.625 V
Lead Channel Pacing Threshold Amplitude: 0.625 V
Lead Channel Pacing Threshold Pulse Width: 0.4 ms
Lead Channel Pacing Threshold Pulse Width: 0.4 ms
Lead Channel Sensing Intrinsic Amplitude: 12 mV
Lead Channel Sensing Intrinsic Amplitude: 2.2 mV
Lead Channel Setting Pacing Amplitude: 0.875
Lead Channel Setting Pacing Amplitude: 2 V
Lead Channel Setting Pacing Pulse Width: 0.4 ms
Lead Channel Setting Sensing Sensitivity: 2 mV

## 2014-12-16 NOTE — Progress Notes (Signed)
Remote pacemaker transmission.   

## 2014-12-17 ENCOUNTER — Emergency Department (HOSPITAL_COMMUNITY): Payer: Medicare PPO

## 2014-12-17 ENCOUNTER — Other Ambulatory Visit: Payer: Self-pay

## 2014-12-17 ENCOUNTER — Encounter (HOSPITAL_COMMUNITY): Payer: Self-pay | Admitting: Emergency Medicine

## 2014-12-17 ENCOUNTER — Observation Stay (HOSPITAL_COMMUNITY)
Admission: EM | Admit: 2014-12-17 | Discharge: 2014-12-19 | Disposition: A | Discharge status: Home / Self Care | Payer: Medicare PPO | Attending: Internal Medicine | Admitting: Internal Medicine

## 2014-12-17 DIAGNOSIS — K219 Gastro-esophageal reflux disease without esophagitis: Secondary | ICD-10-CM | POA: Insufficient documentation

## 2014-12-17 DIAGNOSIS — I252 Old myocardial infarction: Secondary | ICD-10-CM | POA: Insufficient documentation

## 2014-12-17 DIAGNOSIS — I2510 Atherosclerotic heart disease of native coronary artery without angina pectoris: Secondary | ICD-10-CM | POA: Diagnosis present

## 2014-12-17 DIAGNOSIS — Z950 Presence of cardiac pacemaker: Secondary | ICD-10-CM | POA: Diagnosis not present

## 2014-12-17 DIAGNOSIS — M1990 Unspecified osteoarthritis, unspecified site: Secondary | ICD-10-CM | POA: Diagnosis not present

## 2014-12-17 DIAGNOSIS — Z955 Presence of coronary angioplasty implant and graft: Secondary | ICD-10-CM | POA: Insufficient documentation

## 2014-12-17 DIAGNOSIS — R079 Chest pain, unspecified: Principal | ICD-10-CM

## 2014-12-17 DIAGNOSIS — Z87891 Personal history of nicotine dependence: Secondary | ICD-10-CM | POA: Insufficient documentation

## 2014-12-17 DIAGNOSIS — E780 Pure hypercholesterolemia: Secondary | ICD-10-CM | POA: Insufficient documentation

## 2014-12-17 DIAGNOSIS — Z7902 Long term (current) use of antithrombotics/antiplatelets: Secondary | ICD-10-CM | POA: Diagnosis not present

## 2014-12-17 DIAGNOSIS — Z79899 Other long term (current) drug therapy: Secondary | ICD-10-CM | POA: Diagnosis not present

## 2014-12-17 DIAGNOSIS — I10 Essential (primary) hypertension: Secondary | ICD-10-CM | POA: Diagnosis present

## 2014-12-17 DIAGNOSIS — I25119 Atherosclerotic heart disease of native coronary artery with unspecified angina pectoris: Secondary | ICD-10-CM

## 2014-12-17 DIAGNOSIS — E785 Hyperlipidemia, unspecified: Secondary | ICD-10-CM | POA: Diagnosis not present

## 2014-12-17 DIAGNOSIS — Z7982 Long term (current) use of aspirin: Secondary | ICD-10-CM | POA: Diagnosis not present

## 2014-12-17 LAB — URINALYSIS, ROUTINE W REFLEX MICROSCOPIC
Bilirubin Urine: NEGATIVE
Glucose, UA: NEGATIVE mg/dL
Hgb urine dipstick: NEGATIVE
Ketones, ur: NEGATIVE mg/dL
Leukocytes, UA: NEGATIVE
Nitrite: NEGATIVE
Protein, ur: NEGATIVE mg/dL
Specific Gravity, Urine: 1.015 (ref 1.005–1.030)
Urobilinogen, UA: 0.2 mg/dL (ref 0.0–1.0)
pH: 5.5 (ref 5.0–8.0)

## 2014-12-17 LAB — CBC
HCT: 36.4 % (ref 36.0–46.0)
Hemoglobin: 12 g/dL (ref 12.0–15.0)
MCH: 29.7 pg (ref 26.0–34.0)
MCHC: 33 g/dL (ref 30.0–36.0)
MCV: 90.1 fL (ref 78.0–100.0)
Platelets: 200 10*3/uL (ref 150–400)
RBC: 4.04 MIL/uL (ref 3.87–5.11)
RDW: 14.1 % (ref 11.5–15.5)
WBC: 5.4 10*3/uL (ref 4.0–10.5)

## 2014-12-17 LAB — BASIC METABOLIC PANEL
Anion gap: 6 (ref 5–15)
BUN: 13 mg/dL (ref 6–23)
CO2: 25 mmol/L (ref 19–32)
Calcium: 9.1 mg/dL (ref 8.4–10.5)
Chloride: 109 mmol/L (ref 96–112)
Creatinine, Ser: 0.97 mg/dL (ref 0.50–1.10)
GFR calc Af Amer: 55 mL/min — ABNORMAL LOW (ref >90)
GFR calc non Af Amer: 47 mL/min — ABNORMAL LOW (ref >90)
Glucose, Bld: 123 mg/dL — ABNORMAL HIGH (ref 70–99)
Potassium: 3.8 mmol/L (ref 3.5–5.1)
Sodium: 140 mmol/L (ref 135–145)

## 2014-12-17 LAB — HEPATIC FUNCTION PANEL
ALT: 12 U/L (ref 0–35)
AST: 17 U/L (ref 0–37)
Albumin: 3.9 g/dL (ref 3.5–5.2)
Alkaline Phosphatase: 48 U/L (ref 39–117)
Bilirubin, Direct: 0.1 mg/dL (ref 0.0–0.5)
Indirect Bilirubin: 0.5 mg/dL (ref 0.3–0.9)
Total Bilirubin: 0.6 mg/dL (ref 0.3–1.2)
Total Protein: 6.1 g/dL (ref 6.0–8.3)

## 2014-12-17 LAB — I-STAT CG4 LACTIC ACID, ED: Lactic Acid, Venous: 1.47 mmol/L (ref 0.5–2.0)

## 2014-12-17 LAB — I-STAT TROPONIN, ED: Troponin i, poc: 0.02 ng/mL (ref 0.00–0.08)

## 2014-12-17 LAB — LIPASE, BLOOD: Lipase: 32 U/L (ref 11–59)

## 2014-12-17 LAB — TROPONIN I: Troponin I: 0.04 ng/mL — ABNORMAL HIGH (ref <0.031)

## 2014-12-17 MED ORDER — RAMIPRIL 5 MG PO CAPS
10.0000 mg | ORAL_CAPSULE | Freq: Two times a day (BID) | ORAL | Status: DC
  Administered 2014-12-17 – 2014-12-19 (×4): 10 mg via ORAL
  Filled 2014-12-17 (×4): qty 2

## 2014-12-17 MED ORDER — DORZOLAMIDE HCL 2 % OP SOLN
1.0000 [drp] | Freq: Three times a day (TID) | OPHTHALMIC | Status: DC
  Administered 2014-12-17 – 2014-12-19 (×4): 1 [drp] via OPHTHALMIC
  Filled 2014-12-17: qty 10

## 2014-12-17 MED ORDER — MORPHINE SULFATE 2 MG/ML IJ SOLN: 2.0000 mg | INTRAMUSCULAR | Status: DC | PRN

## 2014-12-17 MED ORDER — FAMOTIDINE 20 MG PO TABS
20.0000 mg | ORAL_TABLET | Freq: Two times a day (BID) | ORAL | Status: DC
  Administered 2014-12-17 – 2014-12-19 (×4): 20 mg via ORAL
  Filled 2014-12-17 (×4): qty 1

## 2014-12-17 MED ORDER — LATANOPROST 0.005 % OP SOLN
1.0000 [drp] | Freq: Every day | OPHTHALMIC | Status: DC
  Administered 2014-12-17 – 2014-12-18 (×2): 1 [drp] via OPHTHALMIC
  Filled 2014-12-17: qty 2.5

## 2014-12-17 MED ORDER — SODIUM CHLORIDE 0.9 % IV BOLUS (SEPSIS)
500.0000 mL | Freq: Once | INTRAVENOUS | Status: AC
  Administered 2014-12-17: 500 mL via INTRAVENOUS

## 2014-12-17 MED ORDER — ASPIRIN EC 325 MG PO TBEC
325.0000 mg | DELAYED_RELEASE_TABLET | Freq: Every day | ORAL | Status: DC
  Administered 2014-12-18 – 2014-12-19 (×2): 325 mg via ORAL
  Filled 2014-12-17 (×2): qty 1

## 2014-12-17 MED ORDER — ROSUVASTATIN CALCIUM 10 MG PO TABS: 5.0000 mg | ORAL_TABLET | Freq: Every day | ORAL | Status: DC

## 2014-12-17 MED ORDER — CLOPIDOGREL BISULFATE 75 MG PO TABS
75.0000 mg | ORAL_TABLET | Freq: Every day | ORAL | Status: DC
  Administered 2014-12-18 – 2014-12-19 (×2): 75 mg via ORAL
  Filled 2014-12-17 (×2): qty 1

## 2014-12-17 MED ORDER — NITROGLYCERIN 0.4 MG SL SUBL: 0.4000 mg | SUBLINGUAL_TABLET | SUBLINGUAL | Status: DC | PRN

## 2014-12-17 MED ORDER — CARVEDILOL 12.5 MG PO TABS
12.5000 mg | ORAL_TABLET | Freq: Two times a day (BID) | ORAL | Status: DC
  Administered 2014-12-18 – 2014-12-19 (×3): 12.5 mg via ORAL
  Filled 2014-12-17 (×3): qty 1

## 2014-12-17 MED ORDER — ONDANSETRON HCL 4 MG/2ML IJ SOLN
4.0000 mg | Freq: Once | INTRAMUSCULAR | Status: AC
  Administered 2014-12-17: 4 mg via INTRAVENOUS
  Filled 2014-12-17: qty 2

## 2014-12-17 MED ORDER — AMLODIPINE BESYLATE 5 MG PO TABS
5.0000 mg | ORAL_TABLET | Freq: Every day | ORAL | Status: DC
  Administered 2014-12-17 – 2014-12-19 (×3): 5 mg via ORAL
  Filled 2014-12-17 (×3): qty 1

## 2014-12-17 MED ORDER — FUROSEMIDE 20 MG PO TABS
20.0000 mg | ORAL_TABLET | Freq: Every day | ORAL | Status: DC
  Administered 2014-12-18 – 2014-12-19 (×2): 20 mg via ORAL
  Filled 2014-12-17 (×2): qty 1

## 2014-12-17 MED ORDER — ACETAMINOPHEN 325 MG PO TABS
650.0000 mg | ORAL_TABLET | ORAL | Status: DC | PRN
  Filled 2014-12-17: qty 2

## 2014-12-17 MED ORDER — GI COCKTAIL ~~LOC~~
30.0000 mL | Freq: Four times a day (QID) | ORAL | Status: DC | PRN
  Administered 2014-12-17: 30 mL via ORAL
  Filled 2014-12-17: qty 30

## 2014-12-17 MED ORDER — FENTANYL CITRATE 0.05 MG/ML IJ SOLN
25.0000 ug | Freq: Once | INTRAMUSCULAR | Status: AC
  Administered 2014-12-17: 25 ug via INTRAVENOUS
  Filled 2014-12-17: qty 2

## 2014-12-17 MED ORDER — NITROGLYCERIN 0.2 MG/HR TD PT24
0.2000 mg | MEDICATED_PATCH | Freq: Every morning | TRANSDERMAL | Status: DC
  Administered 2014-12-18 – 2014-12-19 (×2): 0.2 mg via TRANSDERMAL
  Filled 2014-12-17 (×2): qty 1

## 2014-12-17 MED ORDER — ONDANSETRON HCL 4 MG/2ML IJ SOLN
4.0000 mg | Freq: Four times a day (QID) | INTRAMUSCULAR | Status: DC | PRN
  Administered 2014-12-17: 4 mg via INTRAVENOUS
  Filled 2014-12-17: qty 2

## 2014-12-17 MED ORDER — NEPAFENAC 0.1 % OP SUSP
1.0000 [drp] | Freq: Every day | OPHTHALMIC | Status: DC
  Administered 2014-12-17 – 2014-12-18 (×2): 1 [drp] via OPHTHALMIC
  Filled 2014-12-17: qty 3

## 2014-12-17 NOTE — ED Provider Notes (Signed)
I saw and evaluated the patient, reviewed the resident's note and I agree with the findings and plan.   EKG Interpretation   Date/Time:  Thursday December 17 2014 15:44:42 EST Ventricular Rate:  60 PR Interval:    QRS Duration: 96 QT Interval:  468 QTC Calculation: 468 R Axis:   14 Text Interpretation:  Atrial-paced rhythm Septal infarct , age  undetermined T wave abnormality, consider inferolateral ischemia Abnormal  ECG No previous ECGs available Confirmed by Kamrin Sibley  MD, Nyeemah Jennette [phone])  on 12/17/2014 4:37:47 PM      Results for orders placed or performed during the hospital encounter of 12/17/14  CBC  Result Value Ref Range   WBC 5.4 4.0 - 10.5 K/uL   RBC 4.04 3.87 - 5.11 MIL/uL   Hemoglobin 12.0 12.0 - 15.0 g/dL   HCT 60.4 54.0 - 98.1 %   MCV 90.1 78.0 - 100.0 fL   MCH 29.7 26.0 - 34.0 pg   MCHC 33.0 30.0 - 36.0 g/dL   RDW 19.1 47.8 - 29.5 %   Platelets 200 150 - 400 K/uL  Basic metabolic panel  Result Value Ref Range   Sodium 140 135 - 145 mmol/L   Potassium 3.8 3.5 - 5.1 mmol/L   Chloride 109 96 - 112 mmol/L   CO2 25 19 - 32 mmol/L   Glucose, Bld 123 (H) 70 - 99 mg/dL   BUN 13 6 - 23 mg/dL   Creatinine, Ser 6.21 0.50 - 1.10 mg/dL   Calcium 9.1 8.4 - 30.8 mg/dL   GFR calc non Af Amer 47 (L) >90 mL/min   GFR calc Af Amer 55 (L) >90 mL/min   Anion gap 6 5 - 15  Hepatic function panel  Result Value Ref Range   Total Protein 6.1 6.0 - 8.3 g/dL   Albumin 3.9 3.5 - 5.2 g/dL   AST 17 0 - 37 U/L   ALT 12 0 - 35 U/L   Alkaline Phosphatase 48 39 - 117 U/L   Total Bilirubin 0.6 0.3 - 1.2 mg/dL   Bilirubin, Direct 0.1 0.0 - 0.5 mg/dL   Indirect Bilirubin 0.5 0.3 - 0.9 mg/dL  Lipase, blood  Result Value Ref Range   Lipase 32 11 - 59 U/L  Urinalysis, Routine w reflex microscopic  Result Value Ref Range   Color, Urine YELLOW YELLOW   APPearance CLOUDY (A) CLEAR   Specific Gravity, Urine 1.015 1.005 - 1.030   pH 5.5 5.0 - 8.0   Glucose, UA NEGATIVE NEGATIVE mg/dL    Hgb urine dipstick NEGATIVE NEGATIVE   Bilirubin Urine NEGATIVE NEGATIVE   Ketones, ur NEGATIVE NEGATIVE mg/dL   Protein, ur NEGATIVE NEGATIVE mg/dL   Urobilinogen, UA 0.2 0.0 - 1.0 mg/dL   Nitrite NEGATIVE NEGATIVE   Leukocytes, UA NEGATIVE NEGATIVE  I-stat troponin, ED (not at [facility])  Result Value Ref Range   Troponin i, poc 0.02 0.00 - 0.08 ng/mL   Comment 3          I-Stat CG4 Lactic Acid, ED  Result Value Ref Range   Lactic Acid, Venous 1.47 0.5 - 2.0 mmol/L   Dg Chest Port 1 View  12/17/2014   CLINICAL DATA:  Chest pain  EXAM: PORTABLE CHEST - 1 VIEW  COMPARISON:  None.  FINDINGS: Moderate enlargement of the cardiomediastinal silhouette is identified with central pulmonary arterial prominence and tapering which may suggest pulmonary arterial hypertension. Dual lead left-sided pacer is in place. Lungs are clear. No pleural effusion. Mild  left shoulder degenerative change is identified.  IMPRESSION: Cardiomegaly without evidence for edema.  Pulmonary arterial prominence with tapering suggesting pulmonary arterial hypertension.   Electronically Signed   By: Christiana PellantGretchen  Green M.D.   On: 12/17/2014 17:11    Patient with onset of chest pain midsternal area. Also some epigastric discomfort. Patient was told to come in the chest pain lasted more than 20 minutes. She took 3 of her nitroglycerin and aspirin at home. Pain has resolved upon arrival here. Patient had a cardiac cath in 2013, and had a stent placed at that time. EKG shows an atrial paced rhythm. Without any acute cardiac changes. Troponin was negative. Patient consult did go with cardiology and will be admitted. Patient is alert and cooperative and follows commands. Heart irregular, lungs clear bilaterally, abdomen soft perhaps some mild epigastric discomfort but no guarding. As mentioned chest x-ray was negative for pneumonia or edema did show some cardiomegaly.  Patient will require admission for formal rule out.  Vanetta MuldersScott Cailie Bosshart,  MD 12/17/14 2002

## 2014-12-17 NOTE — H&P (Signed)
[facility] History and Physical  Amy FramesRosa L [name] ZOX:[id number] DOB: 07/19/1914 DOA: 12/17/2014  Referring physician: Jon GillsZach Webb, MD PCP: Lupe CarneyMITCHELL,DEAN, MD   Chief Complaint: Chest Pain  HPI: [name] is a 81 y.o. female presents with chest pain. Patient states taht she was eating her dinner and started to have pain in her lower chest around the epigastric area. Patient states that she took some NTG sublingual time three and had no relief. She in fact states that she started to vomit. The pain did not appear to go anywhere else. She states taht she had no shortness of breath with the pain. She had no loss of consciousness. She denies having any diarrhea. She states that since she had taken 3 NTG with no relief she decided to come to the hospital. She was given ASA by the EMS. She does have a history of GERD and also has a history CAD and has had stent placement with a pacer placement also. Currently she is pain free.   Review of Systems:  Constitutional:  No weight loss, night sweats, Fevers, chills, fatigue.  HEENT:  No headaches, Difficulty swallowing,Tooth/dental problems,Sore throat,  No sneezing Cardio-vascular:  ++chest pain, no Orthopnea, PND, dizziness, palpitations  GI:  No heartburn, indigestion, ++abdominal pain, ++nausea, ++vomiting, no diarrhea  Resp:  No shortness of breath with exertion or at rest. No excess mucus, no productive cough  Skin:  no rash or lesions GU:  no dysuria, change in color of urine, no urgency or frequency.  Musculoskeletal:  No joint pain or swelling. No decreased range of motion.  Psych:  No change in mood or affect. No depression or anxiety.   Past Medical History  Diagnosis Date  . Hypertension   . Coronary artery disease   . Myocardial infarction     x 3  . Angina     on nitro-dur  . Shortness of breath     with exertion  . GERD (gastroesophageal reflux disease)   . Hyperlipidemia   . Glaucoma   . History of eye  surgery     bilateral   . Anemia   . Pacemaker    Past Surgical History  Procedure Laterality Date  . Cardiac catherization  06/2010  . Cardiac catheterization    . Pacemaker insertion  06/2010  . Svd      x 2  . Appendectomy    . Thyroidectomy    . Hysteroscopy w/d&c  09/07/2011    Procedure: DILATATION AND CURETTAGE (D&C) /HYSTEROSCOPY;  Surgeon: Turner Danielsavid C Lowe, MD;  Location: [facility];  Service: Gynecology;  Laterality: N/A;  uterus  . Coronary angioplasty with stent placement  02/13/2012  . Left heart catheterization with coronary angiogram N/A 01/25/2012    Procedure: LEFT HEART CATHETERIZATION WITH CORONARY ANGIOGRAM;  Surgeon: Robynn PaneMohan N Harwani, MD;  [facility];  Service: Cardiovascular;  Laterality: N/A;  . Percutaneous coronary stent intervention (pci-s) N/A 02/13/2012    Procedure: PERCUTANEOUS CORONARY STENT INTERVENTION (PCI-S);  Surgeon: Robynn PaneMohan N Harwani, MD;  [facility];  Service: Cardiovascular;  Laterality: N/A;   Social History:  reports that she quit smoking about 23 years ago. Her smoking use included Cigarettes. She smoked 0.50 packs per day. She has never used smokeless tobacco. She reports that she does not drink alcohol or use illicit drugs.  No Known Allergies  No family history on file.   Prior to Admission medications   Medication Sig Start Date End Date Taking? Authorizing  Provider  amLODipine (NORVASC) 5 MG tablet Take 5 mg by mouth daily.    Historical Provider, MD  aspirin 81 MG EC tablet Take 81 mg by mouth every morning.     Robynn Pane, MD  bimatoprost (LUMIGAN) 0.03 % ophthalmic solution Place 1 drop into the left eye at bedtime.     Historical Provider, MD  carvedilol (COREG) 12.5 MG tablet TAKE 1 TABLET BY MOUTH TWICE DAILY WITH A MEAL 07/27/14   Mihai Croitoru, MD  clopidogrel (PLAVIX) 75 MG tablet Take 75 mg by mouth daily.    Historical Provider, MD  dorzolamide (TRUSOPT) 2 % ophthalmic solution Place 1 drop into the right eye 3  (three) times daily.     Historical Provider, MD  famotidine (PEPCID) 20 MG tablet Take 20 mg by mouth 2 (two) times daily.     Historical Provider, MD  folic acid (FOLVITE) 1 MG tablet TAKE 1 TABLET BY MOUTH EVERY DAY 08/11/14   Mihai Croitoru, MD  furosemide (LASIX) 20 MG tablet Take 20 mg by mouth daily. 05/09/14   Historical Provider, MD  nepafenac (NEVANAC) 0.1 % ophthalmic suspension Place 1 drop into the right eye at bedtime.     Historical Provider, MD  nitroGLYCERIN (NITRODUR - DOSED IN MG/24 HR) 0.2 mg/hr Place 1 patch onto the skin every morning. Apply to chest wall & remove at night    Historical Provider, MD  nitroGLYCERIN (NITROSTAT) 0.4 MG SL tablet Place 0.4 mg under the tongue every 5 (five) minutes as needed. For chest pain 02/14/12   Robynn Pane, MD  ramipril (ALTACE) 10 MG capsule Take 10 mg by mouth 2 (two) times daily.      Historical Provider, MD  rosuvastatin (CRESTOR) 5 MG tablet Take 5 mg by mouth daily.    Historical Provider, MD   Physical Exam: Filed Vitals:   12/17/14 1730 12/17/14 1800 12/17/14 1830 12/17/14 1915  BP: 121/55 124/56 120/67 157/59  Pulse: 59 60 60 61  Temp:      TempSrc:      Resp: SpO2: 99% 99% 99% 97%    Wt Readings from Last 3 Encounters:  06/09/14 58.922 kg (129 lb 14.4 oz)  04/11/13 60.782 kg (134 lb)  12/05/12 59.875 kg (132 lb)    General:  Appears calm and comfortable Eyes: PERRL, normal lids, irises & conjunctiva ENT: grossly normal hearing, lips & tongue Neck: no LAD, masses or thyromegaly Cardiovascular: RRR, no m/r/g. Trace LE edema. Respiratory: CTA bilaterally, no w/r/r. Normal respiratory effort. Abdomen: soft, ntnd Skin: no rash or induration seen on limited exam Musculoskeletal: grossly normal tone BUE/BLE Psychiatric: grossly normal mood and affect, speech fluent and appropriate Neurologic: grossly non-focal          Labs on Admission:  Basic Metabolic Panel:  Recent Labs Lab 12/17/14 1559    NA 140  K 3.8  CL 109  CO2 25  GLUCOSE 123*  BUN 13  CREATININE 0.97  CALCIUM 9.1   Liver Function Tests:  Recent Labs Lab 12/17/14 1559  AST 17  ALT 12  ALKPHOS 48  BILITOT 0.6  PROT 6.1  ALBUMIN 3.9    Recent Labs Lab 12/17/14 1559  LIPASE 32   No results for input(s): AMMONIA in the last 168 hours. CBC:  Recent Labs Lab 12/17/14 1559  WBC 5.4  HGB 12.0  HCT 36.4  MCV 90.1  PLT 200   Cardiac Enzymes: No results for input(s): CKTOTAL,  CKMB, CKMBINDEX, TROPONINI in the last 168 hours.  BNP (last 3 results) No results for input(s): BNP in the last 8760 hours.  ProBNP (last 3 results) No results for input(s): PROBNP in the last 8760 hours.  CBG: No results for input(s): GLUCAP in the last 168 hours.  Radiological Exams on Admission: Dg Chest Port 1 View  12/17/2014   CLINICAL DATA:  Chest pain  EXAM: PORTABLE CHEST - 1 VIEW  COMPARISON:  None.  FINDINGS: Moderate enlargement of the cardiomediastinal silhouette is identified with central pulmonary arterial prominence and tapering which may suggest pulmonary arterial hypertension. Dual lead left-sided pacer is in place. Lungs are clear. No pleural effusion. Mild left shoulder degenerative change is identified.  IMPRESSION: Cardiomegaly without evidence for edema.  Pulmonary arterial prominence with tapering suggesting pulmonary arterial hypertension.   Electronically Signed   By: Christiana Pellant M.D.   On: 12/17/2014 17:11      Assessment/Plan Active Problems:   CAD (coronary artery disease)   HTN (hypertension)   Hyperlipidemia   Chest pain   1. Chest Pain -atypical location and presentation -will admit for observation -will get serial enzymes -will get echo in am -cardiology aware of patient  2. HTN -pressure is controlled -will continue with home medications  3. CAD -has had stent placement in the past -will monitor and rule out as above  4. Hyperlipidemia -continue  statins     Code Status: Full Code (must indicate code status--if unknown or must be presumed, indicate so) DVT Prophylaxis:Plavix Family Communication: None (indicate person spoken with, if applicable, with phone number if by telephone) Disposition Plan: Home (indicate anticipated LOS)  Time spent:  [facility] Pager [phone]

## 2014-12-17 NOTE — ED Notes (Signed)
Per ems-- pt from home with reports sharp R sided cp that started while she was eating. Pt presents in a paced rhythm at 60 bpm. Upon arrival to ER pt has productive cough- sts this started after taking asa. Pt took 3 nitro pta and 324 asa. Pt skin warm and dry.

## 2014-12-17 NOTE — ED Provider Notes (Signed)
CSN: [id number]     Arrival date & time 12/17/14  1551 History   None    Chief Complaint  Patient presents with  . Chest Pain   (Consider location/radiation/quality/duration/timing/severity/associated sxs/prior Treatment) HPI Comments: 81 yo F hx of CAD s/p PCI, HTN, HLD, symptomatic bradycardia s/p pacemaker placement, presents with CC of chest pain.  Pt states symptoms started around 2 PM.  Pt states she took a bite of some carrots, when she had sudden onset chest pain/epigastric pain.  Pt states she took three nitro SL, without relief, and started to vomit.  EMS called and gave ASA, and pt developed mild cough after receiving aspirin. Pt denies fever, chills, SOB, diarrhea, constipation, dysuria, rash, myalgias, or any other symptoms.  Pt has been taking home medications.  Denies any other recent illnesses.  Pt had remote pacemaker interrogation yesterday, which was WNL.  No other concerns.    The history is provided by the patient. No language interpreter was used.    Past Medical History  Diagnosis Date  . Hypertension   . Coronary artery disease   . Myocardial infarction     x 3  . Angina     on nitro-dur  . Shortness of breath     with exertion  . GERD (gastroesophageal reflux disease)   . Hyperlipidemia   . Glaucoma   . History of eye surgery     bilateral   . Anemia   . Pacemaker    Past Surgical History  Procedure Laterality Date  . Cardiac catherization  06/2010  . Cardiac catheterization    . Pacemaker insertion  06/2010  . Svd      x 2  . Appendectomy    . Thyroidectomy    . Hysteroscopy w/d&c  09/07/2011    Procedure: DILATATION AND CURETTAGE (D&C) /HYSTEROSCOPY;  Surgeon: Turner Danielsavid C Lowe, MD;  Location: [facility];  Service: Gynecology;  Laterality: N/A;  uterus  . Coronary angioplasty with stent placement  02/13/2012  . Left heart catheterization with coronary angiogram N/A 01/25/2012    Procedure: LEFT HEART CATHETERIZATION WITH CORONARY ANGIOGRAM;  Surgeon: Robynn PaneMohan N  Harwani, MD;  [facility];  Service: Cardiovascular;  Laterality: N/A;  . Percutaneous coronary stent intervention (pci-s) N/A 02/13/2012    Procedure: PERCUTANEOUS CORONARY STENT INTERVENTION (PCI-S);  Surgeon: Robynn PaneMohan N Harwani, MD;  [facility];  Service: Cardiovascular;  Laterality: N/A;   No family history on file. History  Substance Use Topics  . Smoking status: Former Smoker -- 0.50 packs/day    Types: Cigarettes    Quit date: 09/05/1991  . Smokeless tobacco: Never Used  . Alcohol Use: No     Comment: none since 1992   OB History    No data available     Review of Systems  Constitutional: Negative for fever, chills, diaphoresis and fatigue.  Respiratory: Positive for cough. Negative for shortness of breath.   Cardiovascular: Negative for chest pain.  Gastrointestinal: Positive for nausea and vomiting. Negative for abdominal pain and diarrhea.  Genitourinary: Negative for dysuria.  Musculoskeletal: Negative for myalgias.  Skin: Negative for rash.  Neurological: Negative for dizziness, weakness, light-headedness, numbness and headaches.  Hematological: Negative for adenopathy. Does not bruise/bleed easily.  All other systems reviewed and are negative.  Allergies  Review of patient's allergies indicates no known allergies.  Home Medications   Prior to Admission medications   Medication Sig Start Date End Date Taking? Authorizing Provider  amLODipine (NORVASC) 5 MG  tablet Take 5 mg by mouth daily.    Historical Provider, MD  aspirin 81 MG EC tablet Take 81 mg by mouth every morning.     Robynn Pane, MD  bimatoprost (LUMIGAN) 0.03 % ophthalmic solution Place 1 drop into the left eye at bedtime.     Historical Provider, MD  carvedilol (COREG) 12.5 MG tablet TAKE 1 TABLET BY MOUTH TWICE DAILY WITH A MEAL 07/27/14   Mihai Croitoru, MD  clopidogrel (PLAVIX) 75 MG tablet Take 75 mg by mouth daily.    Historical Provider, MD  dorzolamide (TRUSOPT) 2 %  ophthalmic solution Place 1 drop into the right eye 3 (three) times daily.     Historical Provider, MD  famotidine (PEPCID) 20 MG tablet Take 20 mg by mouth 2 (two) times daily.     Historical Provider, MD  folic acid (FOLVITE) 1 MG tablet TAKE 1 TABLET BY MOUTH EVERY DAY 08/11/14   Mihai Croitoru, MD  furosemide (LASIX) 20 MG tablet Take 20 mg by mouth daily. 05/09/14   Historical Provider, MD  nepafenac (NEVANAC) 0.1 % ophthalmic suspension Place 1 drop into the right eye at bedtime.     Historical Provider, MD  nitroGLYCERIN (NITRODUR - DOSED IN MG/24 HR) 0.2 mg/hr Place 1 patch onto the skin every morning. Apply to chest wall & remove at night    Historical Provider, MD  nitroGLYCERIN (NITROSTAT) 0.4 MG SL tablet Place 0.4 mg under the tongue every 5 (five) minutes as needed. For chest pain 02/14/12   Robynn Pane, MD  ramipril (ALTACE) 10 MG capsule Take 10 mg by mouth 2 (two) times daily.      Historical Provider, MD  rosuvastatin (CRESTOR) 5 MG tablet Take 5 mg by mouth daily.    Historical Provider, MD   BP 148/60 mmHg  Pulse 63  Temp(Src) 97.6 F (36.4 C) (Oral)  Resp 16  SpO2 100% Physical Exam  Constitutional: She is oriented to person, place, and time. She appears well-developed and well-nourished.  HENT:  Head: Normocephalic and atraumatic.  Right Ear: External ear normal.  Left Ear: External ear normal.  Mouth/Throat: Oropharynx is clear and moist.  Eyes: Conjunctivae and EOM are normal. Pupils are equal, round, and reactive to light.  Neck: Normal range of motion. Neck supple.  Cardiovascular: Normal rate, regular rhythm, normal heart sounds and intact distal pulses.   Pulmonary/Chest: Effort normal and breath sounds normal. No respiratory distress. She has no wheezes. She has no rales. She exhibits tenderness.  Mild central chest wall TTP.   Abdominal: Soft. Bowel sounds are normal. She exhibits no distension and no mass. There is tenderness. There is no rebound and no  guarding.  Mild epigastric TTP.  Soft, no rebound, no guarding.   Musculoskeletal: Normal range of motion.  Neurological: She is alert and oriented to person, place, and time.  Skin: Skin is warm and dry.  Nursing note and vitals reviewed.   ED Course  Procedures (including critical care time) Labs Review Labs Reviewed  BASIC METABOLIC PANEL - Abnormal; Notable for the following:    Glucose, Bld 123 (*)    GFR calc non Af Amer 47 (*)    GFR calc Af Amer 55 (*)    All other components within normal limits  URINALYSIS, ROUTINE W REFLEX MICROSCOPIC - Abnormal; Notable for the following:    APPearance CLOUDY (*)    All other components within normal limits  TROPONIN I - Abnormal; Notable for the following:  Troponin I 0.04 (*)    All other components within normal limits  CBC  HEPATIC FUNCTION PANEL  LIPASE, BLOOD  TROPONIN I  TROPONIN I  I-STAT TROPOININ, ED  I-STAT CG4 LACTIC ACID, ED    Imaging Review Dg Chest Port 1 View  12/17/2014   CLINICAL DATA:  Chest pain  EXAM: PORTABLE CHEST - 1 VIEW  COMPARISON:  None.  FINDINGS: Moderate enlargement of the cardiomediastinal silhouette is identified with central pulmonary arterial prominence and tapering which may suggest pulmonary arterial hypertension. Dual lead left-sided pacer is in place. Lungs are clear. No pleural effusion. Mild left shoulder degenerative change is identified.  IMPRESSION: Cardiomegaly without evidence for edema.  Pulmonary arterial prominence with tapering suggesting pulmonary arterial hypertension.   Electronically Signed   By: Christiana Pellant M.D.   On: 12/17/2014 17:11     EKG Interpretation   Date/Time:  Thursday December 17 2014 15:44:42 EST Ventricular Rate:  60 PR Interval:    QRS Duration: 96 QT Interval:  468 QTC Calculation: 468 R Axis:   14 Text Interpretation:  Atrial-paced rhythm Septal infarct , age  undetermined T wave abnormality, consider inferolateral ischemia Abnormal  ECG No  previous ECGs available Confirmed by ZACKOWSKI  MD, SCOTT [phone])  on 12/17/2014 4:37:47 PM      MDM   Final diagnoses:  Chest pain, unspecified chest pain type   81 yo F hx of CAD s/p PCI, HTN, HLD, symptomatic bradycardia s/p pacemaker placement, presents with CC of chest pain.  DDx ACS,   Physical exam as above.  VS WNL.  EKG similar to priors, no acute changes.  iStat troponin 0.02.  CBC, BMP, LFT, Lipase, LA all WNL.  UA negative for infection.  Pt given fentanyl, s/p ASA and nitro.  She continues to c/o mild chest pain, but improved from home. Pt without back pain, has stable vitals, no distress, unlikely dissection.  No PE risk factors.    Cardiology consulted, and recommends admission to medicine for CP r/o, will follow pt if troponin positive.  Pt and family understand and agree with plan.   Jon Gills  Discussed pt with my attending Dr. Deretha Emory.     Jon Gills, MD 12/18/14 [phone]

## 2014-12-18 ENCOUNTER — Encounter (HOSPITAL_COMMUNITY)
Admission: EM | Disposition: A | Discharge status: Home / Self Care | Payer: Self-pay | Source: Home / Self Care | Attending: Emergency Medicine

## 2014-12-18 DIAGNOSIS — I25118 Atherosclerotic heart disease of native coronary artery with other forms of angina pectoris: Secondary | ICD-10-CM

## 2014-12-18 HISTORY — PX: LEFT HEART CATHETERIZATION WITH CORONARY ANGIOGRAM: SHX5451

## 2014-12-18 LAB — TROPONIN I
Troponin I: 0.04 ng/mL — ABNORMAL HIGH (ref <0.031)
Troponin I: 0.09 ng/mL — ABNORMAL HIGH (ref <0.031)

## 2014-12-18 SURGERY — LEFT HEART CATHETERIZATION WITH CORONARY ANGIOGRAM
Anesthesia: LOCAL

## 2014-12-18 MED ORDER — ACETAMINOPHEN 325 MG PO TABS: 650.0000 mg | ORAL_TABLET | ORAL | Status: DC | PRN

## 2014-12-18 MED ORDER — HEPARIN (PORCINE) IN NACL 2-0.9 UNIT/ML-% IJ SOLN
INTRAMUSCULAR | Status: AC
  Filled 2014-12-18: qty 1500

## 2014-12-18 MED ORDER — ONDANSETRON HCL 4 MG/2ML IJ SOLN: 4.0000 mg | Freq: Four times a day (QID) | INTRAMUSCULAR | Status: DC | PRN

## 2014-12-18 MED ORDER — SODIUM CHLORIDE 0.9 % IJ SOLN: 3.0000 mL | Freq: Two times a day (BID) | INTRAMUSCULAR | Status: DC

## 2014-12-18 MED ORDER — CLOPIDOGREL BISULFATE 75 MG PO TABS
300.0000 mg | ORAL_TABLET | ORAL | Status: DC
Start: 2014-12-19 — End: 2014-12-18

## 2014-12-18 MED ORDER — SODIUM CHLORIDE 0.9 % IV SOLN: INTRAVENOUS | Status: AC

## 2014-12-18 MED ORDER — SODIUM CHLORIDE 0.9 % IJ SOLN: 3.0000 mL | INTRAMUSCULAR | Status: DC | PRN

## 2014-12-18 MED ORDER — SODIUM CHLORIDE 0.9 % IV SOLN: 250.0000 mL | INTRAVENOUS | Status: DC | PRN

## 2014-12-18 MED ORDER — LIDOCAINE HCL (PF) 1 % IJ SOLN
INTRAMUSCULAR | Status: AC
  Filled 2014-12-18: qty 30

## 2014-12-18 MED ORDER — SODIUM CHLORIDE 0.9 % IV SOLN: 1.0000 mL/kg/h | INTRAVENOUS | Status: DC

## 2014-12-18 MED ORDER — ASPIRIN 81 MG PO CHEW: 81.0000 mg | CHEWABLE_TABLET | ORAL | Status: DC

## 2014-12-18 MED ORDER — NITROGLYCERIN 1 MG/10 ML FOR IR/CATH LAB
INTRA_ARTERIAL | Status: DC
Start: 2014-12-18 — End: 2014-12-18
  Filled 2014-12-18: qty 10

## 2014-12-18 NOTE — Progress Notes (Signed)
PROGRESS NOTE  Amy FramesRosa L [name] ZOX:[id number] DOB: 07/19/1914 DOA: 12/17/2014 PCP: Lupe CarneyMITCHELL,DEAN, MD  HPI/Recap of past 24 hours: Reported chest pain last night, currently pain free, laying in bed in NAD. Grandson at bedside  Assessment/Plan: Active Problems:   CAD (coronary artery disease)   HTN (hypertension)   Hyperlipidemia   Chest pain 1. Chest Pain -when cooking, with diaphoresis, did not resolve with nitroglycerinx3. -admit for observation -mild elevated troponin, cardiology consulted (Dr. Sharyn LullHarwani), recommended cardiac cath 2/19 pm -echo LVEF WNL, no WMA.  2. HTN -pressure is controlled -will continue with home medications  3. CAD -has had stent placement in the past -will monitor and rule out as above  4. Hyperlipidemia -continue statins   Code Status: full  Family Communication: patient and grandson  Disposition Plan: home when stable ( she lives with her sister, perform ADL independently)   Consultants:  Cardiology Dr Sharyn LullHarwani  Procedures:  Cardiac cath 2/19  Antibiotics:  none   Objective: BP 103/45 mmHg  Pulse 60  Temp(Src) 98.4 F (36.9 C) (Oral)  Resp 14  Ht 5' (1.524 m)  Wt 62.596 kg (138 lb)  BMI 26.95 kg/m2  SpO2 98%  Intake/Output Summary (Last 24 hours) at 12/18/14 1545 Last data filed at 12/18/14 1030  Gross per 24 hour  Intake    330 ml  Output    252 ml  Net     78 ml   Filed Weights   12/17/14 2035 12/18/14 0434  Weight: 62.914 kg (138 lb 11.2 oz) 62.596 kg (138 lb)    Exam: 2. General:  calm and comfortable, very pleasant, able provide detailed history 3. Eyes: PERRL, normal lids, irises & conjunctiva 4. ENT: grossly normal hearing, lips & tongue 5. Neck: no LAD, masses or thyromegaly 6. Cardiovascular: RRR, no m/r/g. Trace LE edema. 7. Respiratory: CTA bilaterally, no w/r/r. Normal respiratory effort. 8. Abdomen: soft, ntnd 9. Skin: no rash or induration seen on limited exam 10. Musculoskeletal:  grossly normal tone BUE/BLE 11. Psychiatric: grossly normal mood and affect, speech fluent and appropriate 12. Neurologic: grossly non-focal   Data Reviewed: Basic Metabolic Panel:  Recent Labs Lab 12/17/14 1559  NA 140  K 3.8  CL 109  CO2 25  GLUCOSE 123*  BUN 13  CREATININE 0.97  CALCIUM 9.1   Liver Function Tests:  Recent Labs Lab 12/17/14 1559  AST 17  ALT 12  ALKPHOS 48  BILITOT 0.6  PROT 6.1  ALBUMIN 3.9    Recent Labs Lab 12/17/14 1559  LIPASE 32   No results for input(s): AMMONIA in the last 168 hours. CBC:  Recent Labs Lab 12/17/14 1559  WBC 5.4  HGB 12.0  HCT 36.4  MCV 90.1  PLT 200   Cardiac Enzymes:    Recent Labs Lab 12/17/14 2231 12/18/14 0110 12/18/14 0455  TROPONINI 0.04* 0.04* 0.09*   BNP (last 3 results) No results for input(s): BNP in the last 8760 hours.  ProBNP (last 3 results) No results for input(s): PROBNP in the last 8760 hours.  CBG: No results for input(s): GLUCAP in the last 168 hours.  No results found for this or any previous visit (from the past 240 hour(s)).   Studies: Dg Chest Port 1 View  12/17/2014   CLINICAL DATA:  Chest pain  EXAM: PORTABLE CHEST - 1 VIEW  COMPARISON:  None.  FINDINGS: Moderate enlargement of the cardiomediastinal silhouette is identified with central pulmonary arterial prominence and tapering which may suggest pulmonary arterial hypertension.  Dual lead left-sided pacer is in place. Lungs are clear. No pleural effusion. Mild left shoulder degenerative change is identified.  IMPRESSION: Cardiomegaly without evidence for edema.  Pulmonary arterial prominence with tapering suggesting pulmonary arterial hypertension.   Electronically Signed   By: Christiana Pellant M.D.   On: 12/17/2014 17:11    Scheduled Meds: . amLODipine  5 mg Oral Daily  . aspirin EC  325 mg Oral Daily  . carvedilol  12.5 mg Oral BID WC  . clopidogrel  75 mg Oral Daily  . dorzolamide  1 drop Right Eye TID  . famotidine   20 mg Oral BID  . furosemide  20 mg Oral Daily  . latanoprost  1 drop Left Eye QHS  . nepafenac  1 drop Right Eye QHS  . nitroGLYCERIN  0.2 mg Transdermal q morning - 10a  . ramipril  10 mg Oral BID  . rosuvastatin  5 mg Oral Daily  . sodium chloride  3 mL Intravenous Q12H    Continuous Infusions: . sodium chloride         Levoy Geisen  [facility] Pager [phone]. If 7PM-7AM, please contact night-coverage at [email or web address], password [facility] 12/18/2014, 3:45 PM

## 2014-12-18 NOTE — CV Procedure (Signed)
Left cardiac catheterization report dictated on 12/18/2014 dictation and limited [phone]

## 2014-12-18 NOTE — Progress Notes (Signed)
UR completed 

## 2014-12-18 NOTE — Consult Note (Signed)
Reason for Consult: Chest pain minimally elevated troponin I Referring Physician: [facility]  [name] is an 81 y.o. female.  HPI: Patient is 81 year old female with past medical history significant for coronary artery disease history of anteroseptal wall myocardial infarction in the past status post PTCA stenting to LAD in September 2011 and subsequently had PTCA stenting to left circumflex in April 2013, hypertension, hypercholesterolemia, sick sinus syndrome status post permanent pacemaker, history of remote GI bleed in the past the was admitted yesterday because of vague retrosternal chest pain associated with nausea vomiting 2 total 3 sublingual nitroglycerin without much relief her to call EMS and was admitted EKG showed no new acute ischemic changes her her first set of troponin I was minimally elevated which is trending up slightly. Patient presently denies any chest pains. Denies any shortness of breath. Denies palpitation lightheadedness or syncope.  Past Medical History  Diagnosis Date  . Hypertension   . Coronary artery disease   . Myocardial infarction     x 3  . Angina     on nitro-dur  . Shortness of breath     with exertion  . GERD (gastroesophageal reflux disease)   . Hyperlipidemia   . Glaucoma   . History of eye surgery     bilateral   . Anemia   . Pacemaker     Past Surgical History  Procedure Laterality Date  . Cardiac catherization  06/2010  . Cardiac catheterization    . Pacemaker insertion  06/2010  . Svd      x 2  . Appendectomy    . Thyroidectomy    . Hysteroscopy w/d&c  09/07/2011    Procedure: DILATATION AND CURETTAGE (D&C) /HYSTEROSCOPY;  Surgeon: Luz Lex, MD;  Location: [facility];  Service: Gynecology;  Laterality: N/A;  uterus  . Coronary angioplasty with stent placement  02/13/2012  . Left heart catheterization with coronary angiogram N/A 01/25/2012    Procedure: LEFT HEART CATHETERIZATION WITH CORONARY ANGIOGRAM;  Surgeon: Clent Demark, MD;  Location: [facility] CATH LAB;  Service: Cardiovascular;  Laterality: N/A;  . Percutaneous coronary stent intervention (pci-s) N/A 02/13/2012    Procedure: PERCUTANEOUS CORONARY STENT INTERVENTION (PCI-S);  Surgeon: Clent Demark, MD;  Location: [facility] CATH LAB;  Service: Cardiovascular;  Laterality: N/A;    No family history on file.  Social History:  reports that she quit smoking about 23 years ago. Her smoking use included Cigarettes. She smoked 0.50 packs per day. She has never used smokeless tobacco. She reports that she does not drink alcohol or use illicit drugs.  Allergies: No Known Allergies  Medications: I have reviewed the patient's current medications.  Results for orders placed or performed during the hospital encounter of 12/17/14 (from the past 48 hour(s))  CBC     Status: None   Collection Time: 12/17/14  3:59 PM  Result Value Ref Range   WBC 5.4 4.0 - 10.5 K/uL   RBC 4.04 3.87 - 5.11 MIL/uL   Hemoglobin 12.0 12.0 - 15.0 g/dL   HCT 36.4 36.0 - 46.0 %   MCV 90.1 78.0 - 100.0 fL   MCH 29.7 26.0 - 34.0 pg   MCHC 33.0 30.0 - 36.0 g/dL   RDW 14.1 11.5 - 15.5 %   Platelets 200 150 - 400 K/uL  Basic metabolic panel     Status: Abnormal   Collection Time: 12/17/14  3:59 PM  Result Value Ref Range   Sodium 140 135 - 145  mmol/L   Potassium 3.8 3.5 - 5.1 mmol/L   Chloride 109 96 - 112 mmol/L   CO2 25 19 - 32 mmol/L   Glucose, Bld 123 (H) 70 - 99 mg/dL   BUN 13 6 - 23 mg/dL   Creatinine, Ser 0.97 0.50 - 1.10 mg/dL   Calcium 9.1 8.4 - 10.5 mg/dL   GFR calc non Af Amer 47 (L) >90 mL/min   GFR calc Af Amer 55 (L) >90 mL/min    Comment: (NOTE) The eGFR has been calculated using the CKD EPI equation. This calculation has not been validated in all clinical situations. eGFR's persistently <90 mL/min signify possible Chronic Kidney Disease.    Anion gap 6 5 - 15  Hepatic function panel     Status: None   Collection Time: 12/17/14  3:59 PM  Result Value Ref Range    Total Protein 6.1 6.0 - 8.3 g/dL   Albumin 3.9 3.5 - 5.2 g/dL   AST 17 0 - 37 U/L   ALT 12 0 - 35 U/L   Alkaline Phosphatase 48 39 - 117 U/L   Total Bilirubin 0.6 0.3 - 1.2 mg/dL   Bilirubin, Direct 0.1 0.0 - 0.5 mg/dL   Indirect Bilirubin 0.5 0.3 - 0.9 mg/dL  Lipase, blood     Status: None   Collection Time: 12/17/14  3:59 PM  Result Value Ref Range   Lipase 32 11 - 59 U/L  I-Stat CG4 Lactic Acid, ED     Status: None   Collection Time: 12/17/14  5:08 PM  Result Value Ref Range   Lactic Acid, Venous 1.47 0.5 - 2.0 mmol/L  I-stat troponin, ED (not at [facility])     Status: None   Collection Time: 12/17/14  5:21 PM  Result Value Ref Range   Troponin i, poc 0.02 0.00 - 0.08 ng/mL   Comment 3            Comment: Due to the release kinetics of cTnI, a negative result within the first hours of the onset of symptoms does not rule out myocardial infarction with certainty. If myocardial infarction is still suspected, repeat the test at appropriate intervals.   Urinalysis, Routine w reflex microscopic     Status: Abnormal   Collection Time: 12/17/14  5:58 PM  Result Value Ref Range   Color, Urine YELLOW YELLOW   APPearance CLOUDY (A) CLEAR   Specific Gravity, Urine 1.015 1.005 - 1.030   pH 5.5 5.0 - 8.0   Glucose, UA NEGATIVE NEGATIVE mg/dL   Hgb urine dipstick NEGATIVE NEGATIVE   Bilirubin Urine NEGATIVE NEGATIVE   Ketones, ur NEGATIVE NEGATIVE mg/dL   Protein, ur NEGATIVE NEGATIVE mg/dL   Urobilinogen, UA 0.2 0.0 - 1.0 mg/dL   Nitrite NEGATIVE NEGATIVE   Leukocytes, UA NEGATIVE NEGATIVE    Comment: MICROSCOPIC NOT DONE ON URINES WITH NEGATIVE PROTEIN, BLOOD, LEUKOCYTES, NITRITE, OR GLUCOSE <1000 mg/dL.  Troponin I-serum (0, 3, 6 hours)     Status: Abnormal   Collection Time: 12/17/14 10:31 PM  Result Value Ref Range   Troponin I 0.04 (H) <0.031 ng/mL    Comment:        PERSISTENTLY INCREASED TROPONIN VALUES IN THE RANGE OF 0.04-0.49 ng/mL CAN BE SEEN IN:       -UNSTABLE ANGINA        -CONGESTIVE HEART FAILURE       -MYOCARDITIS       -CHEST TRAUMA       -ARRYHTHMIAS       -  LATE PRESENTING MYOCARDIAL INFARCTION       -COPD   CLINICAL FOLLOW-UP RECOMMENDED.   Troponin I-serum (0, 3, 6 hours)     Status: Abnormal   Collection Time: 12/18/14  1:10 AM  Result Value Ref Range   Troponin I 0.04 (H) <0.031 ng/mL    Comment:        PERSISTENTLY INCREASED TROPONIN VALUES IN THE RANGE OF 0.04-0.49 ng/mL CAN BE SEEN IN:       -UNSTABLE ANGINA       -CONGESTIVE HEART FAILURE       -MYOCARDITIS       -CHEST TRAUMA       -ARRYHTHMIAS       -LATE PRESENTING MYOCARDIAL INFARCTION       -COPD   CLINICAL FOLLOW-UP RECOMMENDED.   Troponin I-serum (0, 3, 6 hours)     Status: Abnormal   Collection Time: 12/18/14  4:55 AM  Result Value Ref Range   Troponin I 0.09 (H) <0.031 ng/mL    Comment:        PERSISTENTLY INCREASED TROPONIN VALUES IN THE RANGE OF 0.04-0.49 ng/mL CAN BE SEEN IN:       -UNSTABLE ANGINA       -CONGESTIVE HEART FAILURE       -MYOCARDITIS       -CHEST TRAUMA       -ARRYHTHMIAS       -LATE PRESENTING MYOCARDIAL INFARCTION       -COPD   CLINICAL FOLLOW-UP RECOMMENDED.     Dg Chest Port 1 View  12/17/2014   CLINICAL DATA:  Chest pain  EXAM: PORTABLE CHEST - 1 VIEW  COMPARISON:  None.  FINDINGS: Moderate enlargement of the cardiomediastinal silhouette is identified with central pulmonary arterial prominence and tapering which may suggest pulmonary arterial hypertension. Dual lead left-sided pacer is in place. Lungs are clear. No pleural effusion. Mild left shoulder degenerative change is identified.  IMPRESSION: Cardiomegaly without evidence for edema.  Pulmonary arterial prominence with tapering suggesting pulmonary arterial hypertension.   Electronically Signed   By: Conchita Paris M.D.   On: 12/17/2014 17:11    Review of Systems  Constitutional: Negative for fever and chills.  Eyes: Negative for blurred vision and double vision.  Cardiovascular:  Positive for chest pain. Negative for palpitations, orthopnea and claudication.  Gastrointestinal: Positive for nausea and vomiting.  Genitourinary: Negative for dysuria.  Neurological: Negative for dizziness and headaches.   Blood pressure 103/45, pulse 60, temperature 98.4 F (36.9 C), temperature source Oral, resp. rate 14, height 5' (1.524 m), weight 62.596 kg (138 lb), SpO2 98 %. Physical Exam  Constitutional: She is oriented to person, place, and time.  HENT:  Head: Normocephalic and atraumatic.  Eyes: Conjunctivae are normal. Pupils are equal, round, and reactive to light. Left eye exhibits no discharge. No scleral icterus.  Neck: Normal range of motion. Neck supple. No thyromegaly present.  Cardiovascular: Normal rate and regular rhythm.   Murmur (Soft systolic murmur noted) heard. Respiratory: Effort normal and breath sounds normal. No respiratory distress. She has no wheezes. She has no rales.  GI: Soft. Bowel sounds are normal.  Musculoskeletal: She exhibits no edema or tenderness.  Neurological: She is alert and oriented to person, place, and time.    Assessment/Plan: Acute coronary syndrome Coronary artery disease history of MI in the past status post PCI to LAD and left circumflex in the past as above X line Hypertension Hypercholesteremia Sick sinus syndrome status post permanent pacemaker History of  remote GI bleed in the past Degenerative joint disease Plan Discussed with patient and her daughter at length regarding various options of treatment i.e. medical versus left cath possible PTCA stenting its risk and benefits i.e. death MI stroke need for emergency CABG local vascular complications etc. and consented for PCI [facility] N 12/18/2014, 2:47 PM

## 2014-12-18 NOTE — Progress Notes (Signed)
Site area: rt groin Site Prior to Removal:  Level  0 Pressure Applied For: 20 minutes Manual:   yes Patient Status During Pull:  stable Post Pull Site:  Level 0 Post Pull Instructions Given:  yes Post Pull Pulses Present: yes Dressing Applied:  tegaderm Bedrest begins @ 1655 Comments: 0 complications

## 2014-12-18 NOTE — Progress Notes (Signed)
The patient was admitted to the chest pain unit. I am the chest pain unit cardiology rounder today, and the patient was put on my list for evaluation. Her pacemaker is followed by Dr.Croitoru. She does have coronary artery disease. Her primary cardiologist is Dr. Sharyn LullHarwani. EKGs are chronically abnormal. She does have slight troponin rise. Considering the troponin rise, I feel it is most appropriate for her to be seen by Dr. Sharyn LullHarwani who knows the patient well. I have notified the nursing staff and asked them to be in touch with the primary care team and Dr. Sharyn LullHarwani.  Jerral BonitoJeff Anelise Staron, MD

## 2014-12-18 NOTE — H&P (View-Only) (Signed)
Reason for Consult: Chest pain minimally elevated troponin I Referring Physician: [facility] hospitalist  [name] is an 81 y.o. female.  HPI: Patient is 81 year old female with past medical history significant for coronary artery disease history of anteroseptal wall myocardial infarction in the past status post PTCA stenting to LAD in September 2011 and subsequently had PTCA stenting to left circumflex in April 2013, hypertension, hypercholesterolemia, sick sinus syndrome status post permanent pacemaker, history of remote GI bleed in the past the was admitted yesterday because of vague retrosternal chest pain associated with nausea vomiting 2 total 3 sublingual nitroglycerin without much relief her to call EMS and was admitted EKG showed no new acute ischemic changes her her first set of troponin I was minimally elevated which is trending up slightly. Patient presently denies any chest pains. Denies any shortness of breath. Denies palpitation lightheadedness or syncope.  Past Medical History  Diagnosis Date  . Hypertension   . Coronary artery disease   . Myocardial infarction     x 3  . Angina     on nitro-dur  . Shortness of breath     with exertion  . GERD (gastroesophageal reflux disease)   . Hyperlipidemia   . Glaucoma   . History of eye surgery     bilateral   . Anemia   . Pacemaker     Past Surgical History  Procedure Laterality Date  . Cardiac catherization  06/2010  . Cardiac catheterization    . Pacemaker insertion  06/2010  . Svd      x 2  . Appendectomy    . Thyroidectomy    . Hysteroscopy w/d&c  09/07/2011    Procedure: DILATATION AND CURETTAGE (D&C) /HYSTEROSCOPY;  Surgeon: Luz Lex, MD;  Location: [facility];  Service: Gynecology;  Laterality: N/A;  uterus  . Coronary angioplasty with stent placement  02/13/2012  . Left heart catheterization with coronary angiogram N/A 01/25/2012    Procedure: LEFT HEART CATHETERIZATION WITH CORONARY ANGIOGRAM;  Surgeon: Clent Demark, MD;  Location: [facility] CATH LAB;  Service: Cardiovascular;  Laterality: N/A;  . Percutaneous coronary stent intervention (pci-s) N/A 02/13/2012    Procedure: PERCUTANEOUS CORONARY STENT INTERVENTION (PCI-S);  Surgeon: Clent Demark, MD;  Location: [facility] CATH LAB;  Service: Cardiovascular;  Laterality: N/A;    No family history on file.  Social History:  reports that she quit smoking about 23 years ago. Her smoking use included Cigarettes. She smoked 0.50 packs per day. She has never used smokeless tobacco. She reports that she does not drink alcohol or use illicit drugs.  Allergies: No Known Allergies  Medications: I have reviewed the patient's current medications.  Results for orders placed or performed during the hospital encounter of 12/17/14 (from the past 48 hour(s))  CBC     Status: None   Collection Time: 12/17/14  3:59 PM  Result Value Ref Range   WBC 5.4 4.0 - 10.5 K/uL   RBC 4.04 3.87 - 5.11 MIL/uL   Hemoglobin 12.0 12.0 - 15.0 g/dL   HCT 36.4 36.0 - 46.0 %   MCV 90.1 78.0 - 100.0 fL   MCH 29.7 26.0 - 34.0 pg   MCHC 33.0 30.0 - 36.0 g/dL   RDW 14.1 11.5 - 15.5 %   Platelets 200 150 - 400 K/uL  Basic metabolic panel     Status: Abnormal   Collection Time: 12/17/14  3:59 PM  Result Value Ref Range   Sodium 140 135 - 145  mmol/L   Potassium 3.8 3.5 - 5.1 mmol/L   Chloride 109 96 - 112 mmol/L   CO2 25 19 - 32 mmol/L   Glucose, Bld 123 (H) 70 - 99 mg/dL   BUN 13 6 - 23 mg/dL   Creatinine, Ser 0.97 0.50 - 1.10 mg/dL   Calcium 9.1 8.4 - 10.5 mg/dL   GFR calc non Af Amer 47 (L) >90 mL/min   GFR calc Af Amer 55 (L) >90 mL/min    Comment: (NOTE) The eGFR has been calculated using the CKD EPI equation. This calculation has not been validated in all clinical situations. eGFR's persistently <90 mL/min signify possible Chronic Kidney Disease.    Anion gap 6 5 - 15  Hepatic function panel     Status: None   Collection Time: 12/17/14  3:59 PM  Result Value Ref Range    Total Protein 6.1 6.0 - 8.3 g/dL   Albumin 3.9 3.5 - 5.2 g/dL   AST 17 0 - 37 U/L   ALT 12 0 - 35 U/L   Alkaline Phosphatase 48 39 - 117 U/L   Total Bilirubin 0.6 0.3 - 1.2 mg/dL   Bilirubin, Direct 0.1 0.0 - 0.5 mg/dL   Indirect Bilirubin 0.5 0.3 - 0.9 mg/dL  Lipase, blood     Status: None   Collection Time: 12/17/14  3:59 PM  Result Value Ref Range   Lipase 32 11 - 59 U/L  I-Stat CG4 Lactic Acid, ED     Status: None   Collection Time: 12/17/14  5:08 PM  Result Value Ref Range   Lactic Acid, Venous 1.47 0.5 - 2.0 mmol/L  I-stat troponin, ED (not at [facility])     Status: None   Collection Time: 12/17/14  5:21 PM  Result Value Ref Range   Troponin i, poc 0.02 0.00 - 0.08 ng/mL   Comment 3            Comment: Due to the release kinetics of cTnI, a negative result within the first hours of the onset of symptoms does not rule out myocardial infarction with certainty. If myocardial infarction is still suspected, repeat the test at appropriate intervals.   Urinalysis, Routine w reflex microscopic     Status: Abnormal   Collection Time: 12/17/14  5:58 PM  Result Value Ref Range   Color, Urine YELLOW YELLOW   APPearance CLOUDY (A) CLEAR   Specific Gravity, Urine 1.015 1.005 - 1.030   pH 5.5 5.0 - 8.0   Glucose, UA NEGATIVE NEGATIVE mg/dL   Hgb urine dipstick NEGATIVE NEGATIVE   Bilirubin Urine NEGATIVE NEGATIVE   Ketones, ur NEGATIVE NEGATIVE mg/dL   Protein, ur NEGATIVE NEGATIVE mg/dL   Urobilinogen, UA 0.2 0.0 - 1.0 mg/dL   Nitrite NEGATIVE NEGATIVE   Leukocytes, UA NEGATIVE NEGATIVE    Comment: MICROSCOPIC NOT DONE ON URINES WITH NEGATIVE PROTEIN, BLOOD, LEUKOCYTES, NITRITE, OR GLUCOSE <1000 mg/dL.  Troponin I-serum (0, 3, 6 hours)     Status: Abnormal   Collection Time: 12/17/14 10:31 PM  Result Value Ref Range   Troponin I 0.04 (H) <0.031 ng/mL    Comment:        PERSISTENTLY INCREASED TROPONIN VALUES IN THE RANGE OF 0.04-0.49 ng/mL CAN BE SEEN IN:       -UNSTABLE ANGINA        -CONGESTIVE HEART FAILURE       -MYOCARDITIS       -CHEST TRAUMA       -ARRYHTHMIAS       -  LATE PRESENTING MYOCARDIAL INFARCTION       -COPD   CLINICAL FOLLOW-UP RECOMMENDED.   Troponin I-serum (0, 3, 6 hours)     Status: Abnormal   Collection Time: 12/18/14  1:10 AM  Result Value Ref Range   Troponin I 0.04 (H) <0.031 ng/mL    Comment:        PERSISTENTLY INCREASED TROPONIN VALUES IN THE RANGE OF 0.04-0.49 ng/mL CAN BE SEEN IN:       -UNSTABLE ANGINA       -CONGESTIVE HEART FAILURE       -MYOCARDITIS       -CHEST TRAUMA       -ARRYHTHMIAS       -LATE PRESENTING MYOCARDIAL INFARCTION       -COPD   CLINICAL FOLLOW-UP RECOMMENDED.   Troponin I-serum (0, 3, 6 hours)     Status: Abnormal   Collection Time: 12/18/14  4:55 AM  Result Value Ref Range   Troponin I 0.09 (H) <0.031 ng/mL    Comment:        PERSISTENTLY INCREASED TROPONIN VALUES IN THE RANGE OF 0.04-0.49 ng/mL CAN BE SEEN IN:       -UNSTABLE ANGINA       -CONGESTIVE HEART FAILURE       -MYOCARDITIS       -CHEST TRAUMA       -ARRYHTHMIAS       -LATE PRESENTING MYOCARDIAL INFARCTION       -COPD   CLINICAL FOLLOW-UP RECOMMENDED.     Dg Chest Port 1 View  12/17/2014   CLINICAL DATA:  Chest pain  EXAM: PORTABLE CHEST - 1 VIEW  COMPARISON:  None.  FINDINGS: Moderate enlargement of the cardiomediastinal silhouette is identified with central pulmonary arterial prominence and tapering which may suggest pulmonary arterial hypertension. Dual lead left-sided pacer is in place. Lungs are clear. No pleural effusion. Mild left shoulder degenerative change is identified.  IMPRESSION: Cardiomegaly without evidence for edema.  Pulmonary arterial prominence with tapering suggesting pulmonary arterial hypertension.   Electronically Signed   By: Conchita Paris M.D.   On: 12/17/2014 17:11    Review of Systems  Constitutional: Negative for fever and chills.  Eyes: Negative for blurred vision and double vision.  Cardiovascular:  Positive for chest pain. Negative for palpitations, orthopnea and claudication.  Gastrointestinal: Positive for nausea and vomiting.  Genitourinary: Negative for dysuria.  Neurological: Negative for dizziness and headaches.   Blood pressure 103/45, pulse 60, temperature 98.4 F (36.9 C), temperature source Oral, resp. rate 14, height 5' (1.524 m), weight 62.596 kg (138 lb), SpO2 98 %. Physical Exam  Constitutional: She is oriented to person, place, and time.  HENT:  Head: Normocephalic and atraumatic.  Eyes: Conjunctivae are normal. Pupils are equal, round, and reactive to light. Left eye exhibits no discharge. No scleral icterus.  Neck: Normal range of motion. Neck supple. No thyromegaly present.  Cardiovascular: Normal rate and regular rhythm.   Murmur (Soft systolic murmur noted) heard. Respiratory: Effort normal and breath sounds normal. No respiratory distress. She has no wheezes. She has no rales.  GI: Soft. Bowel sounds are normal.  Musculoskeletal: She exhibits no edema or tenderness.  Neurological: She is alert and oriented to person, place, and time.    Assessment/Plan: Acute coronary syndrome Coronary artery disease history of MI in the past status post PCI to LAD and left circumflex in the past as above X line Hypertension Hypercholesteremia Sick sinus syndrome status post permanent pacemaker History of  remote GI bleed in the past Degenerative joint disease Plan Discussed with patient and her daughter at length regarding various options of treatment i.e. medical versus left cath possible PTCA stenting its risk and benefits i.e. death MI stroke need for emergency CABG local vascular complications etc. and consented for PCI [facility] N 12/18/2014, 2:47 PM

## 2014-12-18 NOTE — Interval H&P Note (Signed)
Cath Lab Visit (complete for each Cath Lab visit)  Clinical Evaluation Leading to the Procedure:   ACS: Yes.    Non-ACS:    Anginal Classification: CCS IV  Anti-ischemic medical therapy: Maximal Therapy (2 or more classes of medications)  Non-Invasive Test Results: Intermediate-risk stress test findings: cardiac mortality 1-3%/year  Prior CABG: No previous CABG      History and Physical Interval Note:  12/18/2014 3:39 PM  [name]  has presented today for surgery, with the diagnosis of cp  The various methods of treatment have been discussed with the patient and family. After consideration of risks, benefits and other options for treatment, the patient has consented to  Procedure(s): LEFT HEART CATHETERIZATION WITH CORONARY ANGIOGRAM (N/A) as a surgical intervention .  The patient's history has been reviewed, patient examined, no change in status, stable for surgery.  I have reviewed the patient's chart and labs.  Questions were answered to the patient's satisfaction.     Robynn PaneHARWANI,Jozalynn Noyce N

## 2014-12-18 NOTE — Progress Notes (Signed)
Echocardiogram 2D Echocardiogram has been performed.  Dorothey BasemanReel, Shylie Polo M 12/18/2014, 1:22 PM

## 2014-12-18 NOTE — Progress Notes (Signed)
Paged on call NP with [facility] with troponin results of 0.04 and that patient was now pain free.   Jesse SansJanee' T. RN

## 2014-12-18 NOTE — Progress Notes (Signed)
RN paged with elevated troponin of 0.04. Only slightly bumped. 12/17/14 EKG reviewed from ED and compared to one in August of 2015. Twave abnormality present, but old EKG has the same. Pt, after GI cocktail and Zofran, is chest pain free. Likely atypical chest pain. Continue to cycle enzymes and pt is set for echo in the am.  Jimmye NormanKaren Kirby-Graham, NP [facility]

## 2014-12-19 ENCOUNTER — Encounter (HOSPITAL_COMMUNITY): Payer: Self-pay | Admitting: Cardiology

## 2014-12-19 NOTE — Discharge Instructions (Signed)
Cardiac Diet  A cardiac diet can help stop heart disease or a stroke from happening. It involves eating less unhealthy fats and eating more healthy fats.   FOODS TO AVOID OR LIMIT  · Limit saturated fats. This type of fat is found in oils and dairy products, such as:  ¨ Coconut oil.  ¨ Palm oil.  ¨ Cocoa butter.  ¨ Butter.  · Avoid trans-fat or hydrogenated oils. These are found in fried or pre-made baked goods, such as:  ¨ Margarine.  ¨ Pre-made cookies, cakes, and crackers.  · Limit processed meats (hot dogs, deli meats, sausage) to 3 ounces a week.  · Limit high-fat meats (marbled meats, fried chicken, or chicken with skin) to 3 ounces a week.  · Limit salt (sodium) to 1500 milligrams a day.  ·  Limit sweets and drinks with added sugar to no more than 5 servings a week. One serving is:  ¨ 1 tablespoon of sugar.  ¨ 1 tablespoon of jelly or jam.  ¨ ½ cup sorbet.  ¨ 1 cup lemonade.  ¨ ½ cup regular soda.  EAT MORE OF THE FOLLOWING FOODS  Fruit  · Eat 4 to 5 servings a day. One serving of fruit is:  ¨ 1 medium whole fruit.  ¨ ¼ cup dried fruit.  ¨ ½ cup of fresh, frozen, or canned fruit.  ¨ ½ cup 100% fruit juice.  Vegetables  · Eat 4 to 5 servings a day. One serving is:  ¨ 1 cup raw leafy vegetables.  ¨ ½ cup raw or cooked, cut-up vegetables.  ¨ ½ cup vegetable juice.  Whole Grains  · Eat 3 servings a day (1 ounce equals 1 serving).  Legumes (such as beans, peas, and lentils)   · Eat at least 4 servings a week (½ cup equals 1 serving).  Nuts and Seeds   · Eat at least 4 servings a week (¼ cup equals 1 serving).  Dietary Fiber  · Eat 20 to 30 grams a day. Some foods high in dietary fiber include:  ¨ Dried beans.  ¨ Citrus fruits.  ¨ Apples, bananas.  ¨ Broccoli, Brussels sprouts, and eggplant.  ¨ Oats.  Omega-3 Fats  · Eat food with omega-3 fats. You can also take a dietary pill (supplement) that has 1 gram of DHA and EPA. Have 3.5 ounces of fatty fish a week, such as:  ¨ Salmon.  ¨ Mackerel.  ¨ Albacore  tuna.  ¨ Sardines.  ¨ Lake trout.  ¨ Herring.  PREPARING YOUR FOOD  · Broil, bake, steam, or roast foods. Do not fry food. Do not cook food in butter (fat).  · Use non-stick cooking sprays.  · Remove skin from poultry, such as chicken and turkey.  · Remove fat from meat.  · Take the fat off the top of stews, soups, and gravy.  · Use lemon or herbs to flavor food instead of using butter or margarine.  · Use nonfat yogurt, salsa, or low-fat dressings for salads.  Document Released: 04/16/2012 Document Reviewed: 04/16/2012  [employer]® Patient Information ©2015 [employer]. This information is not intended to replace advice given to you by your health care provider. Make sure you discuss any questions you have with your health care provider.

## 2014-12-19 NOTE — Progress Notes (Signed)
Subjective:  Denies any chest pain or shortness of breath. Tolerated cardiac catheterization yesterday noted to have patent stents.  Objective:  Vital Signs in the last 24 hours: Temp:  [97.7 F (36.5 C)-98.8 F (37.1 C)] 98.4 F (36.9 C) (02/20 0405) Pulse Rate:  [57-70] 57 (02/20 0830) Resp:  [14-30] 18 (02/20 0605) BP: (90-132)/(34-100) 122/46 mmHg (02/20 1016) SpO2:  [94 %-98 %] 96 % (02/20 0605) Weight:  [62.778 kg (138 lb 6.4 oz)] 62.778 kg (138 lb 6.4 oz) (02/20 0405)  Intake/Output from previous day: 02/19 0701 - 02/20 0700 In: 548 [P.O.:300; I.V.:248] Out: -  Intake/Output from this shift:    Physical Exam: Neck: no adenopathy, no carotid bruit, no JVD and supple, symmetrical, trachea midline Lungs: clear to auscultation bilaterally Heart: regular rate and rhythm, S1, S2 normal and Soft systolic murmur noted Abdomen: soft, non-tender; bowel sounds normal; no masses,  no organomegaly Extremities: extremities normal, atraumatic, no cyanosis or edema and Right groin stable  Lab Results:  Recent Labs  12/17/14 1559  WBC 5.4  HGB 12.0  PLT 200    Recent Labs  12/17/14 1559  NA 140  K 3.8  CL 109  CO2 25  GLUCOSE 123*  BUN 13  CREATININE 0.97    Recent Labs  12/18/14 0110 12/18/14 0455  TROPONINI 0.04* 0.09*   Hepatic Function Panel  Recent Labs  12/17/14 1559  PROT 6.1  ALBUMIN 3.9  AST 17  ALT 12  ALKPHOS 48  BILITOT 0.6  BILIDIR 0.1  IBILI 0.5   No results for input(s): CHOL in the last 72 hours. No results for input(s): PROTIME in the last 72 hours.  Imaging: Imaging results have been reviewed and Dg Chest Port 1 View  12/17/2014   CLINICAL DATA:  Chest pain  EXAM: PORTABLE CHEST - 1 VIEW  COMPARISON:  None.  FINDINGS: Moderate enlargement of the cardiomediastinal silhouette is identified with central pulmonary arterial prominence and tapering which may suggest pulmonary arterial hypertension. Dual lead left-sided pacer is in place.  Lungs are clear. No pleural effusion. Mild left shoulder degenerative change is identified.  IMPRESSION: Cardiomegaly without evidence for edema.  Pulmonary arterial prominence with tapering suggesting pulmonary arterial hypertension.   Electronically Signed   By: Christiana PellantGretchen  Green M.D.   On: 12/17/2014 17:11    Cardiac Studies:  Assessment/Plan:  Acute coronary syndrome status post left cardiac cath noted to have patent stents no significant MI Coronary artery disease history of MI in the past status post PCI to LAD and left circumflex in the past as above X line Hypertension Hypercholesteremia Sick sinus syndrome status post permanent pacemaker History of remote GI bleed in the past Degenerative joint disease   plan Continue present management Okay to discharge from cardiac point of view follow-up with me in one week   Glenice Ciccone N 12/19/2014, 10:30 AM

## 2014-12-19 NOTE — Discharge Summary (Signed)
Discharge Summary  [name] AOZ:[id number]:[id number] DOB: 07/19/1914  PCP: Lupe CarneyMITCHELL,DEAN, MD  Admit date: 12/17/2014 Discharge date: 12/19/2014  Time spent: less than 30mins  Recommendations for Outpatient Follow-up:  1. F/u with cardiology  2. F/u with pcp  Discharge Diagnoses:  Active Hospital Problems   Diagnosis Date Noted  . Chest pain 12/17/2014  . HTN (hypertension) 04/11/2013  . CAD (coronary artery disease) 04/11/2013  . Hyperlipidemia     Resolved Hospital Problems   Diagnosis Date Noted Date Resolved  No resolved problems to display.    Discharge Condition: stable, chest pain free  Diet recommendation: heart healthy  Filed Weights   12/17/14 2035 12/18/14 0434 12/19/14 0405  Weight: 62.914 kg (138 lb 11.2 oz) 62.596 kg (138 lb) 62.778 kg (138 lb 6.4 oz)    History of present illness:  [name] is a 81 y.o. female presents with chest pain. Patient states taht she was eating her dinner and started to have pain in her lower chest around the epigastric area. Patient states that she took some NTG sublingual time three and had no relief. She in fact states that she started to vomit. The pain did not appear to go anywhere else. She states taht she had no shortness of breath with the pain. She had no loss of consciousness. She denies having any diarrhea. She states that since she had taken 3 NTG with no relief she decided to come to the hospital. She was given ASA by the EMS. She does have a history of GERD and also has a history CAD and has had stent placement with a pacer placement also. Currently she is pain free.  Hospital Course:  Active Problems:   CAD (coronary artery disease)   HTN (hypertension)   Hyperlipidemia   Chest pain  2. Chest Pain -when cooking, with diaphoresis, did not resolve with nitroglycerinx3. -admit for observation -mild elevated troponin, cardiology consulted (Dr. Sharyn LullHarwani), cardiac cath 2/19 pm, stable, recommended continue all current  meds and cleared for discharge on 2/20 -echo LVEF WNL, no WMA.   HTN -pressure is controlled -will continue with home medications   CAD -has had stent placement in the past -will monitor and rule out as above  4. Hyperlipidemia -continue statins  Consultants: 3. Cardiology Dr Sharyn LullHarwani  Procedures:  Cardiac cath 2/19  Antibiotics:  none  Discharge Exam: BP 122/46 mmHg  Pulse 57  Temp(Src) 98.4 F (36.9 C) (Oral)  Resp 18  Ht 5' (1.524 m)  Wt 62.778 kg (138 lb 6.4 oz)  BMI 27.03 kg/m2  SpO2 96%  General: calm and comfortable, very pleasant, able provide detailed history Eyes: PERRL, normal lids, irises & conjunctiva ENT: grossly normal hearing, lips & tongue Neck: no LAD, masses or thyromegaly Cardiovascular: RRR, no m/r/g. Trace LE edema. Respiratory: CTA bilaterally, no w/r/r. Normal respiratory effort. Abdomen: soft, ntnd Skin: no rash or induration seen on limited exam Musculoskeletal: grossly normal tone BUE/BLE Psychiatric: grossly normal mood and affect, speech fluent and appropriate Neurologic: grossly non-focal    Discharge Instructions You were cared for by a hospitalist during your hospital stay. If you have any questions about your discharge medications or the care you received while you were in the hospital after you are discharged, you can call the unit and asked to speak with the hospitalist on call if the hospitalist that took care of you is not available. Once you are discharged, your primary care physician will handle any further medical issues. Please note that  NO REFILLS for any discharge medications will be authorized once you are discharged, as it is imperative that you return to your primary care physician (or establish a relationship with a primary care physician if you do not have one) for your aftercare needs so that they can reassess your need for medications and monitor your lab values.      Discharge Instructions    Diet -  low sodium heart healthy    Complete by:  As directed      Increase activity slowly    Complete by:  As directed             Medication List    TAKE these medications        amLODipine 5 MG tablet  Commonly known as:  NORVASC  Take 5 mg by mouth daily.     aspirin 81 MG EC tablet  Take 81 mg by mouth every morning.     bimatoprost 0.03 % ophthalmic solution  Commonly known as:  LUMIGAN  Place 1 drop into the left eye at bedtime.     carvedilol 12.5 MG tablet  Commonly known as:  COREG  TAKE 1 TABLET BY MOUTH TWICE DAILY WITH A MEAL     clopidogrel 75 MG tablet  Commonly known as:  PLAVIX  Take 75 mg by mouth daily.     famotidine 20 MG tablet  Commonly known as:  PEPCID  Take 20 mg by mouth 2 (two) times daily.     folic acid 1 MG tablet  Commonly known as:  FOLVITE  TAKE 1 TABLET BY MOUTH EVERY DAY     furosemide 20 MG tablet  Commonly known as:  LASIX  Take 20 mg by mouth daily.     gabapentin 100 MG capsule  Commonly known as:  NEURONTIN  Take 100 mg by mouth 3 (three) times daily.     nitroGLYCERIN 0.2 mg/hr patch  Commonly known as:  NITRODUR - Dosed in mg/24 hr  Place 1 patch onto the skin every morning. Apply to chest wall & remove at night     prednisoLONE acetate 1 % ophthalmic suspension  Commonly known as:  PRED FORTE  Place 2 drops into the left eye 2 (two) times daily as needed.     PROCTOZONE-HC 2.5 % rectal cream  Generic drug:  hydrocortisone  Apply 1 application topically 2 (two) times daily as needed.     ramipril 10 MG capsule  Commonly known as:  ALTACE  Take 10 mg by mouth daily.  Notes to Patient:  12/20/14     rosuvastatin 5 MG tablet  Commonly known as:  CRESTOR  Take 5 mg by mouth daily.       No Known Allergies Follow-up Information    Follow up with [facility], MD In 2 weeks.   Specialty:  Family Medicine   Contact information:   [address] [place] [address] [phone]       Follow up  with Robynn Pane, MD In 1 week.   Specialty:  Cardiology   Contact information:   [address] [place] [address] [phone]        The results of significant diagnostics from this hospitalization (including imaging, microbiology, ancillary and laboratory) are listed below for reference.    Significant Diagnostic Studies: Dg Chest Port 1 View  12/17/2014   CLINICAL DATA:  Chest pain  EXAM: PORTABLE CHEST - 1 VIEW  COMPARISON:  None.  FINDINGS: Moderate enlargement of the cardiomediastinal silhouette is identified with central pulmonary arterial prominence and tapering which may suggest pulmonary arterial hypertension. Dual lead left-sided pacer is in place. Lungs are clear. No pleural effusion. Mild left shoulder degenerative change is identified.  IMPRESSION: Cardiomegaly without evidence for edema.  Pulmonary arterial prominence with tapering suggesting pulmonary arterial hypertension.   Electronically Signed   By: Christiana Pellant M.D.   On: 12/17/2014 17:11    Microbiology: No results found for this or any previous visit (from the past 240 hour(s)).   Labs: Basic Metabolic Panel:  Recent Labs Lab 12/17/14 1559  NA 140  K 3.8  CL 109  CO2 25  GLUCOSE 123*  BUN 13  CREATININE 0.97  CALCIUM 9.1   Liver Function Tests:  Recent Labs Lab 12/17/14 1559  AST 17  ALT 12  ALKPHOS 48  BILITOT 0.6  PROT 6.1  ALBUMIN 3.9    Recent Labs Lab 12/17/14 1559  LIPASE 32   No results for input(s): AMMONIA in the last 168 hours. CBC:  Recent Labs Lab 12/17/14 1559  WBC 5.4  HGB 12.0  HCT 36.4  MCV 90.1  PLT 200   Cardiac Enzymes:  Recent Labs Lab 12/17/14 2231 12/18/14 0110 12/18/14 0455  TROPONINI 0.04* 0.04* 0.09*   BNP: BNP (last 3 results) No results for input(s): BNP in the last 8760 hours.  ProBNP (last 3 results) No results for input(s): PROBNP in the last 8760 hours.  CBG: No results for input(s): GLUCAP in the last  168 hours.     Signed:  Javonte Elenes  [facility] 12/19/2014, 12:41 PM

## 2014-12-19 NOTE — Progress Notes (Signed)
UR completed 

## 2014-12-19 NOTE — Op Note (Signed)
[name], [name]               ACCOUNT NO.:  [id number]  MEDICAL RECORD NO.:  [id number]  LOCATION:  [id number]                        FACILITY:  [facility]  PHYSICIAN:  Eduardo OsierMohan N. Sharyn LullHarwani, M.D. DATE OF BIRTH:  07/19/1914  DATE OF PROCEDURE:  12/18/2014 DATE OF DISCHARGE:                              OPERATIVE REPORT   PROCEDURE:  Left cardiac cath with selective left and right coronary angiography, measurement of LVEDP via right groin using Judkins technique.  INDICATION FOR THE PROCEDURE:  Ms. [name] is a 81 year old female with past medical history significant for coronary artery disease, history of anteroseptal wall myocardial infarction in the past, status post PTCA stenting to LAD in September of 2011, subsequently had PTCA and stenting to left circumflex in April of 2013, hypertension, hypercholesteremia, sick sinus syndrome, status post permanent pacemaker, history of remote GI bleed in the past, was admitted yesterday because of vague retrosternal chest pain associated with nausea, vomiting.  She took 3 sublingual nitro without much relief, so she called EMS and was admitted.  EKG showed no new acute ischemic changes.  Her first set of troponin was minimally elevated which is trending slightly up.  The patient presently denies any chest pain. Denies shortness of breath.  Denies palpitation, lightheadedness, or syncope.  Typical anginal chest pain, minimally elevated troponin I and multiple risk factors discussed with the patient and her daughter at length, various options of treatment, i.e., medical versus invasive, left cath, possible PTCA stenting, its risks and benefits, i.e., death, MI, stroke, need for emergency CABG, local vascular complications, etc., and consented for PCI.  DESCRIPTION OF PROCEDURE:  After obtaining the informed consent, the patient was brought to the cath lab and was placed on fluoroscopy table. Right groin was prepped and draped in usual  fashion.  A 1% Xylocaine was used for local anesthesia in the right groin.  With the help of thin wall needle, 5-French arterial sheath was placed.  The sheath was aspirated and flushed.  Next, 5-French left Judkins catheter was advanced over the wire under fluoroscopic guidance up to the ascending aorta.  Wire was pulled out.  The catheter was aspirated and connected to the Manifold.  Catheter was further advanced and engaged into left coronary ostium.  Multiple views of the left system were taken.  Next, catheter was disengaged and was pulled out over the wire and was replaced with 5-French right Judkins catheter, which was advanced over the wire under fluoroscopic guidance up to the ascending aorta.  Wire was pulled out.  The catheter was aspirated and connected to the Manifold.  Catheter was further advanced and engaged into right coronary ostium.  Multiple views of the right system were taken.  Next, catheter was disengaged and was pulled out over the wire and was replaced with 5- French pigtail catheter, which was advanced over the wire under fluoroscopic guidance up to the ascending aorta.  Wire was pulled out. The catheter was aspirated and connected to the Manifold.  Catheter was further advanced across the aortic valve into the LV.  LV pressures were recorded and then pullback pressures were recorded from the aorta. There was no gradient across the aortic valve.  Next, the pigtail catheter was pulled out over the wire.  Sheaths were aspirated, flushed.  FINDINGS:  LVEDP was 15 mmHg.  Left main was patent.  LAD has 10-15% proximal and 20-30% mid stenosis prior to the stenting and stented segment was widely patent.  Diagonal 1 was very, very small.  Diagonal 2 has 40-50% ostial stenosis and left circumflex has 40-50% mid stenosis prior to the stent.  Stented segment is widely patent.  OM1 is moderate sized, which has 30-40% mid stenosis.  OM2 and 3 were very, very small. OM4 was  small which were patent.  RCA has 15-20% mid stenosis and 20-30% distal stenosis.  PDA and PLV branches were small which were patent.  The patient tolerated the procedure well.  There were no complications.  The patient was transferred to recovery room in stable condition.  PLAN:  To maximize antianginal medications and continue to be treated medically.     Eduardo Osier. [name], M.D.     [facility]/MEDQ  D:  12/18/2014  T:  12/19/2014  Job:  [id number]

## 2014-12-20 DIAGNOSIS — R079 Chest pain, unspecified: Secondary | ICD-10-CM

## 2014-12-29 ENCOUNTER — Encounter: Payer: Self-pay | Admitting: Cardiology

## 2015-03-04 DIAGNOSIS — H4011X3 Primary open-angle glaucoma, severe stage: Secondary | ICD-10-CM | POA: Diagnosis not present

## 2015-03-04 DIAGNOSIS — Z9883 Filtering (vitreous) bleb after glaucoma surgery status: Secondary | ICD-10-CM | POA: Diagnosis not present

## 2015-03-04 DIAGNOSIS — H4011X1 Primary open-angle glaucoma, mild stage: Secondary | ICD-10-CM | POA: Diagnosis not present

## 2015-03-11 DIAGNOSIS — H2000 Unspecified acute and subacute iridocyclitis: Secondary | ICD-10-CM | POA: Diagnosis not present

## 2015-03-17 ENCOUNTER — Ambulatory Visit (INDEPENDENT_AMBULATORY_CARE_PROVIDER_SITE_OTHER): Payer: Medicare PPO | Admitting: *Deleted

## 2015-03-17 DIAGNOSIS — I495 Sick sinus syndrome: Secondary | ICD-10-CM | POA: Diagnosis not present

## 2015-03-17 NOTE — Progress Notes (Signed)
Remote pacemaker transmission.   

## 2015-03-24 LAB — CUP PACEART REMOTE DEVICE CHECK
Battery Remaining Percentage: 1 % — CL
Battery Voltage: 2.92 V
Brady Statistic RA Percent Paced: 92 %
Brady Statistic RV Percent Paced: 55 %
Date Time Interrogation Session: 20160525155636
Lead Channel Impedance Value: 460 Ohm
Lead Channel Impedance Value: 530 Ohm
Lead Channel Pacing Threshold Amplitude: 0.625 V
Lead Channel Pacing Threshold Amplitude: 0.625 V
Lead Channel Pacing Threshold Pulse Width: 0.4 ms
Lead Channel Pacing Threshold Pulse Width: 0.4 ms
Lead Channel Sensing Intrinsic Amplitude: 12 mV
Lead Channel Sensing Intrinsic Amplitude: 2.4 mV
Lead Channel Setting Pacing Amplitude: 0.875
Lead Channel Setting Pacing Amplitude: 2 V
Lead Channel Setting Pacing Pulse Width: 0.4 ms
Lead Channel Setting Sensing Sensitivity: 2 mV
Pulse Gen Model: 2210
Pulse Gen Serial Number: 2561895

## 2015-04-02 ENCOUNTER — Encounter: Payer: Self-pay | Admitting: Cardiology

## 2015-04-02 DIAGNOSIS — H2000 Unspecified acute and subacute iridocyclitis: Secondary | ICD-10-CM | POA: Diagnosis not present

## 2015-04-09 ENCOUNTER — Encounter: Payer: Self-pay | Admitting: Internal Medicine

## 2015-04-12 DIAGNOSIS — H2000 Unspecified acute and subacute iridocyclitis: Secondary | ICD-10-CM | POA: Diagnosis not present

## 2015-04-29 DIAGNOSIS — M150 Primary generalized (osteo)arthritis: Secondary | ICD-10-CM | POA: Diagnosis not present

## 2015-04-29 DIAGNOSIS — R102 Pelvic and perineal pain: Secondary | ICD-10-CM | POA: Diagnosis not present

## 2015-04-29 DIAGNOSIS — I2510 Atherosclerotic heart disease of native coronary artery without angina pectoris: Secondary | ICD-10-CM | POA: Diagnosis not present

## 2015-04-29 DIAGNOSIS — I10 Essential (primary) hypertension: Secondary | ICD-10-CM | POA: Diagnosis not present

## 2015-04-29 DIAGNOSIS — K219 Gastro-esophageal reflux disease without esophagitis: Secondary | ICD-10-CM | POA: Diagnosis not present

## 2015-04-29 DIAGNOSIS — N952 Postmenopausal atrophic vaginitis: Secondary | ICD-10-CM | POA: Diagnosis not present

## 2015-06-08 ENCOUNTER — Encounter: Payer: Self-pay | Admitting: Cardiovascular Disease

## 2015-06-15 ENCOUNTER — Encounter: Payer: Self-pay | Admitting: Cardiovascular Disease

## 2015-06-15 ENCOUNTER — Ambulatory Visit (INDEPENDENT_AMBULATORY_CARE_PROVIDER_SITE_OTHER): Payer: Medicare PPO | Admitting: Cardiovascular Disease

## 2015-06-15 VITALS — BP 122/66 | HR 63 | Resp 20 | Ht 60.0 in | Wt 129.0 lb

## 2015-06-15 DIAGNOSIS — Z950 Presence of cardiac pacemaker: Secondary | ICD-10-CM | POA: Diagnosis not present

## 2015-06-15 DIAGNOSIS — I25118 Atherosclerotic heart disease of native coronary artery with other forms of angina pectoris: Secondary | ICD-10-CM | POA: Diagnosis not present

## 2015-06-15 DIAGNOSIS — I495 Sick sinus syndrome: Secondary | ICD-10-CM

## 2015-06-15 DIAGNOSIS — E785 Hyperlipidemia, unspecified: Secondary | ICD-10-CM | POA: Diagnosis not present

## 2015-06-15 DIAGNOSIS — I10 Essential (primary) hypertension: Secondary | ICD-10-CM | POA: Diagnosis not present

## 2015-06-15 NOTE — Progress Notes (Signed)
Patient ID: [name], female   DOB: 09/27/1918, 81 y.o.   MRN: [id number] PAGNOTTAdiology Office Note   Date:  06/17/2015   ID:  [name], [name] 1917/12/30, MRN [id number]  PCP:  Lupe Carney, MD  Cardiologist: Judie Petit. [name], MD;  Thurmon Fair, MD   Chief Complaint  Patient presents with  . Phys Pacer Check    Patient had chest pain last week. She has had swelling in her legs, hands, and feet.      History of Present Illness: [name] is a 81 y.o. female who presents for pacemaker follow-up. She has sinus node dysfunction and a dual-chamber permanent pacemaker implanted in 2011. She has intermittent asymptomatic paroxysmal atrial fibrillation with controlled ventricular rate and has a history of coronary artery disease reporting 3 previous myocardial infarctions. She is known to have a 70% stenosis in her left circumflex coronary artery. She has treated hyperlipidemia and hypertension. She is felt to be at very high risk of bleeding due to his her small body habitus and unsteady gait. She is on aspirin and clopidogrel rather than warfarin or a direct oral anticoagulant.  She had a brief episode of chest discomfort last week that resolved spontaneously. Otherwise she is never troubled by angina pectoris. She complains of swelling but seems to be describing joint issues.  Interrogation of her pacemaker shows estimated generator longevity of 7-8 years, 93% atrial pacing, 61% ventricular pacing. The percentage of ventricular pacing is gradually increasing due to a very long AV conduction time, despite MVP. She's had 2 significant episodes of atrial fibrillation with controlled ventricular rate. One episode lasted for 7 hours and occurred in July another episode lasted for 2 hours in September 2015. Neither one of these was associated with symptoms.    Past Medical History  Diagnosis Date  . Hypertension   . Coronary artery disease   . Myocardial infarction     x 3  .  Angina     on nitro-dur  . Shortness of breath     with exertion  . GERD (gastroesophageal reflux disease)   . Hyperlipidemia   . Glaucoma   . History of eye surgery     bilateral   . Anemia   . Pacemaker   . H/O myocardial perfusion scan 07/13/2011    The post stress left ventricle is normal in size. the rest left ventricle is normal in size. there is no scintigraphic evidence of inducible myocardial ishemia. The post-stress left ventricular function is normal. the post-stress ejection fraction is 66%.This is a low risk scan.    Past Surgical History  Procedure Laterality Date  . Cardiac catherization  06/2010  . Cardiac catheterization    . Pacemaker insertion  06/2010  . Svd      x 2  . Appendectomy    . Thyroidectomy    . Hysteroscopy w/d&c  09/07/2011    Procedure: DILATATION AND CURETTAGE (D&C) /HYSTEROSCOPY;  Surgeon: Turner Daniels, MD;  Location: [facility] ORS;  Service: Gynecology;  Laterality: N/A;  uterus  . Coronary angioplasty with stent placement  02/13/2012  . Left heart catheterization with coronary angiogram N/A 01/25/2012    Procedure: LEFT HEART CATHETERIZATION WITH CORONARY ANGIOGRAM;  Surgeon: Robynn Pane, MD;  Location: [facility] CATH LAB;  Service: Cardiovascular;  Laterality: N/A;  . Percutaneous coronary stent intervention (pci-s) N/A 02/13/2012    Procedure: PERCUTANEOUS CORONARY STENT INTERVENTION (PCI-S);  Surgeon: Robynn Pane, MD;  Location: [facility]  CATH LAB;  Service: Cardiovascular;  Laterality: N/A;  . Left heart catheterization with coronary angiogram N/A 12/18/2014    Procedure: LEFT HEART CATHETERIZATION WITH CORONARY ANGIOGRAM;  Surgeon: Robynn Pane, MD;  Location: [facility] CATH LAB;  Service: Cardiovascular;  Laterality: N/A;  . Cardiac catheterization Left 07/19/2010    LV showed good LV systolic function, LVH EF of 60 % to 65 %. Left main was patent. LAD has 20 % to 30 % proximal stenosis and 99 % mid stenosis with TIMI 2 distal flow. Diagonal-1 was very, very small.  Diagonal-2 was very small, Diagonal-3 was small which has 30 % to 40 % ostial stenosis just before the bifurcation with LAD stenosis. Left circumflex has 40 % to 50 % mid stenosis and 70 % to 75 % .     Current Outpatient Prescriptions  Medication Sig Dispense Refill  . amLODipine (NORVASC) 5 MG tablet Take 5 mg by mouth daily.    Marland Kitchen aspirin 81 MG EC tablet Take 81 mg by mouth every morning.     . bimatoprost (LUMIGAN) 0.03 % ophthalmic solution Place 1 drop into the left eye at bedtime.     . carvedilol (COREG) 12.5 MG tablet TAKE 1 TABLET BY MOUTH TWICE DAILY WITH A MEAL 60 tablet 10  . clopidogrel (PLAVIX) 75 MG tablet Take 75 mg by mouth daily.    . famotidine (PEPCID) 20 MG tablet Take 20 mg by mouth 2 (two) times daily.     . folic acid (FOLVITE) 1 MG tablet TAKE 1 TABLET BY MOUTH EVERY DAY 30 tablet 10  . furosemide (LASIX) 20 MG tablet Take 20 mg by mouth daily.    Marland Kitchen gabapentin (NEURONTIN) 100 MG capsule Take 100 mg by mouth 3 (three) times daily.    . nitroGLYCERIN (NITRODUR - DOSED IN MG/24 HR) 0.2 mg/hr Place 1 patch onto the skin every morning. Apply to chest wall & remove at night    . prednisoLONE acetate (PRED FORTE) 1 % ophthalmic suspension Place 2 drops into the left eye 2 (two) times daily as needed.    Marland Kitchen PROCTOZONE-HC 2.5 % rectal cream Apply 1 application topically 2 (two) times daily as needed.  0  . ramipril (ALTACE) 10 MG capsule Take 10 mg by mouth daily.     . rosuvastatin (CRESTOR) 5 MG tablet Take 5 mg by mouth daily.     No current facility-administered medications for this visit.    Allergies:   Shellfish allergy    Social History:  The patient  reports that she quit smoking about 23 years ago. Her smoking use included Cigarettes. She smoked 0.50 packs per day. She has never used smokeless tobacco. She reports that she does not drink alcohol or use illicit drugs.   Family History:  The patient's family history includes Cancer in her sister; Diabetes in her  mother.    ROS:  Please see the history of present illness.    Otherwise, review of systems positive for none.   All other systems are reviewed and negative.    PHYSICAL EXAM: VS:  BP 122/66 mmHg  Pulse 63  Resp 20  Ht 5' (1.524 m)  Wt 129 lb (58.514 kg)  BMI 25.19 kg/m2 , BMI Body mass index is 25.19 kg/(m^2). General: Alert, oriented x3, no distress Head: no evidence of trauma, PERRL, EOMI, no exophtalmos or lid lag, no myxedema, no xanthelasma; normal ears, nose and oropharynx Neck: normal jugular venous pulsations and no hepatojugular reflux; brisk  carotid pulses without delay and no carotid bruits Chest: clear to auscultation, no signs of consolidation by percussion or palpation, normal fremitus, symmetrical and full respiratory excursions, left subclavian pacemaker site appears healthy Cardiovascular: normal position and quality of the apical impulse, regular rhythm, normal first and second heart sounds, no murmurs, rubs or gallops Abdomen: no tenderness or distention, no masses by palpation, no abnormal pulsatility or arterial bruits, normal bowel sounds, no hepatosplenomegaly Extremities: no clubbing, cyanosis or edema; 2+ radial, ulnar and brachial pulses bilaterally; 2+ right femoral, posterior tibial and dorsalis pedis pulses; 2+ left femoral, posterior tibial and dorsalis pedis pulses; no subclavian or femoral bruits Neurological: grossly nonfocal Psych: euthymic mood, full affect   EKG:  EKG is ordered today. It shows atrial paced ventricular sensed rhythm with pre-existing left bundle branch block  Recent Labs: 12/17/2014: ALT 12; BUN 13; Creatinine, Ser 0.97; Hemoglobin 12.0; Platelets 200; Potassium 3.8; Sodium 140    Lipid Panel    Component Value Date/Time   CHOL  07/20/2010 0600    185        ATP III CLASSIFICATION:  <200     mg/dL   Desirable  409-811  mg/dL   Borderline High  >=914    mg/dL   High          TRIG 43 07/20/2010 0600   HDL 65 07/20/2010 0600    CHOLHDL 2.8 07/20/2010 0600   VLDL 9 07/20/2010 0600   LDLCALC * 07/20/2010 0600    111        Total Cholesterol/HDL:CHD Risk Coronary Heart Disease Risk Table                     Men   Women  1/2 Average Risk   3.4   3.3  Average Risk       5.0   4.4  2 X Average Risk   9.6   7.1  3 X Average Risk  23.4   11.0        Use the calculated Patient Ratio above and the CHD Risk Table to determine the patient's CHD Risk.        ATP III CLASSIFICATION (LDL):  <100     mg/dL   Optimal  782-956  mg/dL   Near or Above                    Optimal  130-159  mg/dL   Borderline  213-086  mg/dL   High  >578     mg/dL   Very High      Wt Readings from Last 3 Encounters:  06/15/15 129 lb (58.514 kg)  12/19/14 138 lb 6.4 oz (62.778 kg)  06/09/14 129 lb 14.4 oz (58.922 kg)     ASSESSMENT AND PLAN:  1. Paroxysmal atrial fibrillation. The overall burden of arrhythmia is extremely low. Obviously she is at high embolic risk (CHADSVasc score 4), I also believe she is very elderly, frail and unsteady. I'm reluctant to start full anticoagulation. Continue aspirin and clopidogrel which also have the benefit of providing medical therapy for her vascular problems  2. Sinus node dysfunction and prolonged AV conduction time and intraventricular conduction abnormalities, with a normally functioning dual-chamber permanent pacemaker. She's had gradually increasing ventricular pacing. Unfortunately, I don't think we can prevent this regardless of how reprogramming her device. She does not have evidence of congestive heart failure.  3. Dual-chamber permanent pacemaker with normal function . Adjusted atrial sensitivity for  occasional undersensing.   4. Coronary artery disease with a known moderate to severe left circumflex coronary artery stenosis and infrequent episodes of angina pectoris. Discuss adjustment in antianginal therapy, if necessary, with Dr. [name].     Current medicines are reviewed at  length with the patient today.  The patient does not have concerns regarding medicines.  The following changes have been made:  no change  Labs/ tests ordered today include:  Orders Placed This Encounter  Procedures  . EKG 12-Lead    Patient Instructions  Remote monitoring is used to monitor your Pacemaker or ICD from home. This monitoring reduces the number of office visits required to check your device to one time per year. It allows [place] to monitor the functioning of your device to ensure it is working properly. You are scheduled for a device check from home on September 17, 2015. You may send your transmission at any time that day. If you have a wireless device, the transmission will be sent automatically. After your physician reviews your transmission, you will receive a postcard with your next transmission date.  Dr. Royann Shivers recommends that you schedule a follow-up appointment in: ONE YEAR.     Joie Bimler, MD  06/17/2015 4:23 PM    Thurmon Fair, MD, [facility] [phone] office [phone] pager

## 2015-06-15 NOTE — Patient Instructions (Signed)
Remote monitoring is used to monitor your Pacemaker or ICD from home. This monitoring reduces the number of office visits required to check your device to one time per year. It allows us to monitor the functioning of your device to ensure it is working properly. You are scheduled for a device check from home on September 17, 2015. You may send your transmission at any time that day. If you have a wireless device, the transmission will be sent automatically. After your physician reviews your transmission, you will receive a postcard with your next transmission date.  Dr. Croitoru recommends that you schedule a follow-up appointment in: ONE YEAR     

## 2015-06-17 ENCOUNTER — Encounter: Payer: Self-pay | Admitting: Cardiovascular Disease

## 2015-06-18 LAB — CUP PACEART INCLINIC DEVICE CHECK
Battery Remaining Percentage: 73 %
Battery Voltage: 2.92 V
Brady Statistic RA Percent Paced: 93 %
Brady Statistic RV Percent Paced: 61 %
Date Time Interrogation Session: 20160819135058
Lead Channel Impedance Value: 460 Ohm
Lead Channel Impedance Value: 540 Ohm
Lead Channel Pacing Threshold Amplitude: 0.5 V
Lead Channel Pacing Threshold Amplitude: 0.625 V
Lead Channel Pacing Threshold Pulse Width: 0.4 ms
Lead Channel Pacing Threshold Pulse Width: 0.4 ms
Lead Channel Sensing Intrinsic Amplitude: 12 mV
Lead Channel Sensing Intrinsic Amplitude: 2 mV
Lead Channel Setting Pacing Amplitude: 0.875
Lead Channel Setting Pacing Amplitude: 2 V
Lead Channel Setting Pacing Pulse Width: 0.4 ms
Lead Channel Setting Sensing Sensitivity: 2 mV
Pulse Gen Model: 2210
Pulse Gen Serial Number: 2561895

## 2015-06-23 DIAGNOSIS — M1990 Unspecified osteoarthritis, unspecified site: Secondary | ICD-10-CM | POA: Diagnosis not present

## 2015-06-23 DIAGNOSIS — I2510 Atherosclerotic heart disease of native coronary artery without angina pectoris: Secondary | ICD-10-CM | POA: Diagnosis not present

## 2015-06-23 DIAGNOSIS — I495 Sick sinus syndrome: Secondary | ICD-10-CM | POA: Diagnosis not present

## 2015-06-23 DIAGNOSIS — I10 Essential (primary) hypertension: Secondary | ICD-10-CM | POA: Diagnosis not present

## 2015-06-23 DIAGNOSIS — I252 Old myocardial infarction: Secondary | ICD-10-CM | POA: Diagnosis not present

## 2015-06-23 DIAGNOSIS — I209 Angina pectoris, unspecified: Secondary | ICD-10-CM | POA: Diagnosis not present

## 2015-06-23 DIAGNOSIS — E785 Hyperlipidemia, unspecified: Secondary | ICD-10-CM | POA: Diagnosis not present

## 2015-06-29 ENCOUNTER — Other Ambulatory Visit: Payer: Self-pay

## 2015-06-29 DIAGNOSIS — Z1231 Encounter for screening mammogram for malignant neoplasm of breast: Secondary | ICD-10-CM

## 2015-07-09 DIAGNOSIS — H4011X3 Primary open-angle glaucoma, severe stage: Secondary | ICD-10-CM | POA: Diagnosis not present

## 2015-07-12 ENCOUNTER — Encounter: Payer: Self-pay | Admitting: Cardiovascular Disease

## 2015-07-12 DIAGNOSIS — H4011X3 Primary open-angle glaucoma, severe stage: Secondary | ICD-10-CM | POA: Diagnosis not present

## 2015-07-12 DIAGNOSIS — H2000 Unspecified acute and subacute iridocyclitis: Secondary | ICD-10-CM | POA: Diagnosis not present

## 2015-07-16 ENCOUNTER — Encounter: Payer: Self-pay | Admitting: Cardiovascular Disease

## 2015-07-16 DIAGNOSIS — G5601 Carpal tunnel syndrome, right upper limb: Secondary | ICD-10-CM | POA: Diagnosis not present

## 2015-07-19 DIAGNOSIS — H2000 Unspecified acute and subacute iridocyclitis: Secondary | ICD-10-CM | POA: Diagnosis not present

## 2015-07-22 ENCOUNTER — Ambulatory Visit: Payer: Medicare PPO

## 2015-08-10 DIAGNOSIS — G5601 Carpal tunnel syndrome, right upper limb: Secondary | ICD-10-CM | POA: Diagnosis not present

## 2015-08-10 DIAGNOSIS — G5602 Carpal tunnel syndrome, left upper limb: Secondary | ICD-10-CM | POA: Diagnosis not present

## 2015-08-26 DIAGNOSIS — K6289 Other specified diseases of anus and rectum: Secondary | ICD-10-CM | POA: Diagnosis not present

## 2015-08-30 ENCOUNTER — Other Ambulatory Visit: Payer: Self-pay | Admitting: Cardiovascular Disease

## 2015-08-31 ENCOUNTER — Other Ambulatory Visit: Payer: Self-pay | Admitting: Cardiovascular Disease

## 2015-09-06 DIAGNOSIS — B0059 Other herpesviral disease of eye: Secondary | ICD-10-CM | POA: Diagnosis not present

## 2015-09-13 ENCOUNTER — Other Ambulatory Visit: Payer: Self-pay | Admitting: Cardiovascular Disease

## 2015-09-13 DIAGNOSIS — H16101 Unspecified superficial keratitis, right eye: Secondary | ICD-10-CM | POA: Diagnosis not present

## 2015-09-15 ENCOUNTER — Other Ambulatory Visit: Payer: Self-pay

## 2015-09-15 MED ORDER — FOLIC ACID 1 MG PO TABS: 1.0000 mg | ORAL_TABLET | Freq: Every day | ORAL | Status: DC

## 2015-09-16 ENCOUNTER — Ambulatory Visit (INDEPENDENT_AMBULATORY_CARE_PROVIDER_SITE_OTHER): Payer: Medicare PPO | Admitting: *Deleted

## 2015-09-16 DIAGNOSIS — I495 Sick sinus syndrome: Secondary | ICD-10-CM | POA: Diagnosis not present

## 2015-09-16 NOTE — Progress Notes (Signed)
Remote pacemaker transmission.   

## 2015-09-22 DIAGNOSIS — I10 Essential (primary) hypertension: Secondary | ICD-10-CM | POA: Diagnosis not present

## 2015-09-22 DIAGNOSIS — I495 Sick sinus syndrome: Secondary | ICD-10-CM | POA: Diagnosis not present

## 2015-09-22 DIAGNOSIS — I2510 Atherosclerotic heart disease of native coronary artery without angina pectoris: Secondary | ICD-10-CM | POA: Diagnosis not present

## 2015-09-22 DIAGNOSIS — I208 Other forms of angina pectoris: Secondary | ICD-10-CM | POA: Diagnosis not present

## 2015-09-22 DIAGNOSIS — M1990 Unspecified osteoarthritis, unspecified site: Secondary | ICD-10-CM | POA: Diagnosis not present

## 2015-09-22 DIAGNOSIS — Z950 Presence of cardiac pacemaker: Secondary | ICD-10-CM | POA: Diagnosis not present

## 2015-09-22 DIAGNOSIS — I252 Old myocardial infarction: Secondary | ICD-10-CM | POA: Diagnosis not present

## 2015-09-22 DIAGNOSIS — E785 Hyperlipidemia, unspecified: Secondary | ICD-10-CM | POA: Diagnosis not present

## 2015-10-01 LAB — CUP PACEART REMOTE DEVICE CHECK
Battery Remaining Longevity: 75 mo
Battery Remaining Percentage: 65 %
Battery Voltage: 2.9 V
Brady Statistic RA Percent Paced: 89 %
Brady Statistic RV Percent Paced: 71 %
Date Time Interrogation Session: 20161202144400
Implantable Lead Implant Date: 20110923
Implantable Lead Implant Date: 20110923
Implantable Lead Location: 753859
Implantable Lead Location: 753860
Lead Channel Impedance Value: 460 Ohm
Lead Channel Impedance Value: 550 Ohm
Lead Channel Pacing Threshold Amplitude: 0.625 V
Lead Channel Pacing Threshold Amplitude: 0.75 V
Lead Channel Pacing Threshold Pulse Width: 0.4 ms
Lead Channel Pacing Threshold Pulse Width: 0.4 ms
Lead Channel Sensing Intrinsic Amplitude: 1.2 mV
Lead Channel Sensing Intrinsic Amplitude: 12 mV
Lead Channel Setting Pacing Amplitude: 0.875
Lead Channel Setting Pacing Amplitude: 2 V
Lead Channel Setting Pacing Pulse Width: 0.4 ms
Lead Channel Setting Sensing Sensitivity: 2 mV
Pulse Gen Model: 2210
Pulse Gen Serial Number: 2561895

## 2015-10-04 ENCOUNTER — Encounter: Payer: Self-pay | Admitting: Cardiology

## 2015-10-29 DIAGNOSIS — N952 Postmenopausal atrophic vaginitis: Secondary | ICD-10-CM | POA: Diagnosis not present

## 2015-10-29 DIAGNOSIS — M150 Primary generalized (osteo)arthritis: Secondary | ICD-10-CM | POA: Diagnosis not present

## 2015-10-29 DIAGNOSIS — R102 Pelvic and perineal pain: Secondary | ICD-10-CM | POA: Diagnosis not present

## 2015-10-29 DIAGNOSIS — I2510 Atherosclerotic heart disease of native coronary artery without angina pectoris: Secondary | ICD-10-CM | POA: Diagnosis not present

## 2015-10-29 DIAGNOSIS — I10 Essential (primary) hypertension: Secondary | ICD-10-CM | POA: Diagnosis not present

## 2015-10-29 DIAGNOSIS — Z23 Encounter for immunization: Secondary | ICD-10-CM | POA: Diagnosis not present

## 2015-10-29 DIAGNOSIS — K219 Gastro-esophageal reflux disease without esophagitis: Secondary | ICD-10-CM | POA: Diagnosis not present

## 2015-11-18 ENCOUNTER — Encounter (HOSPITAL_COMMUNITY): Payer: Self-pay

## 2015-11-18 ENCOUNTER — Observation Stay (HOSPITAL_COMMUNITY)
Admission: EM | Admit: 2015-11-18 | Discharge: 2015-11-20 | Disposition: A | Discharge status: Home / Self Care | Payer: Medicare Other | Attending: Cardiology | Admitting: Cardiology

## 2015-11-18 ENCOUNTER — Emergency Department (HOSPITAL_COMMUNITY): Payer: Medicare Other

## 2015-11-18 DIAGNOSIS — I25110 Atherosclerotic heart disease of native coronary artery with unstable angina pectoris: Principal | ICD-10-CM | POA: Insufficient documentation

## 2015-11-18 DIAGNOSIS — R079 Chest pain, unspecified: Secondary | ICD-10-CM | POA: Diagnosis present

## 2015-11-18 DIAGNOSIS — I252 Old myocardial infarction: Secondary | ICD-10-CM | POA: Insufficient documentation

## 2015-11-18 DIAGNOSIS — I10 Essential (primary) hypertension: Secondary | ICD-10-CM | POA: Diagnosis not present

## 2015-11-18 DIAGNOSIS — E785 Hyperlipidemia, unspecified: Secondary | ICD-10-CM | POA: Insufficient documentation

## 2015-11-18 DIAGNOSIS — I200 Unstable angina: Secondary | ICD-10-CM | POA: Diagnosis present

## 2015-11-18 DIAGNOSIS — I480 Paroxysmal atrial fibrillation: Secondary | ICD-10-CM | POA: Diagnosis not present

## 2015-11-18 DIAGNOSIS — R109 Unspecified abdominal pain: Secondary | ICD-10-CM | POA: Diagnosis not present

## 2015-11-18 DIAGNOSIS — R197 Diarrhea, unspecified: Secondary | ICD-10-CM | POA: Diagnosis not present

## 2015-11-18 DIAGNOSIS — Z91013 Allergy to seafood: Secondary | ICD-10-CM | POA: Insufficient documentation

## 2015-11-18 DIAGNOSIS — Z87891 Personal history of nicotine dependence: Secondary | ICD-10-CM | POA: Insufficient documentation

## 2015-11-18 DIAGNOSIS — Z950 Presence of cardiac pacemaker: Secondary | ICD-10-CM | POA: Diagnosis not present

## 2015-11-18 DIAGNOSIS — Z7982 Long term (current) use of aspirin: Secondary | ICD-10-CM | POA: Insufficient documentation

## 2015-11-18 DIAGNOSIS — Z955 Presence of coronary angioplasty implant and graft: Secondary | ICD-10-CM | POA: Insufficient documentation

## 2015-11-18 DIAGNOSIS — K219 Gastro-esophageal reflux disease without esophagitis: Secondary | ICD-10-CM | POA: Diagnosis not present

## 2015-11-18 LAB — COMPREHENSIVE METABOLIC PANEL
ALT: 13 U/L — ABNORMAL LOW (ref 14–54)
AST: 14 U/L — ABNORMAL LOW (ref 15–41)
Albumin: 3.3 g/dL — ABNORMAL LOW (ref 3.5–5.0)
Alkaline Phosphatase: 47 U/L (ref 38–126)
Anion gap: 6 (ref 5–15)
BUN: 11 mg/dL (ref 6–20)
CO2: 27 mmol/L (ref 22–32)
Calcium: 8.9 mg/dL (ref 8.9–10.3)
Chloride: 110 mmol/L (ref 101–111)
Creatinine, Ser: 0.9 mg/dL (ref 0.44–1.00)
GFR calc Af Amer: >60 mL/min (ref >60)
GFR calc non Af Amer: 53 mL/min — ABNORMAL LOW (ref >60)
Glucose, Bld: 97 mg/dL (ref 65–99)
Potassium: 4 mmol/L (ref 3.5–5.1)
Sodium: 143 mmol/L (ref 135–145)
Total Bilirubin: 0.6 mg/dL (ref 0.3–1.2)
Total Protein: 5.2 g/dL — ABNORMAL LOW (ref 6.5–8.1)

## 2015-11-18 LAB — BRAIN NATRIURETIC PEPTIDE: B Natriuretic Peptide: 263.8 pg/mL — ABNORMAL HIGH (ref 0.0–100.0)

## 2015-11-18 LAB — CBC WITH DIFFERENTIAL/PLATELET
Basophils Absolute: 0 10*3/uL (ref 0.0–0.1)
Basophils Relative: 0 %
Eosinophils Absolute: 0.2 10*3/uL (ref 0.0–0.7)
Eosinophils Relative: 3 %
HCT: 35.8 % — ABNORMAL LOW (ref 36.0–46.0)
Hemoglobin: 11.8 g/dL — ABNORMAL LOW (ref 12.0–15.0)
Lymphocytes Relative: 21 %
Lymphs Abs: 1.3 10*3/uL (ref 0.7–4.0)
MCH: 30.5 pg (ref 26.0–34.0)
MCHC: 33 g/dL (ref 30.0–36.0)
MCV: 92.5 fL (ref 78.0–100.0)
Monocytes Absolute: 0.8 10*3/uL (ref 0.1–1.0)
Monocytes Relative: 12 %
Neutro Abs: 4.1 10*3/uL (ref 1.7–7.7)
Neutrophils Relative %: 64 %
Platelets: 190 10*3/uL (ref 150–400)
RBC: 3.87 MIL/uL (ref 3.87–5.11)
RDW: 14.6 % (ref 11.5–15.5)
WBC: 6.4 10*3/uL (ref 4.0–10.5)

## 2015-11-18 LAB — I-STAT TROPONIN, ED: Troponin i, poc: 0.04 ng/mL (ref 0.00–0.08)

## 2015-11-18 LAB — TROPONIN I
Troponin I: 0.05 ng/mL — ABNORMAL HIGH (ref <0.031)
Troponin I: 0.05 ng/mL — ABNORMAL HIGH (ref <0.031)

## 2015-11-18 LAB — APTT: aPTT: >200 seconds (ref 24–37)

## 2015-11-18 LAB — PROTIME-INR
INR: 1.73 — ABNORMAL HIGH (ref 0.00–1.49)
Prothrombin Time: 20.2 seconds — ABNORMAL HIGH (ref 11.6–15.2)

## 2015-11-18 LAB — HEPARIN LEVEL (UNFRACTIONATED): Heparin Unfractionated: 0.73 IU/mL — ABNORMAL HIGH (ref 0.30–0.70)

## 2015-11-18 LAB — MAGNESIUM: Magnesium: 2.1 mg/dL (ref 1.7–2.4)

## 2015-11-18 MED ORDER — NITROGLYCERIN 0.4 MG SL SUBL
0.4000 mg | SUBLINGUAL_TABLET | SUBLINGUAL | Status: DC | PRN
  Administered 2015-11-19: 0.4 mg via SUBLINGUAL

## 2015-11-18 MED ORDER — NITROGLYCERIN 2 % TD OINT
0.5000 [in_us] | TOPICAL_OINTMENT | Freq: Four times a day (QID) | TRANSDERMAL | Status: DC
  Administered 2015-11-18: 0.5 [in_us] via TOPICAL
  Filled 2015-11-18: qty 1

## 2015-11-18 MED ORDER — NITROGLYCERIN IN D5W 200-5 MCG/ML-% IV SOLN
5.0000 ug/min | INTRAVENOUS | Status: DC
  Administered 2015-11-18: 5 ug/min via INTRAVENOUS
  Filled 2015-11-18: qty 250

## 2015-11-18 MED ORDER — HYDROCORTISONE 2.5 % RE CREA
1.0000 "application " | TOPICAL_CREAM | Freq: Two times a day (BID) | RECTAL | Status: DC | PRN
  Filled 2015-11-18: qty 28.35

## 2015-11-18 MED ORDER — ASPIRIN EC 81 MG PO TBEC: 81.0000 mg | DELAYED_RELEASE_TABLET | Freq: Every morning | ORAL | Status: DC

## 2015-11-18 MED ORDER — CARVEDILOL 6.25 MG PO TABS
6.2500 mg | ORAL_TABLET | Freq: Two times a day (BID) | ORAL | Status: DC
  Administered 2015-11-18 – 2015-11-20 (×4): 6.25 mg via ORAL
  Filled 2015-11-18 (×5): qty 1

## 2015-11-18 MED ORDER — SODIUM CHLORIDE 0.9 % IV BOLUS (SEPSIS)
500.0000 mL | Freq: Once | INTRAVENOUS | Status: AC
  Administered 2015-11-18: 500 mL via INTRAVENOUS

## 2015-11-18 MED ORDER — ASPIRIN 81 MG PO CHEW
324.0000 mg | CHEWABLE_TABLET | Freq: Once | ORAL | Status: AC
  Administered 2015-11-18: 324 mg via ORAL
  Filled 2015-11-18: qty 4

## 2015-11-18 MED ORDER — GABAPENTIN 100 MG PO CAPS
100.0000 mg | ORAL_CAPSULE | Freq: Three times a day (TID) | ORAL | Status: DC
  Administered 2015-11-18 – 2015-11-20 (×7): 100 mg via ORAL
  Filled 2015-11-18 (×7): qty 1

## 2015-11-18 MED ORDER — CLOPIDOGREL BISULFATE 75 MG PO TABS
75.0000 mg | ORAL_TABLET | Freq: Every day | ORAL | Status: DC
  Administered 2015-11-18 – 2015-11-20 (×3): 75 mg via ORAL
  Filled 2015-11-18 (×3): qty 1

## 2015-11-18 MED ORDER — ASPIRIN EC 81 MG PO TBEC
81.0000 mg | DELAYED_RELEASE_TABLET | Freq: Every day | ORAL | Status: DC
  Administered 2015-11-19 – 2015-11-20 (×2): 81 mg via ORAL
  Filled 2015-11-18 (×2): qty 1

## 2015-11-18 MED ORDER — ONDANSETRON HCL 4 MG/2ML IJ SOLN: 4.0000 mg | Freq: Four times a day (QID) | INTRAMUSCULAR | Status: DC | PRN

## 2015-11-18 MED ORDER — ROSUVASTATIN CALCIUM 5 MG PO TABS
5.0000 mg | ORAL_TABLET | Freq: Every day | ORAL | Status: DC
  Administered 2015-11-18 – 2015-11-19 (×2): 5 mg via ORAL
  Filled 2015-11-18 (×3): qty 1

## 2015-11-18 MED ORDER — SODIUM CHLORIDE 0.9 % IV SOLN
INTRAVENOUS | Status: DC
  Administered 2015-11-18: 12:00:00 via INTRAVENOUS

## 2015-11-18 MED ORDER — HEPARIN BOLUS VIA INFUSION
3000.0000 [IU] | Freq: Once | INTRAVENOUS | Status: AC
Start: 2015-11-18 — End: 2015-11-18
  Administered 2015-11-18: 3000 [IU] via INTRAVENOUS
  Filled 2015-11-18: qty 3000

## 2015-11-18 MED ORDER — FOLIC ACID 1 MG PO TABS
1.0000 mg | ORAL_TABLET | Freq: Every day | ORAL | Status: DC
  Administered 2015-11-18 – 2015-11-20 (×3): 1 mg via ORAL
  Filled 2015-11-18 (×3): qty 1

## 2015-11-18 MED ORDER — HEPARIN (PORCINE) IN NACL 100-0.45 UNIT/ML-% IJ SOLN
650.0000 [IU]/h | INTRAMUSCULAR | Status: DC
  Administered 2015-11-18: 700 [IU]/h via INTRAVENOUS
  Filled 2015-11-18: qty 250

## 2015-11-18 MED ORDER — FAMOTIDINE 20 MG PO TABS
20.0000 mg | ORAL_TABLET | Freq: Two times a day (BID) | ORAL | Status: DC
  Administered 2015-11-18 – 2015-11-20 (×5): 20 mg via ORAL
  Filled 2015-11-18 (×5): qty 1

## 2015-11-18 MED ORDER — ACETAMINOPHEN 325 MG PO TABS: 650.0000 mg | ORAL_TABLET | ORAL | Status: DC | PRN

## 2015-11-18 MED ORDER — AMLODIPINE BESYLATE 5 MG PO TABS
5.0000 mg | ORAL_TABLET | Freq: Every day | ORAL | Status: DC
  Administered 2015-11-18 – 2015-11-20 (×3): 5 mg via ORAL
  Filled 2015-11-18 (×3): qty 1

## 2015-11-18 NOTE — H&P (Signed)
[name] is an 82 y.o. female.   Chief Complaint: Chest pain HPI: Patient is 82 year old female with past medical history significant for coronary artery disease history of anteroseptal wall myocardial infarction in the past status post PTCA stenting to LAD in September 2011, subsequently had PTCA stenting to left circumflex in April 2013, hypertension, hyperlipidemia, sinus node dysfunction status post permanent pacemaker in the past, remote history of GI bleeding in the past, history of questionable paroxysmal atrial fibrillation, came to the ER by EMS complaining of retrosternal chest pain radiating to both arms associated with nausea and to 3 sublingual nitroglycerin with partial relief so called EMS EKG done in the ER showed paced rhythm first set of troponin I is negative and denies any shortness of breath. Denies palpitation lightheadedness or syncope. Also complains of vague abdominal pain associated with diarrhea. Denies any fever or chills.  Past Medical History  Diagnosis Date  . Hypertension   . Coronary artery disease   . Myocardial infarction (HCC)     x 3  . Angina     on nitro-dur  . Shortness of breath     with exertion  . GERD (gastroesophageal reflux disease)   . Hyperlipidemia   . Glaucoma   . History of eye surgery     bilateral   . Anemia   . Pacemaker   . H/O myocardial perfusion scan 07/13/2011    The post stress left ventricle is normal in size. the rest left ventricle is normal in size. there is no scintigraphic evidence of inducible myocardial ishemia. The post-stress left ventricular function is normal. the post-stress ejection fraction is 66%.This is a low risk scan.    Past Surgical History  Procedure Laterality Date  . Cardiac catherization  06/2010  . Cardiac catheterization    . Pacemaker insertion  06/2010  . Svd      x 2  . Appendectomy    . Thyroidectomy    . Hysteroscopy w/d&c  09/07/2011    Procedure: DILATATION AND CURETTAGE (D&C)  /HYSTEROSCOPY;  Surgeon: Turner Daniels, MD;  Location: [facility];  Service: Gynecology;  Laterality: N/A;  uterus  . Coronary angioplasty with stent placement  02/13/2012  . Left heart catheterization with coronary angiogram N/A 01/25/2012    Procedure: LEFT HEART CATHETERIZATION WITH CORONARY ANGIOGRAM;  Surgeon: Robynn Pane, MD;  Location: [facility] CATH LAB;  Service: Cardiovascular;  Laterality: N/A;  . Percutaneous coronary stent intervention (pci-s) N/A 02/13/2012    Procedure: PERCUTANEOUS CORONARY STENT INTERVENTION (PCI-S);  Surgeon: Robynn Pane, MD;  Location: [facility] CATH LAB;  Service: Cardiovascular;  Laterality: N/A;  . Left heart catheterization with coronary angiogram N/A 12/18/2014    Procedure: LEFT HEART CATHETERIZATION WITH CORONARY ANGIOGRAM;  Surgeon: Robynn Pane, MD;  Location: [facility] CATH LAB;  Service: Cardiovascular;  Laterality: N/A;  . Cardiac catheterization Left 07/19/2010    LV showed good LV systolic function, LVH EF of 60 % to 65 %. Left main was patent. LAD has 20 % to 30 % proximal stenosis and 99 % mid stenosis with TIMI 2 distal flow. Diagonal-1 was very, very small. Diagonal-2 was very small, Diagonal-3 was small which has 30 % to 40 % ostial stenosis just before the bifurcation with LAD stenosis. Left circumflex has 40 % to 50 % mid stenosis and 70 % to 75 % .    Family History  Problem Relation Age of Onset  . Diabetes Mother   . Cancer Sister  Social History:  reports that she quit smoking about 24 years ago. Her smoking use included Cigarettes. She smoked 0.50 packs per day. She has never used smokeless tobacco. She reports that she does not drink alcohol or use illicit drugs.  Allergies:  Allergies  Allergen Reactions  . Shellfish Allergy     shrimp     (Not in a hospital admission)  Results for orders placed or performed during the hospital encounter of 11/18/15 (from the past 48 hour(s))  I-Stat Troponin, ED - 0, 3, 6 hours (not at [facility])     Status: None    Collection Time: 11/18/15  7:46 AM  Result Value Ref Range   Troponin i, poc 0.04 0.00 - 0.08 ng/mL   Comment 3            Comment: Due to the release kinetics of cTnI, a negative result within the first hours of the onset of symptoms does not rule out myocardial infarction with certainty. If myocardial infarction is still suspected, repeat the test at appropriate intervals.   CBC with Differential     Status: Abnormal   Collection Time: 11/18/15  8:01 AM  Result Value Ref Range   WBC 6.4 4.0 - 10.5 K/uL   RBC 3.87 3.87 - 5.11 MIL/uL   Hemoglobin 11.8 (L) 12.0 - 15.0 g/dL   HCT 40.9 (L) 81.1 - 91.4 %   MCV 92.5 78.0 - 100.0 fL   MCH 30.5 26.0 - 34.0 pg   MCHC 33.0 30.0 - 36.0 g/dL   RDW 78.2 95.6 - 21.3 %   Platelets 190 150 - 400 K/uL   Neutrophils Relative % 64 %   Neutro Abs 4.1 1.7 - 7.7 K/uL   Lymphocytes Relative 21 %   Lymphs Abs 1.3 0.7 - 4.0 K/uL   Monocytes Relative 12 %   Monocytes Absolute 0.8 0.1 - 1.0 K/uL   Eosinophils Relative 3 %   Eosinophils Absolute 0.2 0.0 - 0.7 K/uL   Basophils Relative 0 %   Basophils Absolute 0.0 0.0 - 0.1 K/uL   Dg Chest 2 View  11/18/2015  CLINICAL DATA:  RIGHT-sided chest pain beginning 1 night ago with nausea beginning this morning, history hypertension, coronary artery disease post MI, former smoker EXAM: CHEST  2 VIEW COMPARISON:  None FINDINGS: LEFT subclavian transvenous pacemaker with leads projecting at RIGHT atrium and RIGHT ventricle. Enlargement of cardiac silhouette. Calcified tortuous aorta. Mediastinal contours and pulmonary vascularity otherwise normal. Lungs clear. No pleural effusion or pneumothorax. Bones demineralized. Probable BILATERAL chronic rotator cuff tears. IMPRESSION: Enlargement of cardiac silhouette post pacemaker. No acute abnormalities. Electronically Signed   By: Ulyses Southward M.D.   On: 11/18/2015 08:28    Review of Systems  Constitutional: Negative for fever and chills.  Cardiovascular: Positive for  chest pain. Negative for palpitations, orthopnea and claudication.  Gastrointestinal: Positive for nausea and abdominal pain.  Genitourinary: Positive for dysuria.  Neurological: Negative for dizziness and headaches.    Blood pressure 132/70, pulse 60, temperature 97.9 F (36.6 C), temperature source Oral, resp. rate 17, SpO2 99 %. Physical Exam  Constitutional: She is oriented to person, place, and time.  HENT:  Head: Normocephalic and atraumatic.  Eyes: Conjunctivae are normal. No scleral icterus.  Neck: Normal range of motion. No JVD present. No tracheal deviation present. No thyromegaly present.  Cardiovascular: Normal rate and regular rhythm.   Murmur (Systolic murmur noted no S3 gallop) heard. Respiratory: Effort normal and breath sounds normal. No respiratory  distress. She has no wheezes. She has no rales.  GI: Soft. Bowel sounds are normal. She exhibits no distension. There is no rebound.  Musculoskeletal: She exhibits no edema or tenderness.  Neurological: She is alert and oriented to person, place, and time.     Assessment/Plan Unstable angina rule out MI Coronary artery disease history of anteroseptal wall MI in the past status post PTCA stenting to LAD and left circumflex in the past Hypertension Hyperlipidemia Sinus node dysfunction status post permanent pacemaker History of paroxysmal A. fib in the past patient not the candidate for chronic anticoagulation History of GI bleed in the past Abdominal pain/diarrhea rule out gastroenteritis Plan As per orders Check old records Will schedule for nuclear stress test in a.m. Rinaldo Cloud 11/18/2015, 8:41 AM

## 2015-11-18 NOTE — Progress Notes (Signed)
ANTICOAGULATION CONSULT NOTE - Initial Consult  Pharmacy Consult for heparin Indication: chest pain/ACS  Allergies  Allergen Reactions  . Shellfish Allergy     shrimp    Patient Measurements:     Vital Signs: Temp: 97.9 F (36.6 C) (01/19 0706) Temp Source: Oral (01/19 0706) BP: 133/55 mmHg (01/19 1000) Pulse Rate: 65 (01/19 1000)  Labs:  Recent Labs  11/18/15 0801  HGB 11.8*  HCT 35.8*  PLT 190  CREATININE 0.90    CrCl cannot be calculated (Unknown ideal weight.).   Medical History: Past Medical History  Diagnosis Date  . Hypertension   . Coronary artery disease   . Myocardial infarction (HCC)     x 3  . Angina     on nitro-dur  . Shortness of breath     with exertion  . GERD (gastroesophageal reflux disease)   . Hyperlipidemia   . Glaucoma   . History of eye surgery     bilateral   . Anemia   . Pacemaker   . H/O myocardial perfusion scan 07/13/2011    The post stress left ventricle is normal in size. the rest left ventricle is normal in size. there is no scintigraphic evidence of inducible myocardial ishemia. The post-stress left ventricular function is normal. the post-stress ejection fraction is 66%.This is a low risk scan.    Assessment: 82 yo F to start heparin per pharmacy for [place]/ rule out MI.  H/H 11.8/35.8, pltc 190.  Creat 0.9. Wt from 06/15/15/ = 58.5 kg. Hx PAF but not a candidate for chronic anticoagulation.  To have nuc stress test tomorrow.   Goal of Therapy:  Heparin level 0.3-0.7 units/ml Monitor platelets by anticoagulation protocol: Yes   Plan:  Give 3000 units bolus x 1 Start heparin infusion at 700 units/hr Check anti-Xa level in 8 hours and daily while on heparin Continue to monitor H&H and platelets  Herby Abraham, Pharm.D. [phone] 11/18/2015 10:49 AM

## 2015-11-18 NOTE — Progress Notes (Signed)
Report received from ED at 1735 and pt arrived to the unit at 1810. VSS; telemetry applied and verified with CCMD and NT confirmed. Pt alert and verbally responsive; skin dry and intact with no wounds or pressure ulcer noted. Pt voided upon arrival to the unit; IV intact and transfusing; pt denies any chest pain. Pt granddaughters at bedside; pt and family oriented to the unit and room; call light within reach. Family at bedside; MD notified of pt arrival to the unit. Diet called in for pt. Pt resting comfortably with family and call light within reach. Pt and family educated on fall and safety precaution/prevention. Will closely monitor pt. Dionne Bucy RN

## 2015-11-18 NOTE — Progress Notes (Signed)
ANTICOAGULATION CONSULT NOTE - Consult  Pharmacy Consult for heparin Indication: chest pain/ACS  Allergies  Allergen Reactions  . Shellfish Allergy     shrimp    Patient Measurements: Height:  (149.9 cm) Weight: 122 lb 5.7 oz (55.5 kg) IBW/kg (Calculated) : 43.2  Vital Signs: Temp: 98.3 F (36.8 C) (01/19 1815) Temp Source: Oral (01/19 1815) BP: 159/65 mmHg (01/19 1815) Pulse Rate: 65 (01/19 1815)  Labs:  Recent Labs  11/18/15 0801 11/18/15 1227 11/18/15 1644 11/18/15 2011  HGB 11.8*  --   --   --   HCT 35.8*  --   --   --   PLT 190  --   --   --   APTT  --  >200*  --   --   LABPROT  --  20.2*  --   --   INR  --  1.73*  --   --   HEPARINUNFRC  --   --   --  0.73*  CREATININE 0.90  --   --   --   TROPONINI  --  0.05* 0.05*  --     Estimated Creatinine Clearance: 28.4 mL/min (by C-G formula based on Cr of 0.9).   Medical History: Past Medical History  Diagnosis Date  . Hypertension   . Coronary artery disease   . Myocardial infarction (HCC)     x 3  . Angina     on nitro-dur  . Shortness of breath     with exertion  . GERD (gastroesophageal reflux disease)   . Hyperlipidemia   . Glaucoma   . History of eye surgery     bilateral   . Anemia   . Pacemaker   . H/O myocardial perfusion scan 07/13/2011    The post stress left ventricle is normal in size. the rest left ventricle is normal in size. there is no scintigraphic evidence of inducible myocardial ishemia. The post-stress left ventricular function is normal. the post-stress ejection fraction is 66%.This is a low risk scan.    Assessment: 82 yo F to start heparin per pharmacy for [place]/ rule out MI.  H/H 11.8/35.8, pltc 190.  Creat 0.9. Wt 55.5 kg. Hx PAF but not a candidate for chronic anticoagulation.  To have nuc stress test tomorrow. Heparin level 0.73 on 700 un/hr, will decrease rate  Goal of Therapy:  Heparin level 0.3-0.7 units/ml Monitor platelets by anticoagulation protocol: Yes    Plan:  Decrease heparin infusion to 650 units/hr Check anti-Xa level in 8 hours and daily while on heparin Continue to monitor H&H and platelets  Thank you for allowing [place] to participate in this patients care. Signe Colt, PharmD  11/18/2015 9:23 PM

## 2015-11-18 NOTE — ED Notes (Signed)
Per EMS Pt c/o right sided chest pain that started at 11pm last night; Pt has hx of MI x 1 year ago; pt states nausea at onset but denies SOB; Pt took 3 nitro throughout the night with slight relieve and took another nitro at 5am this morning; pt is from home with her sister; Pt has  Pacemaker; Pt is a&ox 2; Pt c/o of pain at 8/10; pt received one more nitro in route via EMS

## 2015-11-18 NOTE — ED Notes (Signed)
Paged Dr. Harwani to [phone] 

## 2015-11-18 NOTE — ED Notes (Signed)
Notified Dr [name] of pt's troponin 0.05.  Ordered to d/c nitro paste and start nitro drip due to left arm pain presence.

## 2015-11-18 NOTE — ED Notes (Addendum)
Attempted report 

## 2015-11-18 NOTE — ED Notes (Signed)
Notified Dr [name] and Marcelino Duster with pharmacy of pts aPTT >200.  Noted lab was drawn within 25 minutes from starting heparin with bolus.

## 2015-11-18 NOTE — ED Notes (Signed)
Ordered diet tray 

## 2015-11-18 NOTE — ED Provider Notes (Signed)
CSN: [id number]     Arrival date & time 11/18/15  2952 History   First MD Initiated Contact with Patient 11/18/15 [phone]     Chief Complaint  Patient presents with  . Chest Pain     (Consider location/radiation/quality/duration/timing/severity/associated sxs/prior Treatment) Patient is a 82 y.o. female presenting with chest pain.  Chest Pain Pain location:  R chest and L chest Pain quality comment:  "just an awful feeling" Pain radiates to:  L shoulder and R shoulder Pain radiates to the back: no   Pain severity:  Severe Onset quality:  Sudden Duration:  3 hours (started 4AM) Timing:  Constant Progression:  Resolved (CP resolved en route to hospital, some shoulder pain continues) Chronicity:  Recurrent Context: at rest   Relieved by:  Nitroglycerin and aspirin Associated symptoms: nausea and shortness of breath   Associated symptoms: no abdominal pain, no back pain, no cough, no diaphoresis, no fever, no headache and not vomiting   Risk factors: coronary artery disease, high cholesterol and hypertension   Risk factors: no prior DVT/PE     Past Medical History  Diagnosis Date  . Hypertension   . Coronary artery disease   . Myocardial infarction (HCC)     x 3  . Angina     on nitro-dur  . Shortness of breath     with exertion  . GERD (gastroesophageal reflux disease)   . Hyperlipidemia   . Glaucoma   . History of eye surgery     bilateral   . Anemia   . Pacemaker   . H/O myocardial perfusion scan 07/13/2011    The post stress left ventricle is normal in size. the rest left ventricle is normal in size. there is no scintigraphic evidence of inducible myocardial ishemia. The post-stress left ventricular function is normal. the post-stress ejection fraction is 66%.This is a low risk scan.   Past Surgical History  Procedure Laterality Date  . Cardiac catherization  06/2010  . Cardiac catheterization    . Pacemaker insertion  06/2010  . Svd      x 2  . Appendectomy    .  Thyroidectomy    . Hysteroscopy w/d&c  09/07/2011    Procedure: DILATATION AND CURETTAGE (D&C) /HYSTEROSCOPY;  Surgeon: Turner Daniels, MD;  Location: [facility] ORS;  Service: Gynecology;  Laterality: N/A;  uterus  . Coronary angioplasty with stent placement  02/13/2012  . Left heart catheterization with coronary angiogram N/A 01/25/2012    Procedure: LEFT HEART CATHETERIZATION WITH CORONARY ANGIOGRAM;  Surgeon: Robynn Pane, MD;  Location: [facility] CATH LAB;  Service: Cardiovascular;  Laterality: N/A;  . Percutaneous coronary stent intervention (pci-s) N/A 02/13/2012    Procedure: PERCUTANEOUS CORONARY STENT INTERVENTION (PCI-S);  Surgeon: Robynn Pane, MD;  Location: [facility] CATH LAB;  Service: Cardiovascular;  Laterality: N/A;  . Left heart catheterization with coronary angiogram N/A 12/18/2014    Procedure: LEFT HEART CATHETERIZATION WITH CORONARY ANGIOGRAM;  Surgeon: Robynn Pane, MD;  Location: [facility] CATH LAB;  Service: Cardiovascular;  Laterality: N/A;  . Cardiac catheterization Left 07/19/2010    LV showed good LV systolic function, LVH EF of 60 % to 65 %. Left main was patent. LAD has 20 % to 30 % proximal stenosis and 99 % mid stenosis with TIMI 2 distal flow. Diagonal-1 was very, very small. Diagonal-2 was very small, Diagonal-3 was small which has 30 % to 40 % ostial stenosis just before the bifurcation with LAD stenosis. Left circumflex has 40 %  to 50 % mid stenosis and 70 % to 75 % .   Family History  Problem Relation Age of Onset  . Diabetes Mother   . Cancer Sister    Social History  Substance Use Topics  . Smoking status: Former Smoker -- 0.50 packs/day    Types: Cigarettes    Quit date: 09/05/1991  . Smokeless tobacco: Never Used  . Alcohol Use: No     Comment: none since 1992   OB History    No data available     Review of Systems  Constitutional: Negative for fever and diaphoresis.  HENT: Negative for sore throat.   Eyes: Negative for visual disturbance.  Respiratory: Positive for  shortness of breath. Negative for cough.   Cardiovascular: Positive for chest pain.  Gastrointestinal: Positive for nausea and diarrhea. Negative for vomiting and abdominal pain.  Genitourinary: Negative for difficulty urinating.  Musculoskeletal: Negative for back pain and neck pain.  Skin: Negative for rash.  Neurological: Negative for syncope and headaches.      Allergies  Shellfish allergy  Home Medications   Prior to Admission medications   Medication Sig Start Date End Date Taking? Authorizing Provider  amLODipine (NORVASC) 5 MG tablet Take 5 mg by mouth daily.    Historical Provider, MD  aspirin 81 MG EC tablet Take 81 mg by mouth every morning.     Rinaldo Cloud, MD  bimatoprost (LUMIGAN) 0.03 % ophthalmic solution Place 1 drop into the left eye at bedtime.     Historical Provider, MD  carvedilol (COREG) 12.5 MG tablet TAKE 1 TABLET BY MOUTH TWICE DAILY WITH A MEAL 08/31/15   Mihai Croitoru, MD  clopidogrel (PLAVIX) 75 MG tablet Take 75 mg by mouth daily.    Historical Provider, MD  famotidine (PEPCID) 20 MG tablet Take 20 mg by mouth 2 (two) times daily.     Historical Provider, MD  folic acid (FOLVITE) 1 MG tablet Take 1 tablet (1 mg total) by mouth daily. 09/15/15   Mihai Croitoru, MD  furosemide (LASIX) 20 MG tablet Take 20 mg by mouth daily. 05/09/14   Historical Provider, MD  gabapentin (NEURONTIN) 100 MG capsule Take 100 mg by mouth 3 (three) times daily.    Historical Provider, MD  nitroGLYCERIN (NITRODUR - DOSED IN MG/24 HR) 0.2 mg/hr Place 1 patch onto the skin every morning. Apply to chest wall & remove at night    Historical Provider, MD  prednisoLONE acetate (PRED FORTE) 1 % ophthalmic suspension Place 2 drops into the left eye 2 (two) times daily as needed.    Historical Provider, MD  PROCTOZONE-HC 2.5 % rectal cream Apply 1 application topically 2 (two) times daily as needed. 11/09/14   Historical Provider, MD  ramipril (ALTACE) 10 MG capsule Take 10 mg by mouth  daily.     Historical Provider, MD  rosuvastatin (CRESTOR) 5 MG tablet Take 5 mg by mouth daily.    Historical Provider, MD   BP 132/70 mmHg  Pulse 60  Temp(Src) 97.9 F (36.6 C) (Oral)  Resp 17  SpO2 99% Physical Exam  Constitutional: She is oriented to person, place, and time. She appears well-developed. She appears cachectic. No distress.  HENT:  Head: Normocephalic and atraumatic.  Eyes: Conjunctivae and EOM are normal.  Neck: Normal range of motion.  Cardiovascular: Normal rate, regular rhythm, normal heart sounds and intact distal pulses.  Exam reveals no gallop and no friction rub.   No murmur heard. Pulmonary/Chest: Effort normal and breath  sounds normal. No respiratory distress. She has no wheezes. She has no rales. She exhibits tenderness.  Abdominal: Soft. She exhibits no distension. There is no tenderness. There is no guarding.  Musculoskeletal: She exhibits no edema or tenderness.       Right shoulder: She exhibits bony tenderness.       Left shoulder: She exhibits bony tenderness.  Neurological: She is alert and oriented to person, place, and time.  Skin: Skin is warm and dry. No rash noted. She is not diaphoretic. No erythema.  Nursing note and vitals reviewed.   ED Course  Procedures (including critical care time) Labs Review Labs Reviewed  COMPREHENSIVE METABOLIC PANEL - Abnormal; Notable for the following:    Total Protein 5.2 (*)    Albumin 3.3 (*)    AST 14 (*)    ALT 13 (*)    GFR calc non Af Amer 53 (*)    All other components within normal limits  BRAIN NATRIURETIC PEPTIDE - Abnormal; Notable for the following:    B Natriuretic Peptide 263.8 (*)    All other components within normal limits  CBC WITH DIFFERENTIAL/PLATELET - Abnormal; Notable for the following:    Hemoglobin 11.8 (*)    HCT 35.8 (*)    All other components within normal limits  MAGNESIUM  I-STAT TROPOININ, ED    Imaging Review Dg Chest 2 View  11/18/2015  CLINICAL DATA:   RIGHT-sided chest pain beginning 1 night ago with nausea beginning this morning, history hypertension, coronary artery disease post MI, former smoker EXAM: CHEST  2 VIEW COMPARISON:  None FINDINGS: LEFT subclavian transvenous pacemaker with leads projecting at RIGHT atrium and RIGHT ventricle. Enlargement of cardiac silhouette. Calcified tortuous aorta. Mediastinal contours and pulmonary vascularity otherwise normal. Lungs clear. No pleural effusion or pneumothorax. Bones demineralized. Probable BILATERAL chronic rotator cuff tears. IMPRESSION: Enlargement of cardiac silhouette post pacemaker. No acute abnormalities. Electronically Signed   By: Ulyses Southward M.D.   On: 11/18/2015 08:28   I have personally reviewed and evaluated these images and lab results as part of my medical decision-making.   EKG Interpretation   Date/Time:  Thursday November 18 2015 07:05:27 EST Ventricular Rate:  60 PR Interval:  278 QRS Duration: 138 QT Interval:  479 QTC Calculation: 479 R Axis:   -77 Text Interpretation:  Electronic ventricular pacemaker Confirmed by  [facility] MD, Jonathandavid Marlett ([id number]) on 11/18/2015 7:11:04 AM      MDM   Final diagnoses:  Chest pain, unspecified chest pain type  Diarrhea, unspecified type   83yo female with history of CAD with known 70% stenosis in left circumflex, hypertension, hyperlipidemia, sinus node dysfunction s/p dual chamber pacemaker 2011, paroxysmal atrial fibrillation who presents with concern for chest pain and diarrhea.  CP resolved with nitroglycerin prior to arrival to ED, however at home nitro would only temporarily assist symptom before pain returned.  EKG shows paced rhythm.  Troponin negative.  CXR within normal limits.  Consulted patient's cardiologist, Dr. [name] who recommends admission.  Patient reports diarrhea x10 as well, no abdominal pain, benign abdominal exam, suspect viral etiology at this time. Labs WNL.  Given aspirin. No continuing CP at this time. Pt to be  admitted for further care.    Alvira Monday, MD 11/18/15 [phone]

## 2015-11-19 ENCOUNTER — Encounter (HOSPITAL_COMMUNITY): Payer: Medicare Other

## 2015-11-19 ENCOUNTER — Encounter (HOSPITAL_COMMUNITY): Payer: Self-pay | Admitting: General Practice

## 2015-11-19 ENCOUNTER — Observation Stay (HOSPITAL_COMMUNITY): Payer: Medicare Other

## 2015-11-19 LAB — BASIC METABOLIC PANEL
Anion gap: 6 (ref 5–15)
BUN: 7 mg/dL (ref 6–20)
CO2: 24 mmol/L (ref 22–32)
Calcium: 8.6 mg/dL — ABNORMAL LOW (ref 8.9–10.3)
Chloride: 114 mmol/L — ABNORMAL HIGH (ref 101–111)
Creatinine, Ser: 0.72 mg/dL (ref 0.44–1.00)
GFR calc Af Amer: >60 mL/min (ref >60)
GFR calc non Af Amer: >60 mL/min (ref >60)
Glucose, Bld: 90 mg/dL (ref 65–99)
Potassium: 3.6 mmol/L (ref 3.5–5.1)
Sodium: 144 mmol/L (ref 135–145)

## 2015-11-19 LAB — CBC
HCT: 35.5 % — ABNORMAL LOW (ref 36.0–46.0)
Hemoglobin: 12 g/dL (ref 12.0–15.0)
MCH: 30.6 pg (ref 26.0–34.0)
MCHC: 33.8 g/dL (ref 30.0–36.0)
MCV: 90.6 fL (ref 78.0–100.0)
Platelets: 206 10*3/uL (ref 150–400)
RBC: 3.92 MIL/uL (ref 3.87–5.11)
RDW: 14.5 % (ref 11.5–15.5)
WBC: 5.6 10*3/uL (ref 4.0–10.5)

## 2015-11-19 LAB — TROPONIN I: Troponin I: 0.05 ng/mL — ABNORMAL HIGH (ref <0.031)

## 2015-11-19 LAB — LIPID PANEL
Cholesterol: 144 mg/dL (ref 0–200)
HDL: 56 mg/dL (ref >40)
LDL Cholesterol: 81 mg/dL (ref 0–99)
Total CHOL/HDL Ratio: 2.6 RATIO
Triglycerides: 36 mg/dL (ref <150)
VLDL: 7 mg/dL (ref 0–40)

## 2015-11-19 LAB — HEPARIN LEVEL (UNFRACTIONATED)
Heparin Unfractionated: 0.52 IU/mL (ref 0.30–0.70)
Heparin Unfractionated: 0.53 IU/mL (ref 0.30–0.70)

## 2015-11-19 MED ORDER — NITROGLYCERIN 0.4 MG SL SUBL
0.4000 mg | SUBLINGUAL_TABLET | SUBLINGUAL | Status: DC | PRN
Start: 2015-11-19 — End: 2015-11-19

## 2015-11-19 MED ORDER — REGADENOSON 0.4 MG/5ML IV SOLN
0.4000 mg | Freq: Once | INTRAVENOUS | Status: AC
  Administered 2015-11-19: 0.4 mg via INTRAVENOUS
  Filled 2015-11-19: qty 5

## 2015-11-19 MED ORDER — DORZOLAMIDE HCL-TIMOLOL MAL 2-0.5 % OP SOLN
1.0000 [drp] | Freq: Two times a day (BID) | OPHTHALMIC | Status: DC
  Administered 2015-11-19 – 2015-11-20 (×2): 1 [drp] via OPHTHALMIC
  Filled 2015-11-19: qty 10

## 2015-11-19 MED ORDER — ACETAMINOPHEN 325 MG PO TABS
ORAL_TABLET | ORAL | Status: AC
  Filled 2015-11-19: qty 2

## 2015-11-19 MED ORDER — REGADENOSON 0.4 MG/5ML IV SOLN
INTRAVENOUS | Status: AC
  Administered 2015-11-19: 0.4 mg via INTRAVENOUS
  Filled 2015-11-19: qty 5

## 2015-11-19 MED ORDER — DORZOLAMIDE HCL-TIMOLOL MAL 2-0.5 % OP SOLN: 1.0000 [drp] | Freq: Two times a day (BID) | OPHTHALMIC | Status: DC

## 2015-11-19 MED ORDER — TECHNETIUM TC 99M SESTAMIBI GENERIC - CARDIOLITE
10.0000 | Freq: Once | INTRAVENOUS | Status: AC | PRN
  Administered 2015-11-19: 10 via INTRAVENOUS

## 2015-11-19 MED ORDER — NITROGLYCERIN 0.4 MG SL SUBL
SUBLINGUAL_TABLET | SUBLINGUAL | Status: AC
  Filled 2015-11-19: qty 1

## 2015-11-19 MED ORDER — SODIUM CHLORIDE 0.9 % IJ SOLN
80.0000 mg | INTRAVENOUS | Status: AC
  Administered 2015-11-19: 80 mg via INTRAVENOUS
  Filled 2015-11-19: qty 3.2

## 2015-11-19 MED ORDER — TECHNETIUM TC 99M SESTAMIBI GENERIC - CARDIOLITE
30.0000 | Freq: Once | INTRAVENOUS | Status: AC | PRN
  Administered 2015-11-19: 30 via INTRAVENOUS

## 2015-11-19 NOTE — Progress Notes (Addendum)
ANTICOAGULATION CONSULT NOTE - Consult  Pharmacy Consult for heparin Indication: chest pain/ACS  Allergies  Allergen Reactions  . Shellfish Allergy     shrimp    Patient Measurements: Height:  (149.9 cm) Weight: 122 lb 5.7 oz (55.5 kg) IBW/kg (Calculated) : 43.2  Vital Signs: Temp: 98.4 F (36.9 C) (01/20 0341) Temp Source: Oral (01/20 0341) BP: 128/55 mmHg (01/20 0341) Pulse Rate: 64 (01/20 0341)  Labs:  Recent Labs  11/18/15 0801 11/18/15 1227 11/18/15 1644 11/18/15 2011 11/18/15 2220 11/19/15 0635 11/19/15 0637  HGB 11.8*  --   --   --   --   --  12.0  HCT 35.8*  --   --   --   --   --  35.5*  PLT 190  --   --   --   --   --  206  APTT  --  >200*  --   --   --   --   --   LABPROT  --  20.2*  --   --   --   --   --   INR  --  1.73*  --   --   --   --   --   HEPARINUNFRC  --   --   --  0.73*  --  0.52  --   CREATININE 0.90  --   --   --   --   --  0.72  TROPONINI  --  0.05* 0.05*  --  0.05*  --   --     Estimated Creatinine Clearance: 31.9 mL/min (by C-G formula based on Cr of 0.72).   Medical History: Past Medical History  Diagnosis Date  . Hypertension   . Coronary artery disease   . Myocardial infarction (HCC)     x 3  . Angina     on nitro-dur  . Shortness of breath     with exertion  . GERD (gastroesophageal reflux disease)   . Hyperlipidemia   . Glaucoma   . History of eye surgery     bilateral   . Anemia   . Pacemaker   . H/O myocardial perfusion scan 07/13/2011    The post stress left ventricle is normal in size. the rest left ventricle is normal in size. there is no scintigraphic evidence of inducible myocardial ishemia. The post-stress left ventricular function is normal. the post-stress ejection fraction is 66%.This is a low risk scan.    Assessment: 82 yo F to start heparin per pharmacy for UA/ rule out MI. Hx PAF but not a candidate for chronic anticoagulation.  To have nuc stress test this AM HL therapeutic x2 (0.53) x1 on  650 units/h. CBC wnl. No bleed documented.  Goal of Therapy:  Heparin level 0.3-0.7 units/ml Monitor platelets by anticoagulation protocol: Yes   Plan:  Heparin at 650 units/h Daily HL/CBC Mon s/sx bleeding  Babs Bertin, PharmD, [facility] Clinical Pharmacist Pager [phone] 11/19/2015 8:28 AM

## 2015-11-19 NOTE — Progress Notes (Signed)
Subjective:  Patient denies any further chest pain or shortness of breath.  Objective:  Vital Signs in the last 24 hours: Temp:  [97.7 F (36.5 C)-98.4 F (36.9 C)] 98.4 F (36.9 C) (01/20 0341) Pulse Rate:  [55-65] 64 (01/20 0341) Resp:  [16-19] 18 (01/20 0341) BP: (111-165)/(51-86) 138/72 mmHg (01/20 1136) SpO2:  [96 %-100 %] 98 % (01/20 0341) Weight:  [55.5 kg (122 lb 5.7 oz)] 55.5 kg (122 lb 5.7 oz) (01/19 1815)  Intake/Output from previous day: 01/19 0701 - 01/20 0700 In: 240 [P.O.:240] Out: 400 [Urine:400] Intake/Output from this shift:    Physical Exam: Neck: no adenopathy, no carotid bruit, no JVD and supple, symmetrical, trachea midline Lungs: clear to auscultation bilaterally Heart: regular rate and rhythm, S1, S2 normal and soft systolic murmur noted Abdomen: soft, non-tender; bowel sounds normal; no masses,  no organomegaly Extremities: extremities normal, atraumatic, no cyanosis or edema  Lab Results:  Recent Labs  11/18/15 0801 11/19/15 0637  WBC 6.4 5.6  HGB 11.8* 12.0  PLT 190 206    Recent Labs  11/18/15 0801 11/19/15 0637  NA 143 144  K 4.0 3.6  CL 110 114*  CO2 27 24  GLUCOSE 97 90  BUN 11 7  CREATININE 0.90 0.72    Recent Labs  11/18/15 1644 11/18/15 2220  TROPONINI 0.05* 0.05*   Hepatic Function Panel  Recent Labs  11/18/15 0801  PROT 5.2*  ALBUMIN 3.3*  AST 14*  ALT 13*  ALKPHOS 47  BILITOT 0.6    Recent Labs  11/19/15 0633  CHOL 144   No results for input(s): PROTIME in the last 72 hours.  Imaging: Imaging results have been reviewed and Dg Chest 2 View  11/18/2015  CLINICAL DATA:  RIGHT-sided chest pain beginning 1 night ago with nausea beginning this morning, history hypertension, coronary artery disease post MI, former smoker EXAM: CHEST  2 VIEW COMPARISON:  None FINDINGS: LEFT subclavian transvenous pacemaker with leads projecting at RIGHT atrium and RIGHT ventricle. Enlargement of cardiac silhouette. Calcified  tortuous aorta. Mediastinal contours and pulmonary vascularity otherwise normal. Lungs clear. No pleural effusion or pneumothorax. Bones demineralized. Probable BILATERAL chronic rotator cuff tears. IMPRESSION: Enlargement of cardiac silhouette post pacemaker. No acute abnormalities. Electronically Signed   By: Ulyses Southward M.D.   On: 11/18/2015 08:28    Cardiac Studies:  Assessment/Plan:  Unstable angina MI ruled out Coronary artery disease history of anteroseptal wall MI in the past status post PTCA stenting to LAD and left circumflex in the past Hypertension Hyperlipidemia Sinus node dysfunction status post permanent pacemaker History of paroxysmal A. fib in the past patient not the candidate for chronic anticoagulation History of GI bleed in the past Status postAbdominal pain/diarrhea rule out gastroenteritis  plan Continue present management. Schedule for nuclear stress test today   Rinaldo Cloud 11/19/2015, 1:11 PM

## 2015-11-20 LAB — CBC
HCT: 34.5 % — ABNORMAL LOW (ref 36.0–46.0)
Hemoglobin: 11.7 g/dL — ABNORMAL LOW (ref 12.0–15.0)
MCH: 30.8 pg (ref 26.0–34.0)
MCHC: 33.9 g/dL (ref 30.0–36.0)
MCV: 90.8 fL (ref 78.0–100.0)
Platelets: 201 10*3/uL (ref 150–400)
RBC: 3.8 MIL/uL — ABNORMAL LOW (ref 3.87–5.11)
RDW: 14.5 % (ref 11.5–15.5)
WBC: 5.6 10*3/uL (ref 4.0–10.5)

## 2015-11-20 NOTE — Progress Notes (Signed)
Pt. Discharged to home with daughter Pt. D/C'd via wheelchair with NT Discharge information reviewed and given All personal belongings given to Pt.  Education discussed and given to patient/family IV was d/c and intact upon removal Tele d/c

## 2015-11-20 NOTE — Discharge Instructions (Signed)
Angina Pectoris  Angina pectoris, often called angina, is extreme discomfort in the chest, neck, or arm. This is caused by a lack of blood in the middle and thickest layer of the heart wall (myocardium). There are four types of angina:  · Stable angina. Stable angina usually occurs in episodes of predictable frequency and duration. It is usually brought on by physical activity, stress, or excitement. Stable angina usually lasts a few minutes and can often be relieved by a medicine that you place under your tongue. This medicine is called sublingual nitroglycerin.  · Unstable angina. Unstable angina can occur even when you are doing little or no physical activity. It can even occur while you are sleeping or when you are at rest. It can suddenly increase in severity or frequency. It may not be relieved by sublingual nitroglycerin, and it can last up to 30 minutes.  · Microvascular angina. This type of angina is caused by a disorder of tiny blood vessels called arterioles. Microvascular angina is more common in women. The pain may be more severe and last longer than other types of angina pectoris.  · Prinzmetal or variant angina. This type of angina pectoris is rare and usually occurs when you are doing little or no physical activity. It especially occurs in the early morning hours.  CAUSES  Atherosclerosis is the cause of angina. This is the buildup of fat and cholesterol (plaque) on the inside of the arteries. Over time, the plaque may narrow or block the artery, and this will lessen blood flow to the heart. Plaque can also become weak and break off within a coronary artery to form a clot and cause a sudden blockage.  RISK FACTORS  Risk factors common to both men and women include:  · High cholesterol levels.  · High blood pressure (hypertension).  · Tobacco use.  · Diabetes.  · Family history of angina.  · Obesity.  · Lack of exercise.  · A diet high in saturated fats.  Women are at greater risk for angina if they  are:  · Over age 55.  · Postmenopausal.  SYMPTOMS  Many people do not experience any symptoms during the early stages of angina. As the condition progresses, symptoms common to both men and women may include:  · Chest pain.    The pain can be described as a crushing or squeezing in the chest, or a tightness, pressure, fullness, or heaviness in the chest.    The pain can last more than a few minutes, or it can stop and recur.  · Pain in the arms, neck, jaw, or back.  · Unexplained heartburn or indigestion.  · Shortness of breath.  · Nausea.  · Sudden cold sweats.  · Sudden light-headedness.  Many women have chest discomfort and some of the other symptoms. However, women often have different (atypical) symptoms, such as:   · Fatigue.  · Unexplained feelings of nervousness or anxiety.  · Unexplained weakness.  · Dizziness or fainting.  Sometimes, women may have angina without any symptoms.  DIAGNOSIS   Tests to diagnose angina may include:  · ECG (electrocardiogram).  · Exercise stress test. This looks for signs of blockage when the heart is being exercised.  · Pharmacologic stress test. This test looks for signs of blockage when the heart is being stressed with a medicine.  · Blood tests.  · Coronary angiogram. This is a procedure to look at the coronary arteries to see if there is any blockage.    TREATMENT   The treatment of angina may include the following:  · Healthy behavioral changes to reduce or control risk factors.  · Medicine.  · Coronary stenting. A stent helps to keep an artery open.  · Coronary angioplasty. This procedure widens a narrowed or blocked artery.  · Coronary artery bypass surgery. This will allow your blood to pass the blockage (bypass) to reach your heart.  HOME CARE INSTRUCTIONS   · Take medicines only as directed by your health care provider.  · Do not take the following medicines unless your health care provider approves:    Nonsteroidal anti-inflammatory drugs (NSAIDs), such as ibuprofen,  naproxen, or celecoxib.    Vitamin supplements that contain vitamin A, vitamin E, or both.    Hormone replacement therapy that contains estrogen with or without progestin.  · Manage other health conditions such as hypertension and diabetes as directed by your health care provider.  · Follow a heart-healthy diet. A dietitian can help to educate you about healthy food options and changes.  · Use healthy cooking methods such as roasting, grilling, broiling, baking, poaching, steaming, or stir-frying. Talk to a dietitian to learn more about healthy cooking methods.  · Follow an exercise program approved by your health care provider.  · Maintain a healthy weight. Lose weight as approved by your health care provider.  · Plan rest periods when fatigued.  · Learn to manage stress.  · Do not use any tobacco products, including cigarettes, chewing tobacco, or electronic cigarettes. If you need help quitting, ask your health care provider.  · If you drink alcohol, and your health care provider approves, limit your alcohol intake to no more than 1 drink per day. One drink equals 12 ounces of beer, 5 ounces of wine, or 1½ ounces of hard liquor.  · Stop illegal drug use.  · Keep all follow-up visits as directed by your health care provider. This is important.  SEEK IMMEDIATE MEDICAL CARE IF:   · You have pain in your chest, neck, arm, jaw, stomach, or back that lasts more than a few minutes, is recurring, or is unrelieved by taking sublingual nitroglycerin.  · You have profuse sweating without cause.  · You have unexplained:    Heartburn or indigestion.    Shortness of breath or difficulty breathing.    Nausea or vomiting.    Fatigue.    Feelings of nervousness or anxiety.    Weakness.    Diarrhea.  · You have sudden light-headedness or dizziness.  · You faint.  These symptoms may represent a serious problem that is an emergency. Do not wait to see if the symptoms will go away. Get medical help right away. Call your local  emergency services (911 in the [place] Do not drive yourself to the hospital.     This information is not intended to replace advice given to you by your health care provider. Make sure you discuss any questions you have with your health care provider.     Document Released: 10/16/2005 Document Revised: 11/06/2014 Document Reviewed: 02/17/2014  Elsevier Interactive Patient Education ©2016 [employer]

## 2015-11-20 NOTE — Discharge Summary (Signed)
Discharge summary dictated on 11/20/2015 dictation number is [phone]

## 2015-11-20 NOTE — Discharge Summary (Signed)
[name], [name]               ACCOUNT NO.:  [id number]  MEDICAL RECORD NO.:  [id number]  LOCATION:  2W04C                        FACILITY:  [facility]  PHYSICIAN:  Eduardo Osier. [name], M.D. DATE OF BIRTH:  17-Mar-1918  DATE OF ADMISSION:  11/18/2015 DATE OF DISCHARGE:  11/20/2015                              DISCHARGE SUMMARY   ADMITTING DIAGNOSES: 1. Unstable angina, rule out myocardial infarction. 2. Coronary artery disease, history of anteroseptal wall myocardial     infarction in the past status post PTCA stenting to LAD and left     circumflex in the past. 3. Hypertension. 4. Hyperlipidemia. 5. Sinus node dysfunction status post permanent pacemaker. 6. History of paroxysmal atrial fibrillation in the past.  Patient not     the candidate for chronic anticoagulation. 7. History of gastrointestinal bleed in the past. 8. Abdominal pain, diarrhea, rule out gastroenteritis.  FINAL DIAGNOSES: 1. Stable angina, myocardial infarction ruled out.  Negative nuclear     stress test. 2. Coronary artery disease, history of anteroseptal wall myocardial     infarction in the past status post PTCA stenting to LAD and left     circumflex in the past. 3. Hypertension. 4. Hyperlipidemia. 5. Sinus node dysfunction status post permanent pacemaker. 6. History of paroxysmal atrial fibrillation in the past. 7. History of gastrointestinal bleed in the past. 8. Status post gastroenteritis.  DISCHARGE HOME MEDICATIONS:  The patient has been advised to continue with home medications, i.e.: 1. Amlodipine 5 mg 1 tablet daily. 2. Aspirin 81 mg 1 tablet daily. 3. Atorvastatin 10 mg daily. 4. Lumigan 0.03% eye drops as before. 5. Carvedilol 12.5 mg twice daily. 6. Clopidogrel 75 mg daily. 7. Cosopt eye drops as before. 8. Folic acid 1 mg daily. 9. Pepcid 20 mg twice daily. 10.Furosemide 20 mg daily. 11.Gabapentin 100 mg 3 times daily. 12.Nitroglycerin patch 0.2 mg/hour daily. 13.Pred Forte  ophthalmic suspension as before. 14.Ramipril 10 mg 1 capsule daily. 15.Hydrocortisone rectal cream, use as directed as before.  DIET:  Low salt, low cholesterol.  ACTIVITY:  Increase activity as tolerated.  CONDITION AT DISCHARGE:  Stable.  BRIEF HISTORY AND HOSPITAL COURSE:  [name] is a 82 year old female with past medical history significant for coronary artery disease, history of anteroseptal wall myocardial infarction in the past status post PTCA stenting to LAD in September of 2011, subsequently had PTCA stenting to left circumflex in April of 2013, hypertension, hyperlipidemia, sinus node dysfunction status post permanent pacemaker in the past, remote history of gastrointestinal bleed in the past, history of questionable paroxysmal atrial fibrillation, not the candidate for chronic anticoagulation.  She came to the ER by EMS complaining of retrosternal chest pain radiating to both arm, associated with nausea.  Took 3 sublingual nitro with partial relief, so called EMS.  EKG done in the ER showed paced rhythm.  First set of cardiac enzyme was negative.  The patient denies any shortness of breath. Denies palpitation, lightheadedness, or syncope.  Also, complains of vague abdominal pain associated with diarrhea.  Denies any fever or chills.  PHYSICAL EXAMINATION:  GENERAL:  She was alert, awake, oriented, and in no acute distress. VITAL SIGNS:  Blood pressure was 132/70,  pulse 60.  She was afebrile. HEENT:  Conjunctivae were pink. NECK:  Supple.  No JVD.  No bruit. LUNGS:  Clear to auscultation without rhonchi or rales. CARDIOVASCULAR:  S1 and S2 were normal.  There was soft systolic murmur. No S3, gallop. ABDOMEN:  Soft.  Bowel sounds were present.  Nontender. EXTREMITIES:  There was no clubbing, cyanosis, or edema.  LABORATORY DATA:  Sodium was 143, potassium 4, BUN 11, creatinine 0.90. BNP was 263, although patient got fluid challenge of 500 mL in the ED prior to  the labs were drawn.  Hemoglobin was 11.8, hematocrit 35.8. Troponin-I was 0.04, 0.05, 0.05.  Her repeat glucose was 97.  Chest x-ray showed enlargement of the cardiac silhouette post pacemaker. No abnormalities were noted.  Lexiscan Myoview done yesterday showed no evidence of inducible myocardial ischemia or fixed perfusion defect, EF of 56%.  BRIEF HOSPITAL COURSE:  The patient was admitted to telemetry unit.  MI was ruled out by serial enzymes and EKG.  The patient did not have any further episodes of chest pain during the hospital stay.  Her abdominal pain and diarrhea also resolved.  The patient subsequently underwent nuclear stress test as above, which showed no evidence of reversible ischemia with good LV systolic function.  The patient will be discharged home on above medications and will be followed up in my office in 1 week.     Eduardo Osier. [name], M.D.    [facility]  D:  11/20/2015  T:  11/20/2015  Job:  [id number]

## 2015-12-16 ENCOUNTER — Ambulatory Visit (INDEPENDENT_AMBULATORY_CARE_PROVIDER_SITE_OTHER): Payer: Medicare Other | Admitting: *Deleted

## 2015-12-16 DIAGNOSIS — I495 Sick sinus syndrome: Secondary | ICD-10-CM

## 2015-12-16 NOTE — Progress Notes (Signed)
Remote pacemaker transmission.   

## 2016-01-11 LAB — CUP PACEART INCLINIC DEVICE CHECK
Brady Statistic RA Percent Paced: 87 %
Brady Statistic RV Percent Paced: 69 %
Date Time Interrogation Session: 20170314115441
Implantable Lead Implant Date: 20110923
Implantable Lead Implant Date: 20110923
Implantable Lead Location: 753859
Implantable Lead Location: 753860
Lead Channel Impedance Value: 480 Ohm
Lead Channel Impedance Value: 550 Ohm
Lead Channel Pacing Threshold Amplitude: 0.625 V
Lead Channel Pacing Threshold Amplitude: 0.75 V
Lead Channel Pacing Threshold Pulse Width: 0.4 ms
Lead Channel Pacing Threshold Pulse Width: 0.4 ms
Lead Channel Sensing Intrinsic Amplitude: 1.6 mV
Lead Channel Sensing Intrinsic Amplitude: 12 mV
Lead Channel Setting Pacing Amplitude: 0.875
Lead Channel Setting Pacing Amplitude: 2 V
Lead Channel Setting Pacing Pulse Width: 0.4 ms
Lead Channel Setting Sensing Sensitivity: 2 mV
Pulse Gen Model: 2210
Pulse Gen Serial Number: 2561895

## 2016-01-12 ENCOUNTER — Encounter: Payer: Self-pay | Admitting: Cardiology

## 2016-03-16 ENCOUNTER — Ambulatory Visit (INDEPENDENT_AMBULATORY_CARE_PROVIDER_SITE_OTHER): Payer: Medicare Other | Admitting: *Deleted

## 2016-03-16 DIAGNOSIS — I495 Sick sinus syndrome: Secondary | ICD-10-CM

## 2016-03-17 NOTE — Progress Notes (Signed)
Remote pacemaker transmission.   

## 2016-03-24 ENCOUNTER — Emergency Department (HOSPITAL_COMMUNITY): Payer: Medicare Other

## 2016-03-24 ENCOUNTER — Observation Stay (HOSPITAL_COMMUNITY)
Admission: EM | Admit: 2016-03-24 | Discharge: 2016-03-26 | Disposition: A | Discharge status: Home / Self Care | Payer: Medicare Other | Attending: Internal Medicine | Admitting: Internal Medicine

## 2016-03-24 ENCOUNTER — Encounter (HOSPITAL_COMMUNITY): Payer: Self-pay | Admitting: Emergency Medicine

## 2016-03-24 DIAGNOSIS — Z950 Presence of cardiac pacemaker: Secondary | ICD-10-CM | POA: Diagnosis not present

## 2016-03-24 DIAGNOSIS — Z8669 Personal history of other diseases of the nervous system and sense organs: Secondary | ICD-10-CM | POA: Insufficient documentation

## 2016-03-24 DIAGNOSIS — E785 Hyperlipidemia, unspecified: Secondary | ICD-10-CM | POA: Diagnosis not present

## 2016-03-24 DIAGNOSIS — M79602 Pain in left arm: Principal | ICD-10-CM | POA: Insufficient documentation

## 2016-03-24 DIAGNOSIS — I10 Essential (primary) hypertension: Secondary | ICD-10-CM | POA: Diagnosis not present

## 2016-03-24 DIAGNOSIS — Z862 Personal history of diseases of the blood and blood-forming organs and certain disorders involving the immune mechanism: Secondary | ICD-10-CM | POA: Insufficient documentation

## 2016-03-24 DIAGNOSIS — R079 Chest pain, unspecified: Secondary | ICD-10-CM | POA: Diagnosis present

## 2016-03-24 DIAGNOSIS — Z7902 Long term (current) use of antithrombotics/antiplatelets: Secondary | ICD-10-CM | POA: Insufficient documentation

## 2016-03-24 DIAGNOSIS — Z87891 Personal history of nicotine dependence: Secondary | ICD-10-CM | POA: Diagnosis not present

## 2016-03-24 DIAGNOSIS — I2510 Atherosclerotic heart disease of native coronary artery without angina pectoris: Secondary | ICD-10-CM | POA: Diagnosis present

## 2016-03-24 DIAGNOSIS — M75100 Unspecified rotator cuff tear or rupture of unspecified shoulder, not specified as traumatic: Secondary | ICD-10-CM | POA: Diagnosis present

## 2016-03-24 DIAGNOSIS — I25119 Atherosclerotic heart disease of native coronary artery with unspecified angina pectoris: Secondary | ICD-10-CM | POA: Insufficient documentation

## 2016-03-24 DIAGNOSIS — R778 Other specified abnormalities of plasma proteins: Secondary | ICD-10-CM | POA: Diagnosis present

## 2016-03-24 DIAGNOSIS — K219 Gastro-esophageal reflux disease without esophagitis: Secondary | ICD-10-CM | POA: Insufficient documentation

## 2016-03-24 DIAGNOSIS — M25512 Pain in left shoulder: Secondary | ICD-10-CM | POA: Diagnosis present

## 2016-03-24 DIAGNOSIS — R7989 Other specified abnormal findings of blood chemistry: Secondary | ICD-10-CM | POA: Insufficient documentation

## 2016-03-24 DIAGNOSIS — I252 Old myocardial infarction: Secondary | ICD-10-CM | POA: Insufficient documentation

## 2016-03-24 DIAGNOSIS — H9190 Unspecified hearing loss, unspecified ear: Secondary | ICD-10-CM | POA: Insufficient documentation

## 2016-03-24 DIAGNOSIS — Z79899 Other long term (current) drug therapy: Secondary | ICD-10-CM | POA: Diagnosis not present

## 2016-03-24 DIAGNOSIS — M75102 Unspecified rotator cuff tear or rupture of left shoulder, not specified as traumatic: Secondary | ICD-10-CM

## 2016-03-24 DIAGNOSIS — Z7982 Long term (current) use of aspirin: Secondary | ICD-10-CM | POA: Diagnosis not present

## 2016-03-24 HISTORY — DX: Other specified abnormal findings of blood chemistry: R79.89

## 2016-03-24 HISTORY — DX: Other specified abnormalities of plasma proteins: R77.8

## 2016-03-24 HISTORY — DX: Unspecified hearing loss, unspecified ear: H91.90

## 2016-03-24 HISTORY — DX: Unspecified rotator cuff tear or rupture of unspecified shoulder, not specified as traumatic: M75.100

## 2016-03-24 LAB — BASIC METABOLIC PANEL
Anion gap: 6 (ref 5–15)
BUN: 8 mg/dL (ref 6–20)
CO2: 25 mmol/L (ref 22–32)
Calcium: 9.2 mg/dL (ref 8.9–10.3)
Chloride: 112 mmol/L — ABNORMAL HIGH (ref 101–111)
Creatinine, Ser: 0.86 mg/dL (ref 0.44–1.00)
GFR calc Af Amer: >60 mL/min (ref >60)
GFR calc non Af Amer: 56 mL/min — ABNORMAL LOW (ref >60)
Glucose, Bld: 87 mg/dL (ref 65–99)
Potassium: 3.8 mmol/L (ref 3.5–5.1)
Sodium: 143 mmol/L (ref 135–145)

## 2016-03-24 LAB — CBC WITH DIFFERENTIAL/PLATELET
Basophils Absolute: 0 10*3/uL (ref 0.0–0.1)
Basophils Relative: 1 %
Eosinophils Absolute: 0.2 10*3/uL (ref 0.0–0.7)
Eosinophils Relative: 3 %
HCT: 37.5 % (ref 36.0–46.0)
Hemoglobin: 12.4 g/dL (ref 12.0–15.0)
Lymphocytes Relative: 33 %
Lymphs Abs: 1.5 10*3/uL (ref 0.7–4.0)
MCH: 30.2 pg (ref 26.0–34.0)
MCHC: 33.1 g/dL (ref 30.0–36.0)
MCV: 91.5 fL (ref 78.0–100.0)
Monocytes Absolute: 0.5 10*3/uL (ref 0.1–1.0)
Monocytes Relative: 11 %
Neutro Abs: 2.3 10*3/uL (ref 1.7–7.7)
Neutrophils Relative %: 52 %
Platelets: 210 10*3/uL (ref 150–400)
RBC: 4.1 MIL/uL (ref 3.87–5.11)
RDW: 14.3 % (ref 11.5–15.5)
WBC: 4.5 10*3/uL (ref 4.0–10.5)

## 2016-03-24 LAB — LIPID PANEL
Cholesterol: 211 mg/dL — ABNORMAL HIGH (ref 0–200)
HDL: 74 mg/dL (ref >40)
LDL Cholesterol: 125 mg/dL — ABNORMAL HIGH (ref 0–99)
Total CHOL/HDL Ratio: 2.9 RATIO
Triglycerides: 60 mg/dL (ref <150)
VLDL: 12 mg/dL (ref 0–40)

## 2016-03-24 LAB — I-STAT TROPONIN, ED: Troponin i, poc: 0.09 ng/mL (ref 0.00–0.08)

## 2016-03-24 LAB — TROPONIN I
Troponin I: 0.07 ng/mL — ABNORMAL HIGH (ref <0.031)
Troponin I: 0.06 ng/mL — ABNORMAL HIGH (ref <0.031)

## 2016-03-24 LAB — CK: Total CK: 130 U/L (ref 38–234)

## 2016-03-24 MED ORDER — ACETAMINOPHEN 325 MG PO TABS: 650.0000 mg | ORAL_TABLET | ORAL | Status: DC | PRN

## 2016-03-24 MED ORDER — DORZOLAMIDE HCL-TIMOLOL MAL PF 22.3-6.8 MG/ML OP SOLN
1.0000 [drp] | Freq: Two times a day (BID) | OPHTHALMIC | Status: DC
  Administered 2016-03-24 – 2016-03-26 (×4): 1 [drp] via OPHTHALMIC

## 2016-03-24 MED ORDER — ONDANSETRON HCL 4 MG/2ML IJ SOLN
4.0000 mg | Freq: Four times a day (QID) | INTRAMUSCULAR | Status: DC | PRN
  Filled 2016-03-24: qty 2

## 2016-03-24 MED ORDER — ASPIRIN 81 MG PO CHEW
324.0000 mg | CHEWABLE_TABLET | Freq: Once | ORAL | Status: AC
  Administered 2016-03-24: 324 mg via ORAL
  Filled 2016-03-24: qty 4

## 2016-03-24 MED ORDER — AMLODIPINE BESYLATE 5 MG PO TABS
5.0000 mg | ORAL_TABLET | Freq: Every day | ORAL | Status: DC
  Administered 2016-03-24 – 2016-03-26 (×3): 5 mg via ORAL
  Filled 2016-03-24 (×3): qty 1

## 2016-03-24 MED ORDER — GI COCKTAIL ~~LOC~~: 30.0000 mL | Freq: Four times a day (QID) | ORAL | Status: DC | PRN

## 2016-03-24 MED ORDER — ASPIRIN 325 MG PO TABS
325.0000 mg | ORAL_TABLET | Freq: Every day | ORAL | Status: DC
  Administered 2016-03-25: 325 mg via ORAL
  Filled 2016-03-24: qty 1

## 2016-03-24 MED ORDER — GABAPENTIN 100 MG PO CAPS
100.0000 mg | ORAL_CAPSULE | Freq: Three times a day (TID) | ORAL | Status: DC
  Administered 2016-03-24 – 2016-03-26 (×7): 100 mg via ORAL
  Filled 2016-03-24 (×7): qty 1

## 2016-03-24 MED ORDER — DORZOLAMIDE HCL-TIMOLOL MAL 2-0.5 % OP SOLN
1.0000 [drp] | Freq: Two times a day (BID) | OPHTHALMIC | Status: DC
  Filled 2016-03-24: qty 10

## 2016-03-24 MED ORDER — MORPHINE SULFATE (PF) 2 MG/ML IV SOLN: 2.0000 mg | INTRAVENOUS | Status: DC | PRN

## 2016-03-24 MED ORDER — TRAMADOL HCL 50 MG PO TABS
50.0000 mg | ORAL_TABLET | Freq: Two times a day (BID) | ORAL | Status: DC | PRN
  Administered 2016-03-24 – 2016-03-25 (×2): 50 mg via ORAL
  Filled 2016-03-24 (×2): qty 1

## 2016-03-24 MED ORDER — CARVEDILOL 12.5 MG PO TABS
12.5000 mg | ORAL_TABLET | Freq: Two times a day (BID) | ORAL | Status: DC
  Administered 2016-03-24 – 2016-03-26 (×5): 12.5 mg via ORAL
  Filled 2016-03-24 (×5): qty 1

## 2016-03-24 MED ORDER — FAMOTIDINE 20 MG PO TABS
20.0000 mg | ORAL_TABLET | Freq: Two times a day (BID) | ORAL | Status: DC
  Administered 2016-03-24 – 2016-03-26 (×5): 20 mg via ORAL
  Filled 2016-03-24 (×5): qty 1

## 2016-03-24 MED ORDER — ATORVASTATIN CALCIUM 10 MG PO TABS
10.0000 mg | ORAL_TABLET | Freq: Every day | ORAL | Status: DC
  Administered 2016-03-24 – 2016-03-26 (×3): 10 mg via ORAL
  Filled 2016-03-24 (×3): qty 1

## 2016-03-24 MED ORDER — KETOROLAC TROMETHAMINE 15 MG/ML IJ SOLN: 15.0000 mg | Freq: Once | INTRAMUSCULAR | Status: DC

## 2016-03-24 MED ORDER — FOLIC ACID 1 MG PO TABS
1.0000 mg | ORAL_TABLET | Freq: Every day | ORAL | Status: DC
  Administered 2016-03-24 – 2016-03-26 (×3): 1 mg via ORAL
  Filled 2016-03-24 (×3): qty 1

## 2016-03-24 MED ORDER — FUROSEMIDE 20 MG PO TABS
20.0000 mg | ORAL_TABLET | Freq: Every day | ORAL | Status: DC
  Administered 2016-03-24 – 2016-03-26 (×3): 20 mg via ORAL
  Filled 2016-03-24 (×3): qty 1

## 2016-03-24 MED ORDER — RAMIPRIL 10 MG PO CAPS
10.0000 mg | ORAL_CAPSULE | Freq: Every day | ORAL | Status: DC
  Administered 2016-03-24 – 2016-03-26 (×3): 10 mg via ORAL
  Filled 2016-03-24: qty 1
  Filled 2016-03-24: qty 2
  Filled 2016-03-24: qty 1
  Filled 2016-03-24: qty 2
  Filled 2016-03-24: qty 1
  Filled 2016-03-24: qty 2

## 2016-03-24 MED ORDER — CLOPIDOGREL BISULFATE 75 MG PO TABS
75.0000 mg | ORAL_TABLET | Freq: Every day | ORAL | Status: DC
  Administered 2016-03-24 – 2016-03-25 (×2): 75 mg via ORAL
  Filled 2016-03-24 (×2): qty 1

## 2016-03-24 MED ORDER — LATANOPROST 0.005 % OP SOLN
1.0000 [drp] | Freq: Every day | OPHTHALMIC | Status: DC
  Administered 2016-03-24 – 2016-03-25 (×2): 1 [drp] via OPHTHALMIC
  Filled 2016-03-24: qty 2.5

## 2016-03-24 MED ORDER — TRAMADOL HCL 50 MG PO TABS
50.0000 mg | ORAL_TABLET | Freq: Once | ORAL | Status: AC
  Administered 2016-03-24: 50 mg via ORAL
  Filled 2016-03-24: qty 1

## 2016-03-24 NOTE — ED Notes (Signed)
Phlebotomy at the bedside  

## 2016-03-24 NOTE — ED Notes (Signed)
Attempted report 

## 2016-03-24 NOTE — ED Notes (Signed)
Reported troponin 0.09 to Dr. Verdie MosherLiu.

## 2016-03-24 NOTE — ED Notes (Signed)
Xray delayed for blood draw.

## 2016-03-24 NOTE — ED Notes (Signed)
Pt presents from home with PTAR for LEFT arm pain that began last week but worsened today at 2am; woke patient from sleep; pt reports taking tylenol 30 mins PTA without improvement; pt reports arm pain starts at should and ends at fingers; pt denies known injury to limb; pt also reports tongue swelling since taking tylenol; no obvious swelling noted; pt denies sob or throat pain; pt CAOx4 at this time;

## 2016-03-24 NOTE — ED Provider Notes (Signed)
CSN: [id number]     Arrival date & time 03/24/16  0458 History   First MD Initiated Contact with Patient 03/24/16 0522     Chief Complaint  Patient presents with  . Arm Pain  . Oral Swelling    per pt report; no edema noted     (Consider location/radiation/quality/duration/timing/severity/associated sxs/prior Treatment) HPI 82 year old female who presents with left arm pain. She has a history of hypertension, CAD, hyperlipidemia, and pacemaker. States that for the past 3 weeks she has had progressively worsening left arm pain that radiates from the left humerus and shoulder down to the hand. States that it is sharp and cramping in nature. It is worse when she is moving her arm. States that she has been putting off seeing her doctor, but over this past week pain has been getting worse. Took Tylenol tonight, and she began to notice that she had tongue pain associated with taking Tylenol. Although her chief complaint states oral swelling, she states that she feels more pain in her tongue rather than swelling. No difficulty breathing, chest pain, syncope or near syncope, neck pain or back pain, numbness or weakness, recent fall or strenuous activity. Denies any associating swelling or overlying skin changes of the arm. Past Medical History  Diagnosis Date  . Hypertension   . Coronary artery disease   . Myocardial infarction (HCC)     x 3  . Angina     on nitro-dur  . Shortness of breath     with exertion  . GERD (gastroesophageal reflux disease)   . Hyperlipidemia   . Glaucoma   . History of eye surgery     bilateral   . Anemia   . Pacemaker   . H/O myocardial perfusion scan 07/13/2011    The post stress left ventricle is normal in size. the rest left ventricle is normal in size. there is no scintigraphic evidence of inducible myocardial ishemia. The post-stress left ventricular function is normal. the post-stress ejection fraction is 66%.This is a low risk scan.  . Elevated troponin     Past Surgical History  Procedure Laterality Date  . Cardiac catherization  06/2010  . Cardiac catheterization    . Pacemaker insertion  06/2010  . Svd      x 2  . Appendectomy    . Thyroidectomy    . Hysteroscopy w/d&c  09/07/2011    Procedure: DILATATION AND CURETTAGE (D&C) /HYSTEROSCOPY;  Surgeon: Turner Daniels, MD;  Location: [facility];  Service: Gynecology;  Laterality: N/A;  uterus  . Coronary angioplasty with stent placement  02/13/2012  . Left heart catheterization with coronary angiogram N/A 01/25/2012    Procedure: LEFT HEART CATHETERIZATION WITH CORONARY ANGIOGRAM;  Surgeon: Robynn Pane, MD;  Location: [facility] CATH LAB;  Service: Cardiovascular;  Laterality: N/A;  . Percutaneous coronary stent intervention (pci-s) N/A 02/13/2012    Procedure: PERCUTANEOUS CORONARY STENT INTERVENTION (PCI-S);  Surgeon: Robynn Pane, MD;  Location: [facility] CATH LAB;  Service: Cardiovascular;  Laterality: N/A;  . Left heart catheterization with coronary angiogram N/A 12/18/2014    Procedure: LEFT HEART CATHETERIZATION WITH CORONARY ANGIOGRAM;  Surgeon: Robynn Pane, MD;  Location: [facility] CATH LAB;  Service: Cardiovascular;  Laterality: N/A;  . Cardiac catheterization Left 07/19/2010    LV showed good LV systolic function, LVH EF of 60 % to 65 %. Left main was patent. LAD has 20 % to 30 % proximal stenosis and 99 % mid stenosis with TIMI 2 distal flow. Diagonal-1  was very, very small. Diagonal-2 was very small, Diagonal-3 was small which has 30 % to 40 % ostial stenosis just before the bifurcation with LAD stenosis. Left circumflex has 40 % to 50 % mid stenosis and 70 % to 75 % .  Marland Kitchen Insert / replace / remove pacemaker     Family History  Problem Relation Age of Onset  . Diabetes Mother   . Cancer Sister    Social History  Substance Use Topics  . Smoking status: Former Smoker -- 0.50 packs/day    Types: Cigarettes    Quit date: 09/05/1991  . Smokeless tobacco: Never Used  . Alcohol Use: No     Comment:  none since 1992   OB History    No data available     Review of Systems 10/14 systems reviewed and are negative other than those stated in the HPI    Allergies  Shellfish allergy  Home Medications   Prior to Admission medications   Medication Sig Start Date End Date Taking? Authorizing Provider  amLODipine (NORVASC) 5 MG tablet Take 5 mg by mouth daily.    Historical Provider, MD  aspirin 81 MG EC tablet Take 81 mg by mouth every morning.     Rinaldo Cloud, MD  atorvastatin (LIPITOR) 10 MG tablet Take 10 mg by mouth daily.    Historical Provider, MD  bimatoprost (LUMIGAN) 0.03 % ophthalmic solution Place 1 drop into the left eye at bedtime.     Historical Provider, MD  carvedilol (COREG) 12.5 MG tablet TAKE 1 TABLET BY MOUTH TWICE DAILY WITH A MEAL 08/31/15   Mihai Croitoru, MD  clopidogrel (PLAVIX) 75 MG tablet Take 75 mg by mouth daily.    Historical Provider, MD  dorzolamide-timolol (COSOPT) 22.3-6.8 MG/ML ophthalmic solution Place 1 drop into the right eye 2 (two) times daily.    Historical Provider, MD  famotidine (PEPCID) 20 MG tablet Take 20 mg by mouth 2 (two) times daily.     Historical Provider, MD  folic acid (FOLVITE) 1 MG tablet Take 1 tablet (1 mg total) by mouth daily. 09/15/15   Mihai Croitoru, MD  furosemide (LASIX) 20 MG tablet Take 20 mg by mouth daily. 05/09/14   Historical Provider, MD  gabapentin (NEURONTIN) 100 MG capsule Take 100 mg by mouth 3 (three) times daily.    Historical Provider, MD  nitroGLYCERIN (NITRODUR - DOSED IN MG/24 HR) 0.2 mg/hr Place 1 patch onto the skin every morning. Apply to chest wall & remove at night    Historical Provider, MD  prednisoLONE acetate (PRED FORTE) 1 % ophthalmic suspension Place 2 drops into the left eye 2 (two) times daily as needed.    Historical Provider, MD  PROCTOZONE-HC 2.5 % rectal cream Apply 1 application topically 2 (two) times daily as needed. 11/09/14   Historical Provider, MD  ramipril (ALTACE) 10 MG capsule Take  10 mg by mouth daily.     Historical Provider, MD   BP 149/62 mmHg  Pulse 58  Temp(Src) 97.9 F (36.6 C) (Oral)  Resp 15  SpO2 100% Physical Exam Physical Exam  Nursing note and vitals reviewed. Constitutional: elderly woman, non-toxic, and in no acute distress Head: Normocephalic and atraumatic.  Mouth/Throat: Oropharynx is clear and moist.  Neck: Normal range of motion. Neck supple.  Cardiovascular: Normal rate and regular rhythm.   Pulmonary/Chest: Effort normal and breath sounds normal.  Abdominal: Soft. There is no tenderness. There is no rebound and no guarding.  Musculoskeletal: Limited ROM  of the let shoulder due to pain. Tender to palpation along humerus of the LUE.   Neurological: Alert, no facial droop, fluent speech, moves all extremities symmetrically, full strength in bilateral hand grip, sensation to light touch in tact throughout Skin: Skin is warm and dry.  Psychiatric: Cooperative  ED Course  Procedures (including critical care time) Labs Review Labs Reviewed  I-STAT TROPOININ, ED - Abnormal; Notable for the following:    Troponin i, poc 0.09 (*)    All other components within normal limits  CBC WITH DIFFERENTIAL/PLATELET  BASIC METABOLIC PANEL  CK    Imaging Review Dg Shoulder Left  03/24/2016  CLINICAL DATA:  Left-sided shoulder and arm pain for a week. EXAM: LEFT SHOULDER - 2+ VIEW COMPARISON:  None. FINDINGS: High humeral head suggesting chronic rotator cuff tear. Acromioclavicular osteoarthritis with spurring and fragmentation, mild for age. Mild degenerative marginal spurring about the glenohumeral joint. No active findings in the left chest wall. Negative for acute fracture or dislocation. IMPRESSION: 1. No acute finding. 2. High humeral head suggesting chronic rotator cuff tear. 3. Mild osteoarthritis. Electronically Signed   By: Marnee SpringJonathon  Watts M.D.   On: 03/24/2016 07:12   Dg Humerus Left  03/24/2016  CLINICAL DATA:  Shoulder and arm pain EXAM: LEFT  HUMERUS - 2+ VIEW COMPARISON:  None. FINDINGS: Degenerative changes in the right shoulder with loss of subacromial space. Spurring in the [facility] joint. No acute bony abnormality. Specifically, no fracture, subluxation, or dislocation. Soft tissues are intact. IMPRESSION: No acute bony abnormality. Electronically Signed   By: Charlett NoseKevin  Dover M.D.   On: 03/24/2016 07:09   I have personally reviewed and evaluated these images and lab results as part of my medical decision-making.   EKG Interpretation   Date/Time:  Friday Mar 24 2016 05:53:56 EDT Ventricular Rate:  60 PR Interval:  271 QRS Duration: 137 QT Interval:  493 QTC Calculation: 493 R Axis:   -70 Text Interpretation:  Atrial-ventricular dual-paced rhythm No further  analysis attempted due to paced rhythm Baseline wander in lead(s) II III  aVF AV dual-paced rhythm Similar to prior EKG  Confirmed by Dewarren Ledbetter MD, Darrick Greenlaw  [id number]) on 03/24/2016 6:07:13 AM      MDM   Final diagnoses:  Left arm pain  Elevated troponin    With unprovoked left arm pain ongoing for past 2-3 weeks, worsened in the past day. Nontoxic in no acute distress. Vital signs are stable. With reproducible left arm pain with palpation of the upper arm and movement of the shoulder. Suspect musculoskeletal pain, but given age and multiple risk factors and unprovoked history did obtain cardiac workup. EKG shows no acute ischemic changes. Troponin is mildly elevated at 0.09. History not suggestive of ACS, but given elevated trop will admit to observation for full chest pain r/o. Admitted by Dr. Ellender HoseBuriev   Damione Robideau Duo Ethal Gotay, MD 03/24/16 [phone]

## 2016-03-24 NOTE — H&P (Signed)
[facility] History and Physical  Amy FramesRosa L Whetstone ZOX:[id number] DOB: 07/16/1918 DOA: 03/24/2016  Referring physician: Verdie Mosherliu PCP: Lupe Carneyean Deland, MD   Chief Complaint: left shoulder pain/left chest pain  HPI: [name] is a delightful 82 y.o. female  this past medical history that includes coronary artery disease, MI status post PTCA stenting, hypertension, hyperlipidemia, sinus node dysfunction status post pacemaker since emergency department with chief complaint of left shoulder left anterior chest pain. Initial evaluation reveals elevated troponin and a chronic left rotator cuff tear. Being admitted to rule out.  Information is obtained from the patient. She states intermittent left shoulder pain for the last 2-3 weeks. She reports initially she "didn't think anything of it" and it did not keep her from performing her activities of daily living. Last 2-3 days the pain worsened and radiated to her left neck and left anterior chest and down her left arm. She describes the pain as sharp and constant and worse with movement. She reports taking a Tylenol but it made her "lower lip and tongue hurt". Denies any recent fall injury or trauma. She denies palpitations diaphoresis dizziness headache syncope or near-syncope. She denies any shortness of breath lower extremity edema. She denies abdominal pain nausea vomiting diarrhea constipation. She denies fever chills dysuria hematuria frequency or urgency. This morning she was unable to move her arm at all since she came to the emergency department  Emergency department she's afebrile hemodynamically stable and not hypoxic   Review of Systems:  10 point review of systems complete and all systems are negative except as indicated in the history of present illness  Past Medical History  Diagnosis Date  . Hypertension   . Coronary artery disease   . Myocardial infarction (HCC)     x 3  . Angina     on nitro-dur  . Shortness of breath    with exertion  . GERD (gastroesophageal reflux disease)   . Hyperlipidemia   . Glaucoma   . History of eye surgery     bilateral   . Anemia   . Pacemaker   . H/O myocardial perfusion scan 07/13/2011    The post stress left ventricle is normal in size. the rest left ventricle is normal in size. there is no scintigraphic evidence of inducible myocardial ishemia. The post-stress left ventricular function is normal. the post-stress ejection fraction is 66%.This is a low risk scan.  . Elevated troponin   . Rotator cuff tear     chronic   Past Surgical History  Procedure Laterality Date  . Cardiac catherization  06/2010  . Cardiac catheterization    . Pacemaker insertion  06/2010  . Svd      x 2  . Appendectomy    . Thyroidectomy    . Hysteroscopy w/d&c  09/07/2011    Procedure: DILATATION AND CURETTAGE (D&C) /HYSTEROSCOPY;  Surgeon: Turner Danielsavid C Lowe, MD;  Location: [facility];  Service: Gynecology;  Laterality: N/A;  uterus  . Coronary angioplasty with stent placement  02/13/2012  . Left heart catheterization with coronary angiogram N/A 01/25/2012    Procedure: LEFT HEART CATHETERIZATION WITH CORONARY ANGIOGRAM;  Surgeon: Robynn PaneMohan N Harwani, MD;  [facility];  Service: Cardiovascular;  Laterality: N/A;  . Percutaneous coronary stent intervention (pci-s) N/A 02/13/2012    Procedure: PERCUTANEOUS CORONARY STENT INTERVENTION (PCI-S);  Surgeon: Robynn PaneMohan N Harwani, MD;  [facility];  Service: Cardiovascular;  Laterality: N/A;  . Left heart catheterization with coronary angiogram N/A 12/18/2014  Procedure: LEFT HEART CATHETERIZATION WITH CORONARY ANGIOGRAM;  Surgeon: Robynn Pane, MD;  Location: [facility] CATH LAB;  Service: Cardiovascular;  Laterality: N/A;  . Cardiac catheterization Left 07/19/2010    LV showed good LV systolic function, LVH EF of 60 % to 65 %. Left main was patent. LAD has 20 % to 30 % proximal stenosis and 99 % mid stenosis with TIMI 2 distal flow. Diagonal-1 was very, very  small. Diagonal-2 was very small, Diagonal-3 was small which has 30 % to 40 % ostial stenosis just before the bifurcation with LAD stenosis. Left circumflex has 40 % to 50 % mid stenosis and 70 % to 75 % .  Marland Kitchen Insert / replace / remove pacemaker     Social History:  reports that she quit smoking about 24 years ago. Her smoking use included Cigarettes. She smoked 0.50 packs per day. She has never used smokeless tobacco. She reports that she does not drink alcohol or use illicit drugs. She lives at home with her 20 year old sister. She is independent with ADLs. She ambulates independently. He has a daughter who helps with medications and doctor's appointments Allergies  Allergen Reactions  . Shellfish Allergy     shrimp    Family History  Problem Relation Age of Onset  . Diabetes Mother   . Cancer Sister      Prior to Admission medications   Medication Sig Start Date End Date Taking? Authorizing Provider  amLODipine (NORVASC) 5 MG tablet Take 5 mg by mouth daily.    Historical Provider, MD  aspirin 81 MG EC tablet Take 81 mg by mouth every morning.     Rinaldo Cloud, MD  atorvastatin (LIPITOR) 10 MG tablet Take 10 mg by mouth daily.    Historical Provider, MD  bimatoprost (LUMIGAN) 0.03 % ophthalmic solution Place 1 drop into the left eye at bedtime.     Historical Provider, MD  carvedilol (COREG) 12.5 MG tablet TAKE 1 TABLET BY MOUTH TWICE DAILY WITH A MEAL 08/31/15   Mihai Croitoru, MD  clopidogrel (PLAVIX) 75 MG tablet Take 75 mg by mouth daily.    Historical Provider, MD  dorzolamide-timolol (COSOPT) 22.3-6.8 MG/ML ophthalmic solution Place 1 drop into the right eye 2 (two) times daily.    Historical Provider, MD  famotidine (PEPCID) 20 MG tablet Take 20 mg by mouth 2 (two) times daily.     Historical Provider, MD  folic acid (FOLVITE) 1 MG tablet Take 1 tablet (1 mg total) by mouth daily. 09/15/15   Mihai Croitoru, MD  furosemide (LASIX) 20 MG tablet Take 20 mg by mouth daily. 05/09/14    Historical Provider, MD  gabapentin (NEURONTIN) 100 MG capsule Take 100 mg by mouth 3 (three) times daily.    Historical Provider, MD  nitroGLYCERIN (NITRODUR - DOSED IN MG/24 HR) 0.2 mg/hr Place 1 patch onto the skin every morning. Apply to chest wall & remove at night    Historical Provider, MD  prednisoLONE acetate (PRED FORTE) 1 % ophthalmic suspension Place 2 drops into the left eye 2 (two) times daily as needed.    Historical Provider, MD  PROCTOZONE-HC 2.5 % rectal cream Apply 1 application topically 2 (two) times daily as needed. 11/09/14   Historical Provider, MD  ramipril (ALTACE) 10 MG capsule Take 10 mg by mouth daily.     Historical Provider, MD   Physical Exam: Filed Vitals:   03/24/16 0545 03/24/16 0724 03/24/16 0730 03/24/16 0745  BP: 155/98 149/62 147/91 159/69  Pulse: 60 58 59 59  Temp:      TempSrc:      Resp:  15 14 18   SpO2: 100% 100% 98% 100%    Wt Readings from Last 3 Encounters:  11/18/15 55.5 kg (122 lb 5.7 oz)  06/15/15 58.514 kg (129 lb)  12/19/14 62.778 kg (138 lb 6.4 oz)    General:  Appears calm and comfortable, somewhat thin and frail, very hard of hearing Eyes: PERRL, normal lids, irises & conjunctiva ENT: grossly normal hearing, lips & tongue is membranes of her mouth are moist and pink Neck: no LAD, masses or thyromegaly Cardiovascular: RRR, no m/r/g. No LE edema.  Respiratory: CTA bilaterally, no w/r/r. Normal respiratory effort. Abdomen: soft, ntnd positive bowel sounds throughout Skin: no rash or induration seen on limited exam, warm and dry Musculoskeletal: Left shoulder tender to palpation particularly anterior, limited range of motion due to pain particularly abduction. Left arm tender to palpation left radial pulse palpable Psychiatric: grossly normal mood and affect, speech fluent and appropriate Neurologic: grossly non-focal. alert and oriented 3           Labs on Admission:  Basic Metabolic Panel:  Recent Labs Lab 03/24/16 0627    NA 143  K 3.8  CL 112*  CO2 25  GLUCOSE 87  BUN 8  CREATININE 0.86  CALCIUM 9.2   Liver Function Tests: No results for input(s): AST, ALT, ALKPHOS, BILITOT, PROT, ALBUMIN in the last 168 hours. No results for input(s): LIPASE, AMYLASE in the last 168 hours. No results for input(s): AMMONIA in the last 168 hours. CBC:  Recent Labs Lab 03/24/16 0627  WBC 4.5  NEUTROABS 2.3  HGB 12.4  HCT 37.5  MCV 91.5  PLT 210   Cardiac Enzymes:  Recent Labs Lab 03/24/16 0627  CKTOTAL 130    BNP (last 3 results)  Recent Labs  11/18/15 0801  BNP 263.8*    ProBNP (last 3 results) No results for input(s): PROBNP in the last 8760 hours.  CBG: No results for input(s): GLUCAP in the last 168 hours.  Radiological Exams on Admission: Dg Shoulder Left  03/24/2016  CLINICAL DATA:  Left-sided shoulder and arm pain for a week. EXAM: LEFT SHOULDER - 2+ VIEW COMPARISON:  None. FINDINGS: High humeral head suggesting chronic rotator cuff tear. Acromioclavicular osteoarthritis with spurring and fragmentation, mild for age. Mild degenerative marginal spurring about the glenohumeral joint. No active findings in the left chest wall. Negative for acute fracture or dislocation. IMPRESSION: 1. No acute finding. 2. High humeral head suggesting chronic rotator cuff tear. 3. Mild osteoarthritis. Electronically Signed   By: Marnee Spring M.D.   On: 03/24/2016 07:12   Dg Humerus Left  03/24/2016  CLINICAL DATA:  Shoulder and arm pain EXAM: LEFT HUMERUS - 2+ VIEW COMPARISON:  None. FINDINGS: Degenerative changes in the right shoulder with loss of subacromial space. Spurring in the [facility] joint. No acute bony abnormality. Specifically, no fracture, subluxation, or dislocation. Soft tissues are intact. IMPRESSION: No acute bony abnormality. Electronically Signed   By: Charlett Nose M.D.   On: 03/24/2016 07:09    EKG: Independently reviewed. Atrial-ventricular dual-paced rhythm No further analysis attempted due  to paced rhythm Baseline wander in lead(s) II III aVF AV dual-paced rhythm Similar to prior EKG  Assessment/Plan Principal Problem:   Chest pain Active Problems:   CAD (coronary artery disease)   HTN (hypertension)   Pacemaker   Hyperlipidemia   Elevated troponin   Left shoulder pain  Rotator cuff tear  #1. Chest pain at rest. Atypical. Heart score 6. Initial troponin 0.09. Chart review indicates some chronic elevation of her troponin but this level is higher than what appears to be her baseline. EKG without acute changes. Of note she underwent nuclear stress test in January of this year which showed no evidence of reversible ischemia with good LV systolic function. I suspect pain is mostly musculoskeletal related to her chronic left rotator cuff tear but will admit to rule out given her history and bump in troponin -Admit to telemetry -Cycle cardiac enzymes -Serial EKG -analgesia -Lipid panel -Continue aspirin and statin  #2. Left shoulder pain/rotator cuff tear. X-ray of that shoulder reveals a chronic rotator cuff tear. Exam consistent with same. -Toradol -Physical therapy -Outpatient follow-up  #3. Hypertension. Controlled in the emergency department. Home meds include amlodipine, Coreg, Lasix and ramapril -Continue home medications -Monitor closely  #4. CAD. Chart review indicates admitted January of this year with chest pain and MI was ruled out. She underwent a stress test showed no evidence of reversible ischemia with good LV systolic function -Continue home meds   Code Status: full (must indicate code status--if unknown or must be presumed, indicate so) DVT Prophylaxis: Family Communication: none present Disposition Plan: home tomorrow  Time spent: 45 minutes  [facility] M [facility]

## 2016-03-24 NOTE — ED Notes (Signed)
Dr. Lui at the bedside.  

## 2016-03-25 DIAGNOSIS — R0789 Other chest pain: Secondary | ICD-10-CM

## 2016-03-25 DIAGNOSIS — I2510 Atherosclerotic heart disease of native coronary artery without angina pectoris: Secondary | ICD-10-CM

## 2016-03-25 DIAGNOSIS — M25512 Pain in left shoulder: Secondary | ICD-10-CM | POA: Diagnosis not present

## 2016-03-25 MED ORDER — TRAMADOL HCL 50 MG PO TABS
100.0000 mg | ORAL_TABLET | Freq: Four times a day (QID) | ORAL | Status: DC | PRN
  Administered 2016-03-25 – 2016-03-26 (×2): 100 mg via ORAL
  Filled 2016-03-25 (×2): qty 2

## 2016-03-25 NOTE — Progress Notes (Signed)
PROGRESS NOTE    [name]  ZOX:[id number] DOB: 1918-01-26 DOA: 03/24/2016 PCP: Lupe Carney, MD (Confirm with patient/family/NH records and if not entered, this HAS to be entered at [facility] point of entry. "No PCP" if truly none.)   Brief Narrative: 82 y/o female with PMH of HTN, CAD, Sinus Node Dysfunction (PPM) presented with left shoulder, arm pain with radiation to her left chest.   Assessment & Plan:   Principal Problem:   Chest pain Active Problems:   CAD (coronary artery disease)   HTN (hypertension)   Pacemaker   Hyperlipidemia   Elevated troponin   Left shoulder pain   Rotator cuff tear  Atypical chest pain: - admitted to telemetry and serial troponins are minimally elevated.  EKG does not show ischemic changes.  The pain is probably from the rotater cuff tear in the left shoulder.  Cardiology consulted and recommendations given.  Recent stress test does not show any ischemia.  Outpatient follow up with Dr [name].    Hypertension controlled.   Hyperlipidemia: Continue with statin.   rotater cuff tear: Will need follow up with Dr Roda Shutters as outpatient.  Discussed with the patient.  Pain control and physical therapy.     DVT prophylaxis: SCD  Code Status: (Full Family Communication: none at bedside.  Disposition Plan: pending PT eval.    Consultants:   Cardiology.   Procedures:  none   Antimicrobials: none   Subjective: Reports her chest pain is better.   Objective: Filed Vitals:   03/24/16 1507 03/24/16 2047 03/25/16 0517 03/25/16 1449  BP: 138/76 107/50 107/49 127/55  Pulse: 63 61 60 58  Temp: 97.6 F (36.4 C) 98.7 F (37.1 C) 98.4 F (36.9 C) 98.2 F (36.8 C)  TempSrc: Oral Oral  Oral  Resp: Height:      Weight:   54.477 kg (120 lb 1.6 oz)   SpO2: 99% 98% 98% 98%    Intake/Output Summary (Last 24 hours) at 03/25/16 1622 Last data filed at 03/25/16 1500  Gross per 24 hour  Intake    840 ml  Output    750 ml    Net     90 ml   Filed Weights   03/24/16 0950 03/25/16 0517  Weight: 54.613 kg (120 lb 6.4 oz) 54.477 kg (120 lb 1.6 oz)    Examination:  General exam: Appears calm and comfortable  Respiratory system: Clear to auscultation. Respiratory effort normal. Cardiovascular system: S1 & S2 heard, RRR. No JVD, murmurs, rubs, gallops or clicks. No pedal edema. Gastrointestinal system: Abdomen is nondistended, soft and nontender. No organomegaly or masses felt. Normal bowel sounds heard. Central nervous system: Alert and oriented. No focal neurological deficits. Extremities: left shoulder tenderness and unable to lift the arm.  Skin: No rashes, lesions or ulcers Psychiatry: Judgement and insight appear normal. Mood & affect appropriate.     Data Reviewed: I have personally reviewed following labs and imaging studies  CBC:  Recent Labs Lab 03/24/16 0627  WBC 4.5  NEUTROABS 2.3  HGB 12.4  HCT 37.5  MCV 91.5  PLT 210   Basic Metabolic Panel:  Recent Labs Lab 03/24/16 0627  NA 143  K 3.8  CL 112*  CO2 25  GLUCOSE 87  BUN 8  CREATININE 0.86  CALCIUM 9.2   GFR: Estimated Creatinine Clearance: 30.9 mL/min (by C-G formula based on Cr of 0.86). Liver Function Tests: No results for input(s): AST, ALT, ALKPHOS, BILITOT, PROT, ALBUMIN  in the last 168 hours. No results for input(s): LIPASE, AMYLASE in the last 168 hours. No results for input(s): AMMONIA in the last 168 hours. Coagulation Profile: No results for input(s): INR, PROTIME in the last 168 hours. Cardiac Enzymes:  Recent Labs Lab 03/24/16 0627 03/24/16 1215 03/24/16 1750  CKTOTAL 130  --   --   TROPONINI  --  0.07* 0.06*   BNP (last 3 results) No results for input(s): PROBNP in the last 8760 hours. HbA1C: No results for input(s): HGBA1C in the last 72 hours. CBG: No results for input(s): GLUCAP in the last 168 hours. Lipid Profile:  Recent Labs  03/24/16 0627  CHOL 211*  HDL 74  LDLCALC 125*  TRIG  60  CHOLHDL 2.9   Thyroid Function Tests: No results for input(s): TSH, T4TOTAL, FREET4, T3FREE, THYROIDAB in the last 72 hours. Anemia Panel: No results for input(s): VITAMINB12, FOLATE, FERRITIN, TIBC, IRON, RETICCTPCT in the last 72 hours. Sepsis Labs: No results for input(s): PROCALCITON, LATICACIDVEN in the last 168 hours.  No results found for this or any previous visit (from the past 240 hour(s)).       Radiology Studies: Dg Shoulder Left  03/24/2016  CLINICAL DATA:  Left-sided shoulder and arm pain for a week. EXAM: LEFT SHOULDER - 2+ VIEW COMPARISON:  None. FINDINGS: High humeral head suggesting chronic rotator cuff tear. Acromioclavicular osteoarthritis with spurring and fragmentation, mild for age. Mild degenerative marginal spurring about the glenohumeral joint. No active findings in the left chest wall. Negative for acute fracture or dislocation. IMPRESSION: 1. No acute finding. 2. High humeral head suggesting chronic rotator cuff tear. 3. Mild osteoarthritis. Electronically Signed   By: Marnee SpringJonathon  Watts M.D.   On: 03/24/2016 07:12   Dg Humerus Left  03/24/2016  CLINICAL DATA:  Shoulder and arm pain EXAM: LEFT HUMERUS - 2+ VIEW COMPARISON:  None. FINDINGS: Degenerative changes in the right shoulder with loss of subacromial space. Spurring in the [facility] joint. No acute bony abnormality. Specifically, no fracture, subluxation, or dislocation. Soft tissues are intact. IMPRESSION: No acute bony abnormality. Electronically Signed   By: Charlett NoseKevin  Dover M.D.   On: 03/24/2016 07:09        Scheduled Meds: . amLODipine  5 mg Oral Daily  . atorvastatin  10 mg Oral Daily  . carvedilol  12.5 mg Oral BID WC  . Dorzolamide HCl-Timolol Mal PF  1 drop Right Eye BID  . famotidine  20 mg Oral BID  . folic acid  1 mg Oral Daily  . furosemide  20 mg Oral Daily  . gabapentin  100 mg Oral TID  . ketorolac  15 mg Intravenous Once  . latanoprost  1 drop Left Eye QHS  . ramipril  10 mg Oral Daily    Continuous Infusions:       Time spent: 25 minutes.     Kathlen ModyAKULA,Zakyla Tonche, MD [facility] Pager [phone]  If 7PM-7AM, please contact night-coverage [email or web address] Password [email or web address] 03/25/2016, 4:22 PM

## 2016-03-25 NOTE — Evaluation (Addendum)
Physical Therapy Evaluation Patient Details Name: [name] MRN: [id number] DOB: 01/19/1918 Today's Date: 03/25/2016   History of Present Illness  82 year old female with past medical history significant for coronary artery disease history of anteroseptal wall myocardial infarction in the past status post PTCA stenting to LAD in September 2011, subsequently had PTCA stenting to left circumflex in April 2013, hypertension, hyperlipidemia, sinus node dysfunction status post permanent pacemaker in the past, remote history of GI bleeding in the past, history of questionable paroxysmal atrial fibrillation, Seen by Dr [name] 11/16/15 for chest pain in hospital. R/O lexiscan myovue was normal with no ischemia or infarct. Admitted by IM with intermittent left shoulder pain for the last 2-3 weeks. She reports initially she "didn't think anything of it" and it did not keep her from performing her activities of daily living. Last 2-3 days the pain worsened and radiated to her left neck and left anterior chest and down her left arm Pain is clearly orthopedic in nature with partial frozen left shoulder Also complaining of right thumb pain  Clinical Impression  Pt admitted with above diagnosis. Pt currently with functional limitations due to the deficits listed below (see PT Problem List). Pt with some LOB without RW.  With RW, pt maintains safety.  Pt agrees to get and use a RW and 3N1.  HHPT safety eval recommended as well.  Will follow acutely. Pt will benefit from skilled PT to increase their independence and safety with mobility to allow discharge to the venue listed below.      Follow Up Recommendations Home health PT;Supervision/Assistance - 24 hour (safety eval)HHOT, HHaide and HHSW recommended as well.      Equipment Recommendations  Rolling walker with 5" wheels;3in1 (PT)    Recommendations for Other Services       Precautions / Restrictions Precautions Precautions: Fall Restrictions Weight  Bearing Restrictions: No      Mobility  Bed Mobility Overal bed mobility: Independent                Transfers Overall transfer level: Needs assistance Equipment used: None Transfers: Sit to/from Stand Sit to Stand: Min guard         General transfer comment: Pt with fairly good stability for sit to stand. No LOB.  Ambulation/Gait Ambulation/Gait assistance: Min guard Ambulation Distance (Feet): 450 Feet Assistive device: Rolling walker (2 wheeled);None Gait Pattern/deviations: Step-through pattern;Decreased stride length;Trunk flexed;Narrow base of support   Gait velocity interpretation: Below normal speed for age/gender General Gait Details: Pt ambulates without device with good stability unless challenged.  Once challenged, really needs UE support for balance.    Stairs            Wheelchair Mobility    Modified Rankin (Stroke Patients Only)       Balance Overall balance assessment: Needs assistance Sitting-balance support: No upper extremity supported;Feet supported Sitting balance-Leahy Scale: Good     Standing balance support: No upper extremity supported;During functional activity Standing balance-Leahy Scale: Fair Standing balance comment: can stand statically without UE support.              High level balance activites: Head turns;Sudden stops;Turns;Direction changes High Level Balance Comments: Needs min guard assist seocondary to occaasional instability.  Standardized Balance Assessment Standardized Balance Assessment : Dynamic Gait Index   Dynamic Gait Index Level Surface: Normal Change in Gait Speed: Mild Impairment Gait with Horizontal Head Turns: Mild Impairment Gait with Vertical Head Turns: Mild Impairment Gait and Pivot Turn: Mild Impairment  Step Over Obstacle: Moderate Impairment Step Around Obstacles: Normal Steps: Mild Impairment Total Score: 17       Pertinent Vitals/Pain Pain Assessment: No/denies pain  VSS     Home Living Family/patient expects to be discharged to:: Private residence Living Arrangements: Other relatives (sister) Available Help at Discharge: Family;Available PRN/intermittently Type of Home: House Home Access: Level entry     Home Layout: One level Home Equipment: None      Prior Function Level of Independence: Independent with assistive device(s)         Comments: Pt does the cooking and cleaning for sistier; goes to church every Sunday.     Hand Dominance        Extremity/Trunk Assessment   Upper Extremity Assessment: Defer to OT evaluation           Lower Extremity Assessment: Generalized weakness      Cervical / Trunk Assessment: Normal  Communication   Communication: No difficulties  Cognition Arousal/Alertness: Awake/alert Behavior During Therapy: WFL for tasks assessed/performed Overall Cognitive Status: Within Functional Limits for tasks assessed                      General Comments General comments (skin integrity, edema, etc.): Pt reports she needs to get to church and activities tomorrow.  Wants to go home. Pt scored 17/24 on DGI suggesting risk of falls without device.     Exercises        Assessment/Plan    PT Assessment Patient needs continued PT services  PT Diagnosis Generalized weakness   PT Problem List Decreased activity tolerance;Decreased balance;Decreased mobility;Decreased knowledge of use of DME;Decreased safety awareness;Decreased knowledge of precautions;Decreased coordination;Decreased cognition  PT Treatment Interventions DME instruction;Gait training;Therapeutic activities;Functional mobility training;Therapeutic exercise;Balance training;Patient/family education   PT Goals (Current goals can be found in the Care Plan section) Acute Rehab PT Goals Patient Stated Goal: to go home PT Goal Formulation: With patient Time For Goal Achievement: 04/08/16 Potential to Achieve Goals: Good    Frequency Min  3X/week   Barriers to discharge Decreased caregiver support      Co-evaluation               End of Session Equipment Utilized During Treatment: Gait belt Activity Tolerance: Patient limited by fatigue Patient left: in bed;with bed alarm set;with call bell/phone within reach Nurse Communication: Mobility status    Functional Assessment Tool Used: clinical judgment Functional Limitation: Mobility: Walking and moving around Mobility: Walking and Moving Around Current Status ([id number](G8978): At least 1 percent but less than 20 percent impaired, limited or restricted Mobility: Walking and Moving Around Goal Status [phone](G8979): At least 1 percent but less than 20 percent impaired, limited or restricted    Time: 1400-1412 PT Time Calculation (min) (ACUTE ONLY): 12 min   Charges:   PT Evaluation $PT Eval Moderate Complexity: 1 Procedure     PT G Codes:   PT G-Codes **NOT FOR INPATIENT CLASS** Functional Assessment Tool Used: clinical judgment Functional Limitation: Mobility: Walking and moving around Mobility: Walking and Moving Around Current Status (U9811(G8978): At least 1 percent but less than 20 percent impaired, limited or restricted Mobility: Walking and Moving Around Goal Status [phone](G8979): At least 1 percent but less than 20 percent impaired, limited or restricted    Berline LopesWhite, Julio Storr F 03/25/2016, 4:01 PM Harshan Kearley,PT Acute Rehabilitation [phone] (pager)

## 2016-03-25 NOTE — Consult Note (Signed)
CARDIOLOGY CONSULT NOTE       Patient ID: [name] MRN: [id number] DOB/AGE: 80/17/1919 80 y.o.  Admit date: 03/24/2016 Referring Physician:  Blake DivineAkula Primary Physician: Lupe Carneyean Schmuck, MD Primary Cardiologist:  Harwani/ Croituro for pacer Reason for Consultation: Chest pain  Principal Problem:   Chest pain Active Problems:   CAD (coronary artery disease)   HTN (hypertension)   Pacemaker   Hyperlipidemia   Elevated troponin   Left shoulder pain   Rotator cuff tear   HPI:   [age] year old female with past medical history significant for coronary artery disease history of anteroseptal wall myocardial infarction in the past status post PTCA stenting to LAD in September 2011, subsequently had PTCA stenting to left circumflex in April 2013, hypertension, hyperlipidemia, sinus node dysfunction status post permanent pacemaker in the past, remote history of GI bleeding in the past, history of questionable paroxysmal atrial fibrillation,  Seen by Dr Sharyn LullHarwani 11/16/15 for chest pain in hospital. R/O lexiscan myovue was normal with no ischemia or infarct. Admitted by IM with  intermittent left shoulder pain for the last 2-3 weeks. She reports initially she "didn't think anything of it" and it did not keep her from performing her activities of daily living. Last 2-3 days the pain worsened and radiated to her left neck and left anterior chest and down her left arm Pain is clearly orthopedic in nature with partial frozen left shoulder Also complaining of right thumb pain  ROS All other systems reviewed and negative except as noted above  Past Medical History  Diagnosis Date  . Hypertension   . Coronary artery disease   . Myocardial infarction (HCC)     x 3  . Angina     on nitro-dur  . Shortness of breath     with exertion  . GERD (gastroesophageal reflux disease)   . Hyperlipidemia   . Glaucoma   . History of eye surgery     bilateral   . Anemia   . Pacemaker   . H/O myocardial  perfusion scan 07/13/2011    The post stress left ventricle is normal in size. the rest left ventricle is normal in size. there is no scintigraphic evidence of inducible myocardial ishemia. The post-stress left ventricular function is normal. the post-stress ejection fraction is 66%.This is a low risk scan.  . Elevated troponin   . Rotator cuff tear     chronic  . HOH (hard of hearing)     Family History  Problem Relation Age of Onset  . Diabetes Mother   . Cancer Sister     Social History   Social History  . Marital Status: Widowed    Spouse Name: N/A  . Number of Children: 2  . Years of Education: N/A   Occupational History  . retired    Social History Main Topics  . Smoking status: Former Smoker -- 0.50 packs/day    Types: Cigarettes    Quit date: 09/05/1991  . Smokeless tobacco: Never Used  . Alcohol Use: No     Comment: none since 1992  . Drug Use: No  . Sexual Activity: Not Currently   Other Topics Concern  . Not on file   Social History Narrative    Past Surgical History  Procedure Laterality Date  . Cardiac catherization  06/2010  . Cardiac catheterization    . Pacemaker insertion  06/2010  . Svd      x 2  . Appendectomy    . Thyroidectomy    .  Hysteroscopy w/d&c  09/07/2011    Procedure: DILATATION AND CURETTAGE (D&C) /HYSTEROSCOPY;  Surgeon: Turner Daniels, MD;  Location: [facility] ORS;  Service: Gynecology;  Laterality: N/A;  uterus  . Coronary angioplasty with stent placement  02/13/2012  . Left heart catheterization with coronary angiogram N/A 01/25/2012    Procedure: LEFT HEART CATHETERIZATION WITH CORONARY ANGIOGRAM;  Surgeon: Robynn Pane, MD;  Location: [facility] CATH LAB;  Service: Cardiovascular;  Laterality: N/A;  . Percutaneous coronary stent intervention (pci-s) N/A 02/13/2012    Procedure: PERCUTANEOUS CORONARY STENT INTERVENTION (PCI-S);  Surgeon: Robynn Pane, MD;  Location: [facility] CATH LAB;  Service: Cardiovascular;  Laterality: N/A;  . Left heart  catheterization with coronary angiogram N/A 12/18/2014    Procedure: LEFT HEART CATHETERIZATION WITH CORONARY ANGIOGRAM;  Surgeon: Robynn Pane, MD;  Location: [facility] CATH LAB;  Service: Cardiovascular;  Laterality: N/A;  . Cardiac catheterization Left 07/19/2010    LV showed good LV systolic function, LVH EF of 60 % to 65 %. Left main was patent. LAD has 20 % to 30 % proximal stenosis and 99 % mid stenosis with TIMI 2 distal flow. Diagonal-1 was very, very small. Diagonal-2 was very small, Diagonal-3 was small which has 30 % to 40 % ostial stenosis just before the bifurcation with LAD stenosis. Left circumflex has 40 % to 50 % mid stenosis and 70 % to 75 % .  Marland Kitchen Insert / replace / remove pacemaker       . amLODipine  5 mg Oral Daily  . aspirin  325 mg Oral Daily  . atorvastatin  10 mg Oral Daily  . carvedilol  12.5 mg Oral BID WC  . clopidogrel  75 mg Oral Daily  . Dorzolamide HCl-Timolol Mal PF  1 drop Right Eye BID  . famotidine  20 mg Oral BID  . folic acid  1 mg Oral Daily  . furosemide  20 mg Oral Daily  . gabapentin  100 mg Oral TID  . ketorolac  15 mg Intravenous Once  . latanoprost  1 drop Left Eye QHS  . ramipril  10 mg Oral Daily      Physical Exam: Blood pressure 107/49, pulse 60, temperature 98.4 F (36.9 C), temperature source Oral, resp. rate 18, height 5\' 2"  (1.575 m), weight 54.477 kg (120 lb 1.6 oz), SpO2 98 %.    Affect appropriate Frail elderly black female  HEENT: normal Neck supple with no adenopathy JVP normal no bruits no thyromegaly Lungs clear with no wheezing and good diaphragmatic motion Heart:  S1/S2 no murmur, no rub, gallop or click PMI normal Abdomen: benighn, BS positve, no tenderness, no AAA no bruit.  No HSM or HJR Distal pulses intact with no bruits No edema Neuro non-focal Skin warm and dry Pain palpation left shoulder inability to lift over head    Labs:   Lab Results  Component Value Date   WBC 4.5 03/24/2016   HGB 12.4 03/24/2016    HCT 37.5 03/24/2016   MCV 91.5 03/24/2016   PLT 210 03/24/2016    Recent Labs Lab 03/24/16 0627  NA 143  K 3.8  CL 112*  CO2 25  BUN 8  CREATININE 0.86  CALCIUM 9.2  GLUCOSE 87   Lab Results  Component Value Date   CKTOTAL 130 03/24/2016   CKMB 3.9 06/09/2011   TROPONINI 0.06* 03/24/2016    Lab Results  Component Value Date   CHOL 211* 03/24/2016   CHOL 144 11/19/2015   CHOL  07/20/2010    185        ATP III CLASSIFICATION:  <200     mg/dL   Desirable  161-096  mg/dL   Borderline High  >=045    mg/dL   High          Lab Results  Component Value Date   HDL 74 03/24/2016   HDL 56 11/19/2015   HDL 65 07/20/2010   Lab Results  Component Value Date   LDLCALC 125* 03/24/2016   LDLCALC 81 11/19/2015   LDLCALC * 07/20/2010    111        Total Cholesterol/HDL:CHD Risk Coronary Heart Disease Risk Table                     Men   Women  1/2 Average Risk   3.4   3.3  Average Risk       5.0   4.4  2 X Average Risk   9.6   7.1  3 X Average Risk  23.4   11.0        Use the calculated Patient Ratio above and the CHD Risk Table to determine the patient's CHD Risk.        ATP III CLASSIFICATION (LDL):  <100     mg/dL   Optimal  409-811  mg/dL   Near or Above                    Optimal  130-159  mg/dL   Borderline  914-782  mg/dL   High  >956     mg/dL   Very High   Lab Results  Component Value Date   TRIG 60 03/24/2016   TRIG 36 11/19/2015   TRIG 43 07/20/2010   Lab Results  Component Value Date   CHOLHDL 2.9 03/24/2016   CHOLHDL 2.6 11/19/2015   CHOLHDL 2.8 07/20/2010   No results found for: LDLDIRECT    Radiology: Dg Shoulder Left  03/24/2016  CLINICAL DATA:  Left-sided shoulder and arm pain for a week. EXAM: LEFT SHOULDER - 2+ VIEW COMPARISON:  None. FINDINGS: High humeral head suggesting chronic rotator cuff tear. Acromioclavicular osteoarthritis with spurring and fragmentation, mild for age. Mild degenerative marginal spurring about the glenohumeral  joint. No active findings in the left chest wall. Negative for acute fracture or dislocation. IMPRESSION: 1. No acute finding. 2. High humeral head suggesting chronic rotator cuff tear. 3. Mild osteoarthritis. Electronically Signed   By: Marnee Spring M.D.   On: 03/24/2016 07:12   Dg Humerus Left  03/24/2016  CLINICAL DATA:  Shoulder and arm pain EXAM: LEFT HUMERUS - 2+ VIEW COMPARISON:  None. FINDINGS: Degenerative changes in the right shoulder with loss of subacromial space. Spurring in the [facility] joint. No acute bony abnormality. Specifically, no fracture, subluxation, or dislocation. Soft tissues are intact. IMPRESSION: No acute bony abnormality. Electronically Signed   By: Charlett Nose M.D.   On: 03/24/2016 07:09    EKG:   AV pacing with long PR   ASSESSMENT AND PLAN:  Chest Pain: atypical minimal troponin elevation with no evolution.  Myovue 11/16/15 normal with no ischemia  ECG non diagnostic due to pacing. Would not do any  Further cardiac evaluation in house Outpatient f/u with Dr [name]  PPM:  Normal pacer function on telemetry and ECG  Ortho: pain is from chronic rotator cuff tear Needs PT/OT ? Steroid injections would arrange ortho  F/u   Chol:  On statin  Lab Results  Component Value Date   LDLCALC 125* 03/24/2016    HTN:  Well controlled.  Continue current medications and low sodium Dash type diet.     SignedCharlton Haws 03/25/2016, 8:29 AM

## 2016-03-26 DIAGNOSIS — R0789 Other chest pain: Secondary | ICD-10-CM | POA: Diagnosis not present

## 2016-03-26 MED ORDER — GABAPENTIN 100 MG PO CAPS: 100.0000 mg | ORAL_CAPSULE | Freq: Three times a day (TID) | ORAL | Status: AC | PRN

## 2016-03-26 MED ORDER — TRAMADOL HCL 50 MG PO TABS: 100.0000 mg | ORAL_TABLET | Freq: Four times a day (QID) | ORAL | Status: DC | PRN

## 2016-03-26 NOTE — Discharge Summary (Signed)
Physician Discharge Summary  [name] WUJ:[id number] DOB: 07/16/1918 DOA: 03/24/2016  PCP: Lupe Carneyean Mings, MD  Admit date: 03/24/2016 Discharge date: 03/26/2016  Time spent: 25 minutes  Recommendations for Outpatient Follow-up:  1. Follow up with Dr Roda ShuttersXu orthopedics ino ne week.  2. Follow up with Dr Sharyn LullHarwani in one week.    Discharge Diagnoses:  Principal Problem:   Chest pain Active Problems:   CAD (coronary artery disease)   HTN (hypertension)   Pacemaker   Hyperlipidemia   Elevated troponin   Left shoulder pain   Rotator cuff tear   Discharge Condition: improved  Diet recommendation: low sodiumd iet.   Filed Weights   03/24/16 0950 03/25/16 0517 03/26/16 0500  Weight: 54.613 kg (120 lb 6.4 oz) 54.477 kg (120 lb 1.6 oz) 54.341 kg (119 lb 12.8 oz)    History of present illness:  82 y/o female with PMH of HTN, CAD, Sinus Node Dysfunction (PPM) presented with left shoulder, arm pain with radiation to her left chest.   Hospital Course:  Atypical chest pain: - admitted to telemetry and serial troponins are minimally elevated.  EKG does not show ischemic changes.  The pain is probably from the rotater cuff tear in the left shoulder.  Cardiology consulted and recommendations given.  Recent stress test does not show any ischemia.  Outpatient follow up with Dr Sharyn Lullharwani.    Hypertension controlled.   Hyperlipidemia: Continue with statin.   rotater cuff tear: Will need follow up with Dr Roda ShuttersXu as outpatient.  Discussed with the patient.  Pain control and physical therapy.   Procedures:  none  Consultations:  cardiology  Discharge Exam: Filed Vitals:   03/25/16 2131 03/26/16 0500  BP: 114/54 121/59  Pulse: 63 63  Temp: 98.1 F (36.7 C) 98.6 F (37 C)  Resp: 15 22    General: alert comfortable.  Cardiovascular: s1s2 Respiratory: ctab  Discharge Instructions   Discharge Instructions    Diet - low sodium heart healthy    Complete by:  As  directed      Discharge instructions    Complete by:  As directed   Please follow up with Dr Roda ShuttersXu for your rotater cuff tear in the left shoulder as soon as possible.  Please call to make appointment.  Please follow up with PCP in one week.          Current Discharge Medication List    START taking these medications   Details  traMADol (ULTRAM) 50 MG tablet Take 2 tablets (100 mg total) by mouth every 6 (six) hours as needed for moderate pain or severe pain. Qty: 20 tablet, Refills: 0      CONTINUE these medications which have CHANGED   Details  gabapentin (NEURONTIN) 100 MG capsule Take 1 capsule (100 mg total) by mouth 3 (three) times daily as needed (pain). Qty: 90 capsule, Refills: 0      CONTINUE these medications which have NOT CHANGED   Details  acetaminophen (TYLENOL) 500 MG tablet Take 500 mg by mouth every 6 (six) hours as needed (pain).    amLODipine (NORVASC) 5 MG tablet Take 5 mg by mouth daily.    atorvastatin (LIPITOR) 10 MG tablet Take 10 mg by mouth daily.    carvedilol (COREG) 12.5 MG tablet TAKE 1 TABLET BY MOUTH TWICE DAILY WITH A MEAL Qty: 180 tablet, Refills: 2    conjugated estrogens (PREMARIN) vaginal cream Place 1 Applicatorful vaginally 3 (three) times a week. Use at bedtime on Monday,  Wednesday, Friday    Dorzolamide HCl-Timolol Mal PF (COSOPT PF) 22.3-6.8 MG/ML SOLN Place 1 drop into both eyes 2 (two) times daily.    famotidine (PEPCID) 20 MG tablet Take 20 mg by mouth 2 (two) times daily.     furosemide (LASIX) 20 MG tablet Take 20 mg by mouth daily.    hydrocortisone (PROCTOZONE-HC) 2.5 % rectal cream Place 1 application rectally 2 (two) times daily as needed for hemorrhoids or itching.    nitroGLYCERIN (NITROSTAT) 0.4 MG SL tablet Place 0.4 mg under the tongue every 5 (five) minutes as needed for chest pain.    prednisoLONE acetate (PRED FORTE) 1 % ophthalmic suspension Place 1 drop into the right eye daily.     ramipril (ALTACE) 10 MG  capsule Take 10 mg by mouth daily.     Tafluprost (ZIOPTAN) 0.0015 % SOLN Place 1 drop into the right eye at bedtime.    folic acid (FOLVITE) 1 MG tablet Take 1 tablet (1 mg total) by mouth daily. Qty: 90 tablet, Refills: 2      STOP taking these medications     nitroGLYCERIN (NITRODUR - DOSED IN MG/24 HR) 0.4 mg/hr patch      bimatoprost (LUMIGAN) 0.03 % ophthalmic solution        Allergies  Allergen Reactions  . Shellfish Allergy Itching and Rash    shrimp   Follow-up Information    Follow up with Lupe Carney, MD. Schedule an appointment as soon as possible for a visit in 1 week.   Specialty:  Family Medicine   Contact information:   [address] [place] [address] [phone]       Follow up with Cheral Almas, MD On 03/28/2016.   Specialty:  Orthopedic Surgery   Why:  please follow up with Dr Roda Shutters for your Rotater cuff tear,   Contact information:   [address] [place] [address] [phone]       Follow up with [facility].   Why:  Home Health RN, Physical Therapy, aide and social worker   Contact information:   [address] [place] [address] [phone]        The results of significant diagnostics from this hospitalization (including imaging, microbiology, ancillary and laboratory) are listed below for reference.    Significant Diagnostic Studies: Dg Shoulder Left  03/24/2016  CLINICAL DATA:  Left-sided shoulder and arm pain for a week. EXAM: LEFT SHOULDER - 2+ VIEW COMPARISON:  None. FINDINGS: High humeral head suggesting chronic rotator cuff tear. Acromioclavicular osteoarthritis with spurring and fragmentation, mild for age. Mild degenerative marginal spurring about the glenohumeral joint. No active findings in the left chest wall. Negative for acute fracture or dislocation. IMPRESSION: 1. No acute finding. 2. High humeral head suggesting chronic rotator cuff tear. 3. Mild  osteoarthritis. Electronically Signed   By: Marnee Spring M.D.   On: 03/24/2016 07:12   Dg Humerus Left  03/24/2016  CLINICAL DATA:  Shoulder and arm pain EXAM: LEFT HUMERUS - 2+ VIEW COMPARISON:  None. FINDINGS: Degenerative changes in the right shoulder with loss of subacromial space. Spurring in the [facility] joint. No acute bony abnormality. Specifically, no fracture, subluxation, or dislocation. Soft tissues are intact. IMPRESSION: No acute bony abnormality. Electronically Signed   By: Charlett Nose M.D.   On: 03/24/2016 07:09    Microbiology: No results found for this or any previous visit (from the past 240 hour(s)).   Labs: Basic Metabolic  Panel:  Recent Labs Lab 03/24/16 0627  NA 143  K 3.8  CL 112*  CO2 25  GLUCOSE 87  BUN 8  CREATININE 0.86  CALCIUM 9.2   Liver Function Tests: No results for input(s): AST, ALT, ALKPHOS, BILITOT, PROT, ALBUMIN in the last 168 hours. No results for input(s): LIPASE, AMYLASE in the last 168 hours. No results for input(s): AMMONIA in the last 168 hours. CBC:  Recent Labs Lab 03/24/16 0627  WBC 4.5  NEUTROABS 2.3  HGB 12.4  HCT 37.5  MCV 91.5  PLT 210   Cardiac Enzymes:  Recent Labs Lab 03/24/16 0627 03/24/16 1215 03/24/16 1750  CKTOTAL 130  --   --   TROPONINI  --  0.07* 0.06*   BNP: BNP (last 3 results)  Recent Labs  11/18/15 0801  BNP 263.8*    ProBNP (last 3 results) No results for input(s): PROBNP in the last 8760 hours.  CBG: No results for input(s): GLUCAP in the last 168 hours.     SignedKathlen Mody MD.  [facility] 03/26/2016, 11:24 AM

## 2016-03-26 NOTE — Care Management Note (Signed)
Case Management Note  Patient Details  Name: [name] MRN: [id number] Date of Birth: 07/16/1918  Subjective/Objective:   Chest pain                 Action/Plan: Discharge Planning: AVS reviewed:  NCM spoke to pt and gave permission to speak to [name], [name] # [phone]. [facility]. [facility] Pt will need DME for home. Contacted DME rep for Rollator, and 3n1 for home. [facility]. Dtr states pt lives with her elderly sister and she checks on pt. They are independent at home of ADL's.   PCP-Kirchner, L.DEAN MD  Expected Discharge Date:  03/26/2016               Expected Discharge Plan:  Home w Home Health Services  In-House Referral:  NA  Discharge planning Services  CM Consult  Post Acute Care Choice:  Home Health Choice offered to:  Adult Children  DME Arranged:  3-N-1, Walker rolling with seat DME Agency:  [facility]  HH Arranged:  RN, PT, OT, Nurse's Aide, Social Work [facility]  Status of Service:  Completed, signed off  Medicare Important Message Given:    Date Medicare IM Given:    Medicare IM give by:    Date Additional Medicare IM Given:    Additional Medicare Important Message give by:     If discussed at Long Length of Stay Meetings, dates discussed:    Additional Comments:  [name], Amy Duquette Ellen, RN 03/26/2016, 12:10 PM

## 2016-03-26 NOTE — Discharge Instructions (Signed)
Rotator Cuff Injury °Rotator cuff injury is any type of injury to the set of muscles and tendons that make up the stabilizing unit of your shoulder. This unit holds the ball of your upper arm bone (humerus) in the socket of your shoulder blade (scapula).  °CAUSES °Injuries to your rotator cuff most commonly come from sports or activities that cause your arm to be moved repeatedly over your head. Examples of this include throwing, weight lifting, swimming, or racquet sports. Long lasting (chronic) irritation of your rotator cuff can cause soreness and swelling (inflammation), bursitis, and eventual damage to your tendons, such as a tear (rupture). °SIGNS AND SYMPTOMS °Acute rotator cuff tear: °· Sudden tearing sensation followed by severe pain shooting from your upper shoulder down your arm toward your elbow. °· Decreased range of motion of your shoulder because of pain and muscle spasm. °· Severe pain. °· Inability to raise your arm out to the side because of pain and loss of muscle power (large tears). °Chronic rotator cuff tear: °· Pain that usually is worse at night and may interfere with sleep. °· Gradual weakness and decreased shoulder motion as the pain worsens. °· Decreased range of motion. °Rotator cuff tendinitis:  °· Deep ache in your shoulder and the outside upper arm over your shoulder. °· Pain that comes on gradually and becomes worse when lifting your arm to the side or turning it inward. °DIAGNOSIS °Rotator cuff injury is diagnosed through a medical history, physical exam, and imaging exam. The medical history helps determine the type of rotator cuff injury. Your health care provider will look at your injured shoulder, feel the injured area, and ask you to move your shoulder in different positions. X-ray exams typically are done to rule out other causes of shoulder pain, such as fractures. MRI is the exam of choice for the most severe shoulder injuries because the images show muscles and tendons.    °TREATMENT  °Chronic tear: °· Medicine for pain, such as acetaminophen or ibuprofen. °· Physical therapy and range-of-motion exercises may be helpful in maintaining shoulder function and strength. °· Steroid injections into your shoulder joint. °· Surgical repair of the rotator cuff if the injury does not heal with noninvasive treatment. °Acute tear: °· Anti-inflammatory medicines such as ibuprofen and naproxen to help reduce pain and swelling. °· A sling to help support your arm and rest your rotator cuff muscles. Long-term use of a sling is not advised. It may cause significant stiffening of the shoulder joint. °· Surgery may be considered within a few weeks, especially in younger, active people, to return the shoulder to full function. °· Indications for surgical treatment include the following: °¨ Age younger than 60 years. °¨ Rotator cuff tears that are complete. °¨ Physical therapy, rest, and anti-inflammatory medicines have been used for 6-8 weeks, with no improvement. °¨ Employment or sporting activity that requires constant shoulder use. °Tendinitis: °· Anti-inflammatory medicines such as ibuprofen and naproxen to help reduce pain and swelling. °· A sling to help support your arm and rest your rotator cuff muscles. Long-term use of a sling is not advised. It may cause significant stiffening of the shoulder joint. °· Severe tendinitis may require: °¨ Steroid injections into your shoulder joint. °¨ Physical therapy. °¨ Surgery. °HOME CARE INSTRUCTIONS  °· Apply ice to your injury: °¨ Put ice in a plastic bag. °¨ Place a towel between your skin and the bag. °¨ Leave the ice on for 20 minutes, 2-3 times a day. °· If you   have a shoulder immobilizer (sling and straps), wear it until told otherwise by your health care provider. °· You may want to sleep on several pillows or in a recliner at night to lessen swelling and pain. °· Only take over-the-counter or prescription medicines for pain, discomfort, or fever as  directed by your health care provider. °· Do simple hand squeezing exercises with a soft rubber ball to decrease hand swelling. °SEEK MEDICAL CARE IF:  °· Your shoulder pain increases, or new pain or numbness develops in your arm, hand, or fingers. °· Your hand or fingers are colder than your other hand. °SEEK IMMEDIATE MEDICAL CARE IF:  °· Your arm, hand, or fingers are numb or tingling. °· Your arm, hand, or fingers are increasingly swollen and painful, or they turn white or blue. °MAKE SURE YOU: °· Understand these instructions. °· Will watch your condition. °· Will get help right away if you are not doing well or get worse. °  °This information is not intended to replace advice given to you by your health care provider. Make sure you discuss any questions you have with your health care provider. °  °Document Released: 10/13/2000 Document Revised: 10/21/2013 Document Reviewed: 05/28/2013 °Elsevier Interactive Patient Education ©2016 [employer] ° °

## 2016-03-26 NOTE — Progress Notes (Signed)
Pt discharged to home with home health, BSC and rolling walker from home health delivered to pt, discharge instructions and education given to pt, daughter and grand-daughter with verbal understanding received, pts eye drops from home returned, left facility via w/c accompanied by family members, condition stable.  Raymon MuttonGwen Miachel Nardelli RN

## 2016-03-27 ENCOUNTER — Encounter (HOSPITAL_COMMUNITY): Payer: Self-pay

## 2016-03-27 ENCOUNTER — Emergency Department (HOSPITAL_COMMUNITY): Payer: Medicare Other

## 2016-03-27 ENCOUNTER — Emergency Department (HOSPITAL_COMMUNITY)
Admission: EM | Admit: 2016-03-27 | Discharge: 2016-03-27 | Disposition: A | Discharge status: Home / Self Care | Payer: Medicare Other | Attending: Emergency Medicine | Admitting: Emergency Medicine

## 2016-03-27 DIAGNOSIS — Z9861 Coronary angioplasty status: Secondary | ICD-10-CM | POA: Insufficient documentation

## 2016-03-27 DIAGNOSIS — H9190 Unspecified hearing loss, unspecified ear: Secondary | ICD-10-CM | POA: Insufficient documentation

## 2016-03-27 DIAGNOSIS — Z87891 Personal history of nicotine dependence: Secondary | ICD-10-CM | POA: Diagnosis not present

## 2016-03-27 DIAGNOSIS — D649 Anemia, unspecified: Secondary | ICD-10-CM | POA: Insufficient documentation

## 2016-03-27 DIAGNOSIS — Z9889 Other specified postprocedural states: Secondary | ICD-10-CM | POA: Insufficient documentation

## 2016-03-27 DIAGNOSIS — M25512 Pain in left shoulder: Secondary | ICD-10-CM | POA: Diagnosis present

## 2016-03-27 DIAGNOSIS — Z8739 Personal history of other diseases of the musculoskeletal system and connective tissue: Secondary | ICD-10-CM | POA: Insufficient documentation

## 2016-03-27 DIAGNOSIS — I10 Essential (primary) hypertension: Secondary | ICD-10-CM | POA: Insufficient documentation

## 2016-03-27 DIAGNOSIS — Z79899 Other long term (current) drug therapy: Secondary | ICD-10-CM | POA: Insufficient documentation

## 2016-03-27 DIAGNOSIS — R112 Nausea with vomiting, unspecified: Secondary | ICD-10-CM | POA: Diagnosis not present

## 2016-03-27 DIAGNOSIS — Z7952 Long term (current) use of systemic steroids: Secondary | ICD-10-CM | POA: Insufficient documentation

## 2016-03-27 DIAGNOSIS — I25119 Atherosclerotic heart disease of native coronary artery with unspecified angina pectoris: Secondary | ICD-10-CM | POA: Diagnosis not present

## 2016-03-27 DIAGNOSIS — E785 Hyperlipidemia, unspecified: Secondary | ICD-10-CM | POA: Diagnosis not present

## 2016-03-27 DIAGNOSIS — H409 Unspecified glaucoma: Secondary | ICD-10-CM | POA: Diagnosis not present

## 2016-03-27 DIAGNOSIS — Z950 Presence of cardiac pacemaker: Secondary | ICD-10-CM | POA: Diagnosis not present

## 2016-03-27 DIAGNOSIS — K219 Gastro-esophageal reflux disease without esophagitis: Secondary | ICD-10-CM | POA: Diagnosis not present

## 2016-03-27 LAB — CBC WITH DIFFERENTIAL/PLATELET
Basophils Absolute: 0 10*3/uL (ref 0.0–0.1)
Basophils Relative: 0 %
Eosinophils Absolute: 0.2 10*3/uL (ref 0.0–0.7)
Eosinophils Relative: 3 %
HCT: 34.5 % — ABNORMAL LOW (ref 36.0–46.0)
Hemoglobin: 11.6 g/dL — ABNORMAL LOW (ref 12.0–15.0)
Lymphocytes Relative: 28 %
Lymphs Abs: 1.6 10*3/uL (ref 0.7–4.0)
MCH: 29.6 pg (ref 26.0–34.0)
MCHC: 33.6 g/dL (ref 30.0–36.0)
MCV: 88 fL (ref 78.0–100.0)
Monocytes Absolute: 1 10*3/uL (ref 0.1–1.0)
Monocytes Relative: 17 %
Neutro Abs: 3 10*3/uL (ref 1.7–7.7)
Neutrophils Relative %: 52 %
Platelets: 186 10*3/uL (ref 150–400)
RBC: 3.92 MIL/uL (ref 3.87–5.11)
RDW: 13.8 % (ref 11.5–15.5)
WBC: 5.8 10*3/uL (ref 4.0–10.5)

## 2016-03-27 LAB — COMPREHENSIVE METABOLIC PANEL
ALT: 12 U/L — ABNORMAL LOW (ref 14–54)
AST: 18 U/L (ref 15–41)
Albumin: 3.6 g/dL (ref 3.5–5.0)
Alkaline Phosphatase: 50 U/L (ref 38–126)
Anion gap: 6 (ref 5–15)
BUN: 12 mg/dL (ref 6–20)
CO2: 26 mmol/L (ref 22–32)
Calcium: 9.1 mg/dL (ref 8.9–10.3)
Chloride: 107 mmol/L (ref 101–111)
Creatinine, Ser: 1.01 mg/dL — ABNORMAL HIGH (ref 0.44–1.00)
GFR calc Af Amer: 53 mL/min — ABNORMAL LOW (ref >60)
GFR calc non Af Amer: 46 mL/min — ABNORMAL LOW (ref >60)
Glucose, Bld: 108 mg/dL — ABNORMAL HIGH (ref 65–99)
Potassium: 3.3 mmol/L — ABNORMAL LOW (ref 3.5–5.1)
Sodium: 139 mmol/L (ref 135–145)
Total Bilirubin: 0.5 mg/dL (ref 0.3–1.2)
Total Protein: 5.9 g/dL — ABNORMAL LOW (ref 6.5–8.1)

## 2016-03-27 LAB — I-STAT TROPONIN, ED: Troponin i, poc: 0.04 ng/mL (ref 0.00–0.08)

## 2016-03-27 MED ORDER — SODIUM CHLORIDE 0.9 % IV SOLN
INTRAVENOUS | Status: DC
  Administered 2016-03-27: 23:00:00 via INTRAVENOUS

## 2016-03-27 MED ORDER — ONDANSETRON 4 MG PO TBDP: 4.0000 mg | ORAL_TABLET | Freq: Three times a day (TID) | ORAL | Status: DC | PRN

## 2016-03-27 MED ORDER — SODIUM CHLORIDE 0.9 % IV BOLUS (SEPSIS)
250.0000 mL | Freq: Once | INTRAVENOUS | Status: AC
  Administered 2016-03-27: 250 mL via INTRAVENOUS

## 2016-03-27 MED ORDER — ONDANSETRON HCL 4 MG/2ML IJ SOLN
4.0000 mg | Freq: Once | INTRAMUSCULAR | Status: AC
  Administered 2016-03-27: 4 mg via INTRAVENOUS
  Filled 2016-03-27: qty 2

## 2016-03-27 MED ORDER — SODIUM CHLORIDE 0.9 % IV SOLN: INTRAVENOUS | Status: DC

## 2016-03-27 NOTE — ED Notes (Signed)
[facility]- pt coming from home with c/o left shoulder pain as well as nausea and vomiting. Pt was d/c from the hospital yesterday and reports n/v started prior to discharge. Vitals stable with EMS. Pt received 4mg  of zofran en route.

## 2016-03-27 NOTE — ED Notes (Signed)
Pt had a run of 6 PVC's. Strip printed off and given to MD. Pt asymptomatic.

## 2016-03-27 NOTE — Discharge Instructions (Signed)
Follow-up with her orthopedic doctor for the left shoulder. Wear the sling as needed for comfort. Take Zofran for the nausea and vomiting. Suspect that the nausea and vomiting may been secondary to the tramadol. Return for any new or worse symptoms.

## 2016-03-27 NOTE — Progress Notes (Signed)
Orthopedic Tech Progress Note Patient Details:  Kathalene FramesRosa L Nunnelley 07/16/1918 [id number]  Ortho Devices Type of Ortho Device: Sling immobilizer Ortho Device/Splint Location: lue Ortho Device/Splint Interventions: Ordered, Application   Trinna PostMartinez, Eliseo Withers J 03/27/2016, 11:25 PM

## 2016-03-27 NOTE — ED Notes (Signed)
Ortho tech at bedside 

## 2016-03-27 NOTE — ED Provider Notes (Signed)
CSN: [id number]     Arrival date & time 03/27/16  1727 History   First MD Initiated Contact with Patient 03/27/16 1911     Chief Complaint  Patient presents with  . Nausea  . Emesis  . Shoulder Pain     (Consider location/radiation/quality/duration/timing/severity/associated sxs/prior Treatment) Patient is a 82 y.o. female presenting with vomiting and shoulder pain. The history is provided by the patient and a relative.  Emesis Associated symptoms: no abdominal pain, no diarrhea and no headaches   Shoulder Pain Associated symptoms: no back pain and no fever   82 year old female with a complaint of left shoulder pain. And several episodes of vomiting. Following taking tramadol. Patient brought in by EMS. Patient was just admitted to the hospital on May 26 and discharged home on May 28. Patient's had complain of left shoulder pain for a long period of time. An eyelid primary care doctor and felt to be due to rotator cuff injury has referral to orthopedics. Patient with no complaint of anterior chest pain. No belly pain.   Past Medical History  Diagnosis Date  . Hypertension   . Coronary artery disease   . Myocardial infarction (HCC)     x 3  . Angina     on nitro-dur  . Shortness of breath     with exertion  . GERD (gastroesophageal reflux disease)   . Hyperlipidemia   . Glaucoma   . History of eye surgery     bilateral   . Anemia   . Pacemaker   . H/O myocardial perfusion scan 07/13/2011    The post stress left ventricle is normal in size. the rest left ventricle is normal in size. there is no scintigraphic evidence of inducible myocardial ishemia. The post-stress left ventricular function is normal. the post-stress ejection fraction is 66%.This is a low risk scan.  . Elevated troponin   . Rotator cuff tear     chronic  . HOH (hard of hearing)   chest pain was  Past Surgical History  Procedure Laterality Date  . Cardiac catherization  06/2010  . Cardiac catheterization     . Pacemaker insertion  06/2010  . Svd      x 2  . Appendectomy    . Thyroidectomy    . Hysteroscopy w/d&c  09/07/2011    Procedure: DILATATION AND CURETTAGE (D&C) /HYSTEROSCOPY;  Surgeon: Turner Danielsavid C Lowe, MD;  Location: [facility];  Service: Gynecology;  Laterality: N/A;  uterus  . Coronary angioplasty with stent placement  02/13/2012  . Left heart catheterization with coronary angiogram N/A 01/25/2012    Procedure: LEFT HEART CATHETERIZATION WITH CORONARY ANGIOGRAM;  Surgeon: Robynn PaneMohan N Harwani, MD;  [facility];  Service: Cardiovascular;  Laterality: N/A;  . Percutaneous coronary stent intervention (pci-s) N/A 02/13/2012    Procedure: PERCUTANEOUS CORONARY STENT INTERVENTION (PCI-S);  Surgeon: Robynn PaneMohan N Harwani, MD;  [facility];  Service: Cardiovascular;  Laterality: N/A;  . Left heart catheterization with coronary angiogram N/A 12/18/2014    Procedure: LEFT HEART CATHETERIZATION WITH CORONARY ANGIOGRAM;  Surgeon: Robynn PaneMohan N Harwani, MD;  [facility];  Service: Cardiovascular;  Laterality: N/A;  . Cardiac catheterization Left 07/19/2010    LV showed good LV systolic function, LVH EF of 60 % to 65 %. Left main was patent. LAD has 20 % to 30 % proximal stenosis and 99 % mid stenosis with TIMI 2 distal flow. Diagonal-1 was very, very small. Diagonal-2 was very small, Diagonal-3 was small  which has 30 % to 40 % ostial stenosis just before the bifurcation with LAD stenosis. Left circumflex has 40 % to 50 % mid stenosis and 70 % to 75 % .  Marland Kitchen Insert / replace / remove pacemaker     Family History  Problem Relation Age of Onset  . Diabetes Mother   . Cancer Sister    Social History  Substance Use Topics  . Smoking status: Former Smoker -- 0.50 packs/day    Types: Cigarettes    Quit date: 09/05/1991  . Smokeless tobacco: Never Used  . Alcohol Use: No     Comment: none since 1992   OB History    No data available     Review of Systems  Constitutional: Negative for fever.   HENT: Negative for congestion.   Eyes: Negative for redness.  Respiratory: Negative for shortness of breath.   Cardiovascular: Negative for chest pain.  Gastrointestinal: Positive for nausea and vomiting. Negative for abdominal pain and diarrhea.  Genitourinary: Negative for dysuria.  Musculoskeletal: Negative for back pain.  Skin: Negative for rash.  Neurological: Negative for headaches.  Hematological: Does not bruise/bleed easily.  Psychiatric/Behavioral: Negative for confusion.      Allergies  Tramadol and Shellfish allergy  Home Medications   Prior to Admission medications   Medication Sig Start Date End Date Taking? Authorizing Provider  acetaminophen (TYLENOL) 500 MG tablet Take 500 mg by mouth every 6 (six) hours as needed (pain).   Yes Historical Provider, MD  amLODipine (NORVASC) 5 MG tablet Take 5 mg by mouth daily.   Yes Historical Provider, MD  atorvastatin (LIPITOR) 10 MG tablet Take 10 mg by mouth daily.   Yes Historical Provider, MD  carvedilol (COREG) 12.5 MG tablet TAKE 1 TABLET BY MOUTH TWICE DAILY WITH A MEAL 08/31/15  Yes Mihai Croitoru, MD  conjugated estrogens (PREMARIN) vaginal cream Place 1 Applicatorful vaginally 3 (three) times a week. Use at bedtime on Monday, Wednesday, Friday   Yes Historical Provider, MD  Dorzolamide HCl-Timolol Mal PF (COSOPT PF) 22.3-6.8 MG/ML SOLN Place 1 drop into both eyes 2 (two) times daily.   Yes Historical Provider, MD  famotidine (PEPCID) 20 MG tablet Take 20 mg by mouth 2 (two) times daily.    Yes Historical Provider, MD  folic acid (FOLVITE) 1 MG tablet Take 1 tablet (1 mg total) by mouth daily. 09/15/15  Yes Mihai Croitoru, MD  furosemide (LASIX) 20 MG tablet Take 20 mg by mouth daily. 05/09/14  Yes Historical Provider, MD  gabapentin (NEURONTIN) 100 MG capsule Take 1 capsule (100 mg total) by mouth 3 (three) times daily as needed (pain). 03/26/16  Yes Kathlen Mody, MD  nitroGLYCERIN (NITRODUR - DOSED IN MG/24 HR) 0.4 mg/hr  patch Place 0.4 mg onto the skin every morning. REMOVE EVERY NIGHT   Yes Historical Provider, MD  nitroGLYCERIN (NITROSTAT) 0.4 MG SL tablet Place 0.4 mg under the tongue every 5 (five) minutes as needed for chest pain.   Yes Historical Provider, MD  prednisoLONE acetate (PRED FORTE) 1 % ophthalmic suspension Place 1 drop into the right eye daily.    Yes Historical Provider, MD  ramipril (ALTACE) 10 MG capsule Take 10 mg by mouth daily.    Yes Historical Provider, MD  Tafluprost (ZIOPTAN) 0.0015 % SOLN Place 1 drop into the right eye at bedtime.   Yes Historical Provider, MD  traMADol (ULTRAM) 50 MG tablet Take 2 tablets (100 mg total) by mouth every 6 (six) hours as needed  for moderate pain or severe pain. 03/26/16  Yes Kathlen Mody, MD  hydrocortisone (PROCTOZONE-HC) 2.5 % rectal cream Place 1 application rectally 2 (two) times daily as needed for hemorrhoids or itching.    Historical Provider, MD  ondansetron (ZOFRAN ODT) 4 MG disintegrating tablet Take 1 tablet (4 mg total) by mouth every 8 (eight) hours as needed for nausea or vomiting. 03/27/16   Vanetta Mulders, MD   BP 133/68 mmHg  Pulse 61  Temp(Src) 98.6 F (37 C) (Oral)  Resp 17  SpO2 96% Physical Exam  Constitutional: She is oriented to person, place, and time. She appears well-developed and well-nourished. No distress.  HENT:  Head: Normocephalic and atraumatic.  Mouth/Throat: Oropharynx is clear and moist.  Eyes: Conjunctivae and EOM are normal. Pupils are equal, round, and reactive to light.  Neck: Normal range of motion. Neck supple.  Cardiovascular: Normal rate, regular rhythm and normal heart sounds.   No murmur heard. Pulmonary/Chest: Effort normal and breath sounds normal.  Abdominal: Soft. Bowel sounds are normal. There is no tenderness.  Musculoskeletal: She exhibits tenderness.  Pain with range of motion of the left shoulder. No obvious deformity. Radial pulses 2+. Sensation intact in the fingers. No evidence of any  focal motor deficit.  Neurological: She is alert and oriented to person, place, and time. No cranial nerve deficit. She exhibits normal muscle tone. Coordination normal.  Skin: Skin is warm. No rash noted.  Nursing note and vitals reviewed.   ED Course  Procedures (including critical care time) Labs Review Labs Reviewed  CBC WITH DIFFERENTIAL/PLATELET - Abnormal; Notable for the following:    Hemoglobin 11.6 (*)    HCT 34.5 (*)    All other components within normal limits  COMPREHENSIVE METABOLIC PANEL - Abnormal; Notable for the following:    Potassium 3.3 (*)    Glucose, Bld 108 (*)    Creatinine, Ser 1.01 (*)    Total Protein 5.9 (*)    ALT 12 (*)    GFR calc non Af Amer 46 (*)    GFR calc Af Amer 53 (*)    All other components within normal limits  I-STAT TROPOININ, ED    Imaging Review Dg Chest 2 View  03/27/2016  CLINICAL DATA:  Left lateral shoulder pain for 1 week. History of rotator cuff tear. Shortness of breath. Hypertension. EXAM: CHEST  2 VIEW COMPARISON:  11/18/2015 FINDINGS: Mild to moderate enlargement of the cardiopericardial silhouette with atherosclerotic aortic arch. Dual lead pacer remains in place. Coronary artery stents are observed. Bony demineralization. Degenerative AC joint spurring bilaterally. Thoracic spondylosis. Upper zone pulmonary vascular prominence. Hilar prominence bilaterally. IMPRESSION: 1. Mild-to-moderate enlargement of the cardiopericardial silhouette with upper zone pulmonary vascular prominence compatible with pulmonary venous hypertension. No overt edema. 2. Bilateral hilar prominence could be due to prominent vasculature or adenopathy. This is essentially stable from January. 3. Atherosclerotic aortic arch. 4. Bony demineralization. 5. Dual lead pacer. Electronically Signed   By: Gaylyn Rong M.D.   On: 03/27/2016 20:45   Dg Shoulder Left  03/27/2016  CLINICAL DATA:  Left lateral shoulder pain for 1 week. EXAM: LEFT SHOULDER - 2+ VIEW  COMPARISON:  03/24/2016 FINDINGS: Mild degenerative AC joint arthropathy. Patient was unable externally rotate the shoulder for today's exam. There appears to be some degenerative glenohumeral spurring. Difficult to assess due glenohumeral space due to the lack of external rotation projection. No glenohumeral dislocation based on the transscapular projection. Underlying bony demineralization is present. IMPRESSION: 1. No fracture  or dislocation identified. 2. Bony demineralization. 3. Degenerative glenohumeral and AC joint arthropathy. Electronically Signed   By: Gaylyn Rong M.D.   On: 03/27/2016 20:46   I have personally reviewed and evaluated these images and lab results as part of my medical decision-making.   EKG Interpretation   Date/Time:  Monday Mar 27 2016 17:32:04 EDT Ventricular Rate:  60 PR Interval:  61 QRS Duration: 114 QT Interval:  452 QTC Calculation: 452 R Axis:   -8 Text Interpretation:  Failure to sense and/or capture (?magnet) Left  ventricular hypertrophy Abnormal T, consider ischemia, diffuse leads  Electronic ventricular pacemaker Confirmed by Jakyrah Holladay  MD, Xylina Rhoads ([id number])  on 03/27/2016 6:48:22 PM      MDM   Final diagnoses:  Shoulder pain, left  Non-intractable vomiting with nausea, vomiting of unspecified type    The patient with a recent admission was admitted on May 26 discharged on May 28. Patient's had the complaint of some chronic left arm pain felt to be due to rotator cuff injury orthopedic workup is pending. Patient here complaining of pain in the left arm really no chest pain. Patient underwent evaluation to make sure there was no atypical chest pain type complaint or silent MI. Troponin was negative chest x-ray negative and left shoulder without evidence of dislocation or bony fracture. Shoulder treated with a sling. Patient's nausea and vomiting may have been secondary to tramadol. Patient here had vomiting controlled with Zofran. Will continue  that at home.    Vanetta Mulders, MD 03/27/16 2342

## 2016-04-05 ENCOUNTER — Encounter: Payer: Self-pay | Admitting: Cardiology

## 2016-04-07 LAB — CUP PACEART REMOTE DEVICE CHECK
Brady Statistic RA Percent Paced: 87 %
Brady Statistic RV Percent Paced: 67 %
Date Time Interrogation Session: 20170609095636
Implantable Lead Implant Date: 20110923
Implantable Lead Implant Date: 20110923
Implantable Lead Location: 753859
Implantable Lead Location: 753860
Lead Channel Impedance Value: 480 Ohm
Lead Channel Impedance Value: 580 Ohm
Lead Channel Pacing Threshold Amplitude: 0.625 V
Lead Channel Pacing Threshold Amplitude: 0.75 V
Lead Channel Pacing Threshold Pulse Width: 0.4 ms
Lead Channel Pacing Threshold Pulse Width: 0.4 ms
Lead Channel Sensing Intrinsic Amplitude: 12 mV
Lead Channel Sensing Intrinsic Amplitude: 2.4 mV
Lead Channel Setting Pacing Amplitude: 0.875
Lead Channel Setting Pacing Amplitude: 2 V
Lead Channel Setting Pacing Pulse Width: 0.4 ms
Lead Channel Setting Sensing Sensitivity: 2 mV
Pulse Gen Model: 2210
Pulse Gen Serial Number: 2561895

## 2016-07-11 ENCOUNTER — Encounter: Payer: Medicare Other | Admitting: Cardiovascular Disease

## 2016-07-25 ENCOUNTER — Encounter: Payer: Self-pay | Admitting: Cardiovascular Disease

## 2016-07-25 ENCOUNTER — Ambulatory Visit (INDEPENDENT_AMBULATORY_CARE_PROVIDER_SITE_OTHER): Payer: Medicare Other | Admitting: Cardiovascular Disease

## 2016-07-25 VITALS — BP 145/79 | HR 60 | Ht 62.0 in | Wt 119.2 lb

## 2016-07-25 DIAGNOSIS — I480 Paroxysmal atrial fibrillation: Secondary | ICD-10-CM | POA: Diagnosis not present

## 2016-07-25 DIAGNOSIS — Z950 Presence of cardiac pacemaker: Secondary | ICD-10-CM

## 2016-07-25 DIAGNOSIS — I495 Sick sinus syndrome: Secondary | ICD-10-CM | POA: Diagnosis not present

## 2016-07-25 DIAGNOSIS — I10 Essential (primary) hypertension: Secondary | ICD-10-CM

## 2016-07-25 DIAGNOSIS — E785 Hyperlipidemia, unspecified: Secondary | ICD-10-CM

## 2016-07-25 DIAGNOSIS — I2510 Atherosclerotic heart disease of native coronary artery without angina pectoris: Secondary | ICD-10-CM | POA: Diagnosis not present

## 2016-07-25 DIAGNOSIS — I2583 Coronary atherosclerosis due to lipid rich plaque: Secondary | ICD-10-CM

## 2016-07-25 HISTORY — DX: Sick sinus syndrome: I49.5

## 2016-07-25 NOTE — Progress Notes (Signed)
Cardiology Office Note    Date:  07/25/2016   ID:  Amy FramesRosa L [name], DOB 07/16/1918, MRN [id number]  PCP:  Amy Carneyean Honsinger, MD  Cardiologist:  Rinaldo CloudMohan Harwani, MD; Thurmon FairMihai Shamarion Coots, MD   Chief Complaint  Patient presents with  . Edema    pt c/o pain in legs, feet and toes  . Follow-up    pt c/o on and off pain above pacemaker and on shoulder    History of Present Illness:  [name] is a 82 y.o. female with sinus node dysfunction and AV conduction disease presenting for pacemaker follow-up. She also has paroxysmal atrial fibrillation, coronary artery disease s/p her septal infarction and LAD stent in 2011, stent to left circumflex in 2013, hyperlipidemia and hypertension for which she is followed by Dr. Sharyn [name].  Make a function is normal. Her [employer] accent DR RF device was implanted in 2011 and has roughly 5.5-6.0 years of remaining longevity. She has 89% atrial pacing and 66% ventricular pacing. She has had roughly 1.3% atrial fibrillation burden since her last visit one year ago. This appears to occur rather randomly and is always self-limited. The last significant episode of atrial fibrillation occurred September 11 and lasted approximately 8 hours. Lead parameters are excellent. Heart rate histogram distribution is favorable, considering her sedentary lifestyle. During atrial fibrillation ventricular rate control is adequate.  She describes occasional episodes of unpredictable chest discomfort not related to physical activity. Sometimes is located around the pacemaker site, sometimes it is retrosternal. She was briefly hospitalized in January and May for complaints of chest pain and left arm pain. Workup was benign.  Has not had syncope and has not had any recent falls. She is however felt to be at high risk of bleeding due to unsteady gait and small body habitus. For this reason she is not prescribed true anticoagulants. She is currently not taking aspirin or clopidogrel either. It  seems these were stopped in May when she was having some orthopedic problems.  She has occasional ankle edema, depending on the type of shoe she wears and how long she stands up. This always resolves after overnight recumbent position. She denies exertional dyspnea, orthopnea or PND.  Past Medical History:  Diagnosis Date  . Anemia   . Angina    on nitro-dur  . Coronary artery disease   . Elevated troponin   . GERD (gastroesophageal reflux disease)   . Glaucoma   . H/O myocardial perfusion scan 07/13/2011   The post stress left ventricle is normal in size. the rest left ventricle is normal in size. there is no scintigraphic evidence of inducible myocardial ishemia. The post-stress left ventricular function is normal. the post-stress ejection fraction is 66%.This is a low risk scan.  [name]. History of eye surgery    bilateral   . HOH (hard of hearing)   . Hyperlipidemia   . Hypertension   . Myocardial infarction (HCC)    x 3  . Pacemaker   . Rotator cuff tear    chronic  . Shortness of breath    with exertion    Past Surgical History:  Procedure Laterality Date  . APPENDECTOMY    . cardiac catherization  06/2010  . CARDIAC CATHETERIZATION    . CARDIAC CATHETERIZATION Left 07/19/2010   LV showed good LV systolic function, LVH EF of 60 % to 65 %. Left main was patent. LAD has 20 % to 30 % proximal stenosis and 99 % mid stenosis with TIMI 2  distal flow. Diagonal-1 was very, very small. Diagonal-2 was very small, Diagonal-3 was small which has 30 % to 40 % ostial stenosis just before the bifurcation with LAD stenosis. Left circumflex has 40 % to 50 % mid stenosis and 70 % to 75 % .  [name] CORONARY ANGIOPLASTY WITH STENT PLACEMENT  02/13/2012  . HYSTEROSCOPY W/D&C  09/07/2011   Procedure: DILATATION AND CURETTAGE (D&C) /HYSTEROSCOPY;  Surgeon: Turner Daniels, MD;  Location: [facility];  Service: Gynecology;  Laterality: N/A;  uterus  . INSERT / REPLACE / REMOVE PACEMAKER    . LEFT HEART CATHETERIZATION  WITH CORONARY ANGIOGRAM N/A 01/25/2012   Procedure: LEFT HEART CATHETERIZATION WITH CORONARY ANGIOGRAM;  Surgeon: Robynn Pane, MD;  Location: [facility] CATH LAB;  Service: Cardiovascular;  Laterality: N/A;  . LEFT HEART CATHETERIZATION WITH CORONARY ANGIOGRAM N/A 12/18/2014   Procedure: LEFT HEART CATHETERIZATION WITH CORONARY ANGIOGRAM;  Surgeon: Robynn Pane, MD;  Location: [facility] CATH LAB;  Service: Cardiovascular;  Laterality: N/A;  . PACEMAKER INSERTION  06/2010  . PERCUTANEOUS CORONARY STENT INTERVENTION (PCI-S) N/A 02/13/2012   Procedure: PERCUTANEOUS CORONARY STENT INTERVENTION (PCI-S);  Surgeon: Robynn Pane, MD;  Location: [facility] CATH LAB;  Service: Cardiovascular;  Laterality: N/A;  . svd     x 2  . THYROIDECTOMY      Current Medications: Outpatient Medications Prior to Visit  Medication Sig Dispense Refill  . acetaminophen (TYLENOL) 500 MG tablet Take 500 mg by mouth every 6 (six) hours as needed (pain).    [name] amLODipine (NORVASC) 5 MG tablet Take 5 mg by mouth daily.    [name] atorvastatin (LIPITOR) 10 MG tablet Take 10 mg by mouth daily.    . carvedilol (COREG) 12.5 MG tablet TAKE 1 TABLET BY MOUTH TWICE DAILY WITH A MEAL 180 tablet 2  . conjugated estrogens (PREMARIN) vaginal cream Place 1 Applicatorful vaginally 3 (three) times a week. Use at bedtime on Monday, Wednesday, Friday    . Dorzolamide HCl-Timolol Mal PF (COSOPT PF) 22.3-6.8 MG/ML SOLN Place 1 drop into both eyes 2 (two) times daily.    . famotidine (PEPCID) 20 MG tablet Take 20 mg by mouth 2 (two) times daily.     . folic acid (FOLVITE) 1 MG tablet Take 1 tablet (1 mg total) by mouth daily. 90 tablet 2  . furosemide (LASIX) 20 MG tablet Take 20 mg by mouth daily.    [name] gabapentin (NEURONTIN) 100 MG capsule Take 1 capsule (100 mg total) by mouth 3 (three) times daily as needed (pain). 90 capsule 0  . hydrocortisone (PROCTOZONE-HC) 2.5 % rectal cream Place 1 application rectally 2 (two) times daily as needed for hemorrhoids or  itching.    . nitroGLYCERIN (NITRODUR - DOSED IN MG/24 HR) 0.4 mg/hr patch Place 0.4 mg onto the skin every morning. REMOVE EVERY NIGHT    . nitroGLYCERIN (NITROSTAT) 0.4 MG SL tablet Place 0.4 mg under the tongue every 5 (five) minutes as needed for chest pain.    [name] ondansetron (ZOFRAN ODT) 4 MG disintegrating tablet Take 1 tablet (4 mg total) by mouth every 8 (eight) hours as needed for nausea or vomiting. 12 tablet 2  . prednisoLONE acetate (PRED FORTE) 1 % ophthalmic suspension Place 1 drop into the right eye daily.     . ramipril (ALTACE) 10 MG capsule Take 10 mg by mouth daily.     . Tafluprost (ZIOPTAN) 0.0015 % SOLN Place 1 drop into the right eye at bedtime.    [name]  traMADol (ULTRAM) 50 MG tablet Take 2 tablets (100 mg total) by mouth every 6 (six) hours as needed for moderate pain or severe pain. 20 tablet 0   No facility-administered medications prior to visit.      Allergies:   Tramadol and Shellfish allergy   Social History   Social History  . Marital status: Widowed    Spouse name: N/A  . Number of children: 2  . Years of education: N/A   Occupational History  . retired    Social History Main Topics  . Smoking status: Former Smoker    Packs/day: 0.50    Types: Cigarettes    Quit date: 09/05/1991  . Smokeless tobacco: Never Used  . Alcohol use No     Comment: none since 1992  . Drug use: No  . Sexual activity: Not Currently   Other Topics Concern  . Not on file   Social History Narrative  . No narrative on file     Family History:  The patient's family history includes Cancer in her sister; Diabetes in her mother.   ROS:   Please see the history of present illness.    ROS All other systems reviewed and are negative.   PHYSICAL EXAM:   VS:  BP (!) 145/79   Pulse 60   Ht 5\' 2"  (1.575 m)   Wt 54.1 kg (119 lb 3.2 oz)   BMI 21.80 kg/m    GEN: Well nourished, well developed, in no acute distress . Very slender and rather frail appearance HEENT: normal    Neck: no JVD, carotid bruits, or masses Cardiac: RRR; no murmurs, rubs, or gallops,no edema ; the left subclavian pacemaker site Respiratory:  clear to auscultation bilaterally, normal work of breathing GI: soft, nontender, nondistended, + BS MS: no deformity or atrophy  Skin: warm and dry, no rash Neuro:  Alert and Oriented x 3, Strength and sensation are intact Psych: euthymic mood, full affect  Wt Readings from Last 3 Encounters:  07/25/16 54.1 kg (119 lb 3.2 oz)  03/26/16 54.3 kg (119 lb 12.8 oz)  11/18/15 55.5 kg (122 lb 5.7 oz)      Studies/Labs Reviewed:   EKG:  EKG is ordered today.  The ekg ordered today demonstrates A paced V paced.  Recent Labs: 11/18/2015: B Natriuretic Peptide 263.8; Magnesium 2.1 03/27/2016: ALT 12; BUN 12; Creatinine, Ser 1.01; Hemoglobin 11.6; Platelets 186; Potassium 3.3; Sodium 139   Lipid Panel    Component Value Date/Time   CHOL 211 (H) 03/24/2016 0627   TRIG 60 03/24/2016 0627   HDL 74 03/24/2016 0627   CHOLHDL 2.9 03/24/2016 0627   VLDL 12 03/24/2016 0627   LDLCALC 125 (H) 03/24/2016 1610     ASSESSMENT:    1. SSS (sick sinus syndrome) (HCC)   2. Paroxysmal atrial fibrillation (HCC)   3. Pacemaker   4. Coronary artery disease due to lipid rich plaque   5. Hyperlipidemia   6. Essential hypertension      PLAN:  In order of problems listed above:  1. SSS: Satisfactory pacemaker settings considering her sedentary lifestyle. Slowly increasing frequency of ventricular pacing is consistent with worsening AV conduction time, but has not had any negative impact on myocardial performance to date. 2. PAF: Low burden of atrial fibrillation, but continues to have lengthy episodes including 8 hours in September and 3 days in July. Rate control is fair with average ventricular rates in the 80s. Currently not on any anticoagulation therapy and also  no longer on aspirin. I'm not sure why this was stopped. Will discuss with her primary  cardiologist. 3. PPM: Normal device function. Continue remote monitoring every 3 months via merlin and yearly office visit 4. CAD: The chest discomfort she describes is not typical for angina. Continue conservative management. 5. HLP: Lipid profile performed in May showed unsatisfactory reduction in total and LDL cholesterol, but this had deteriorated from previous assays. Question compliance with statin around that time. Follow-up with her primary care provider and cardiologist. 6. HTN: Borderline elevated blood pressure today. No changes made.    Medication Adjustments/Labs and Tests Ordered: Current medicines are reviewed at length with the patient today.  Concerns regarding medicines are outlined above.  Medication changes, Labs and Tests ordered today are listed in the Patient Instructions below. Patient Instructions  Dr Royann Shivers recommends that you continue on your current medications as directed. Please refer to the Current Medication list given to you today.  Remote monitoring is used to monitor your Pacemaker of ICD from home. This monitoring reduces the number of office visits required to check your device to one time per year. It allows [place] to keep an eye on the functioning of your device to ensure it is working properly. You are scheduled for a device check from home on Tuesday, December 26th, 2017. You may send your transmission at any time that day. If you have a wireless device, the transmission will be sent automatically. After your physician reviews your transmission, you will receive a postcard with your next transmission date.  Dr Royann Shivers recommends that you schedule a follow-up appointment in 12 months with a pacemaker check. You will receive a reminder letter in the mail two months in advance. If you don't receive a letter, please call our office to schedule the follow-up appointment.  If you need a refill on your cardiac medications before your next appointment, please call  your pharmacy.    Signed, Thurmon Fair, MD  07/25/2016 9:13 AM    [facility] [address] [place], [place], [place]  [address] Phone: [phone]; Fax: [phone]

## 2016-07-25 NOTE — Patient Instructions (Signed)
Dr Croitoru recommends that you continue on your current medications as directed. Please refer to the Current Medication list given to you today.  Remote monitoring is used to monitor your Pacemaker of ICD from home. This monitoring reduces the number of office visits required to check your device to one time per year. It allows us to keep an eye on the functioning of your device to ensure it is working properly. You are scheduled for a device check from home on Tuesday, December 26th, 2017. You may send your transmission at any time that day. If you have a wireless device, the transmission will be sent automatically. After your physician reviews your transmission, you will receive a postcard with your next transmission date.  Dr Croitoru recommends that you schedule a follow-up appointment in 12 months with a pacemaker check. You will receive a reminder letter in the mail two months in advance. If you don't receive a letter, please call our office to schedule the follow-up appointment.  If you need a refill on your cardiac medications before your next appointment, please call your pharmacy. 

## 2016-09-08 LAB — CUP PACEART INCLINIC DEVICE CHECK
Battery Remaining Longevity: 66 mo
Date Time Interrogation Session: 20171110153814
Implantable Lead Implant Date: 20110923
Implantable Lead Implant Date: 20110923
Implantable Lead Location: 753859
Implantable Lead Location: 753860
Implantable Pulse Generator Implant Date: 20110923
Lead Channel Setting Pacing Amplitude: 1 V
Lead Channel Setting Pacing Amplitude: 2 V
Lead Channel Setting Pacing Pulse Width: 0.4 ms
Lead Channel Setting Sensing Sensitivity: 2 mV
Pulse Gen Model: 2210
Pulse Gen Serial Number: 2561895

## 2016-09-11 IMAGING — DX DG SHOULDER 2+V*L*
3 series · 3 of 3 positions shown · non-contrast
Comparison: None.

CLINICAL DATA: Left-sided shoulder and arm pain for a week.

EXAM:
LEFT SHOULDER - 2+ VIEW

[t shoulder internal left]
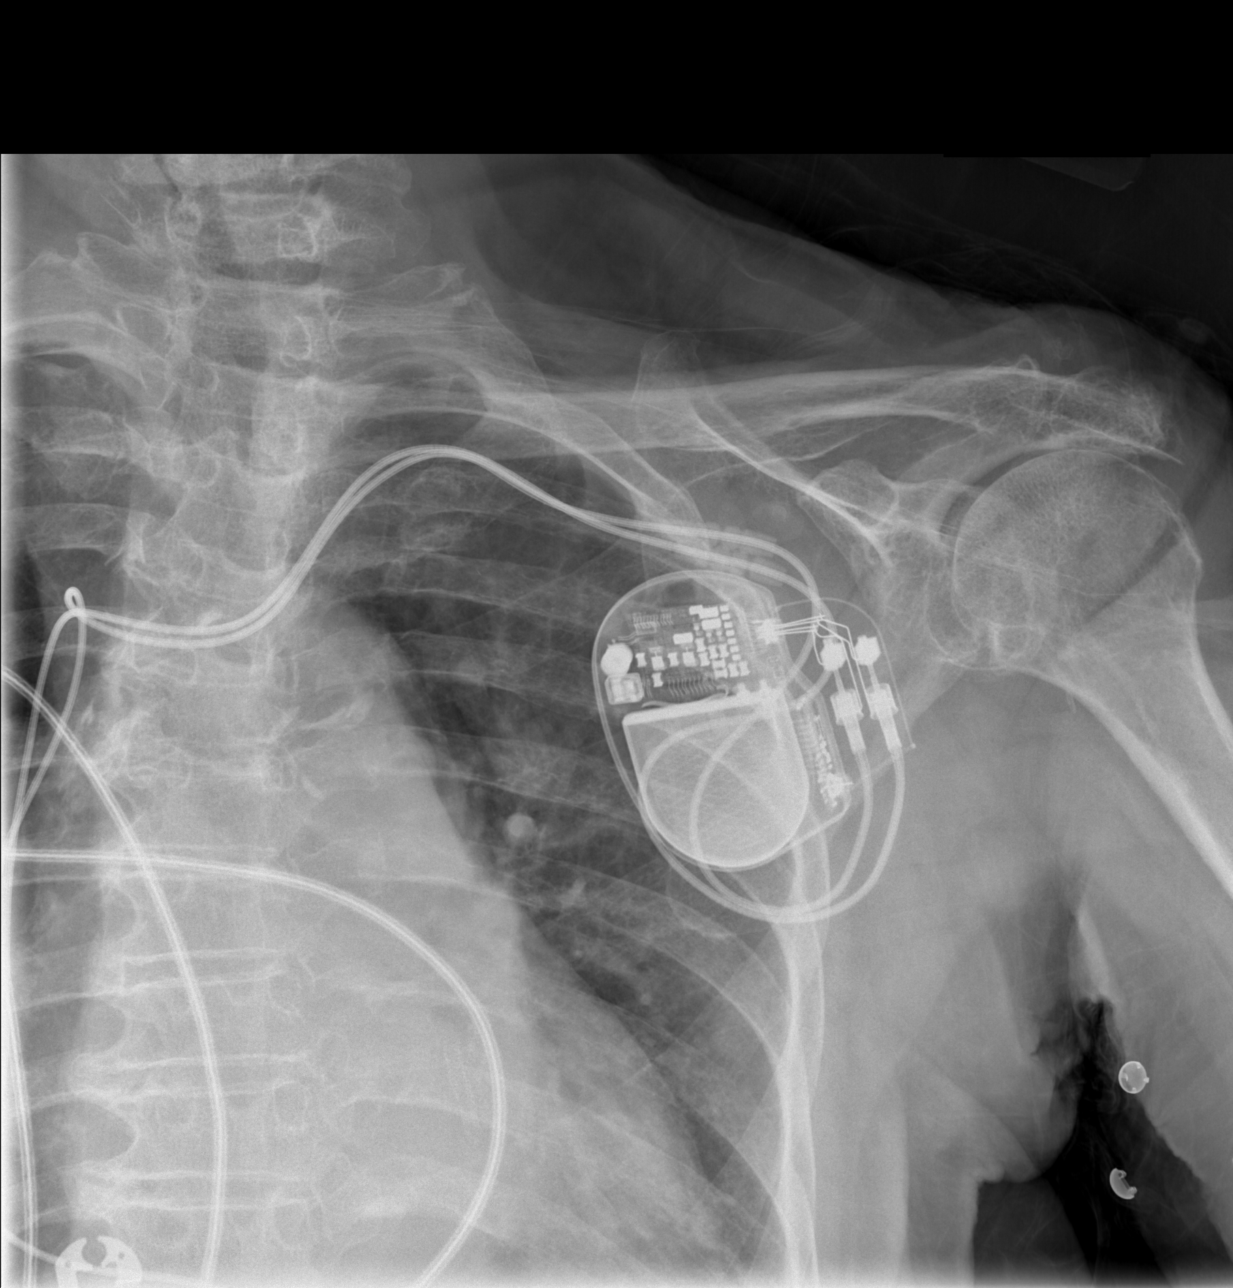

[t shoulder external left]
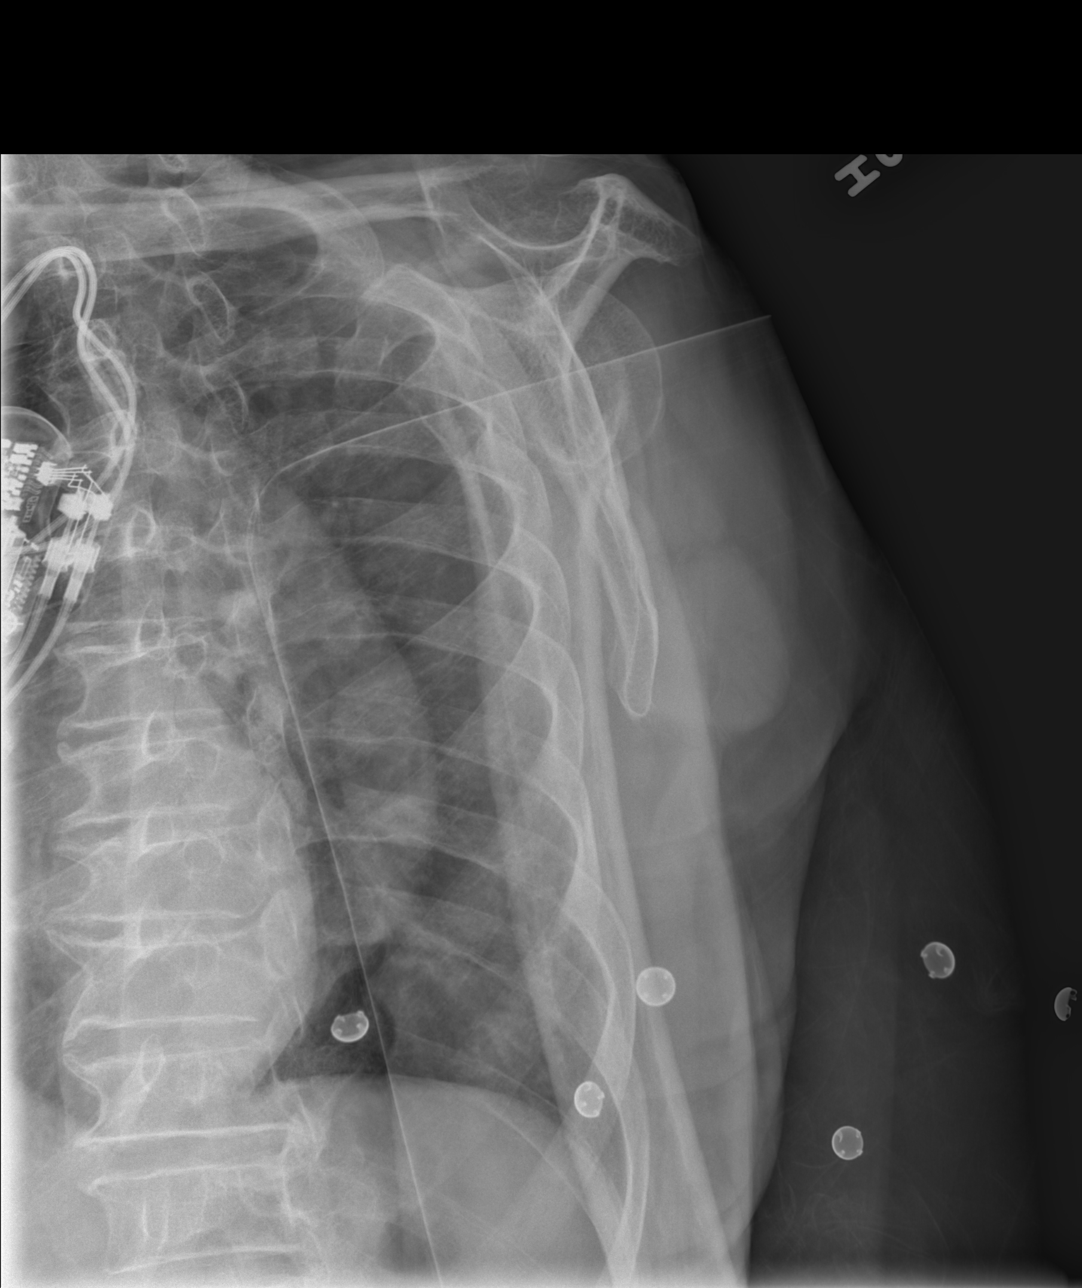

[t shoulder y-view left]
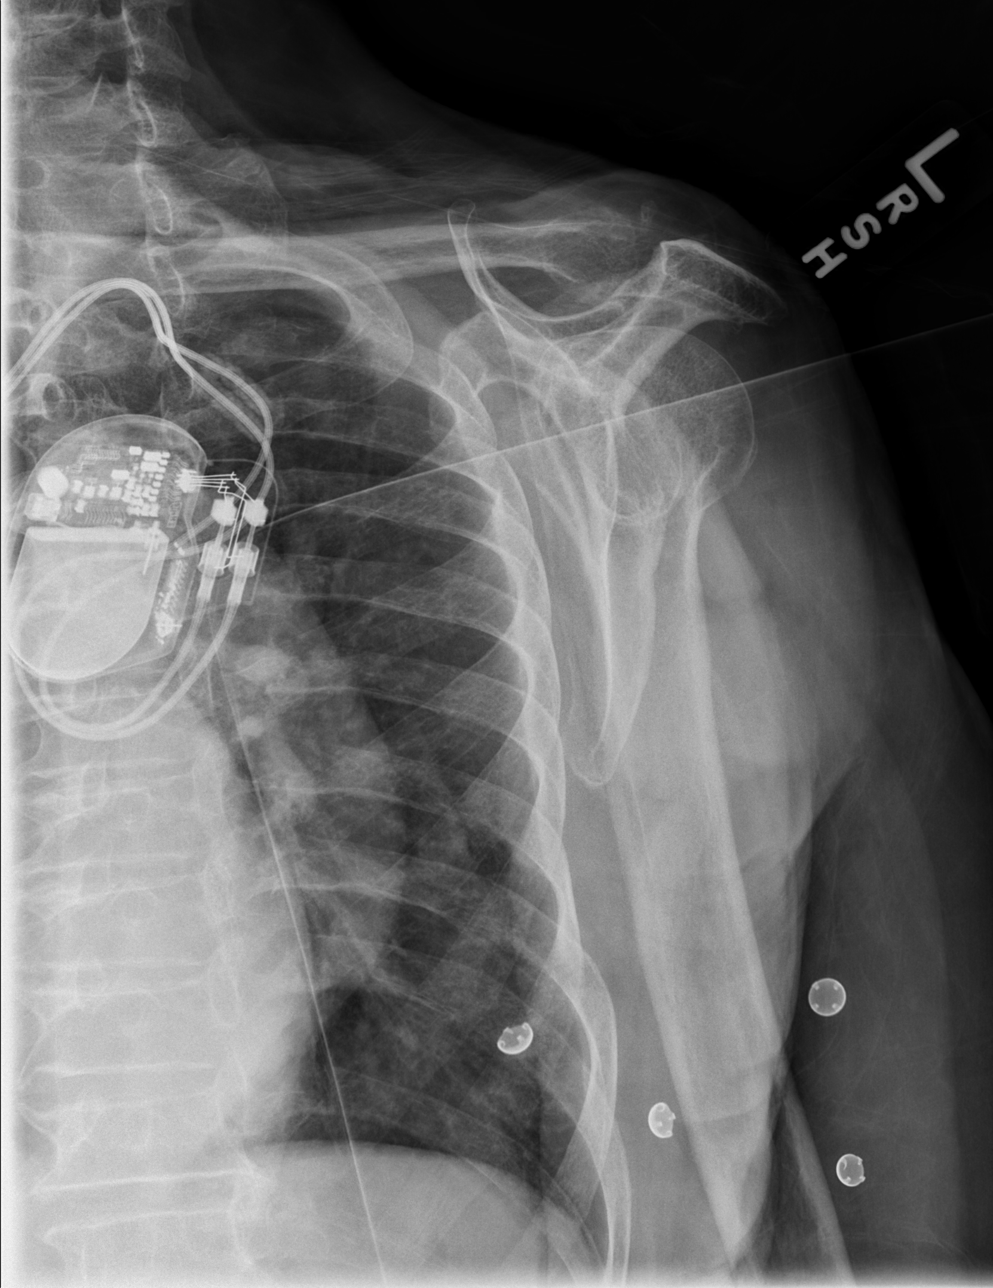

[3 of 3 positions shown; findings below may reference images not displayed]

FINDINGS: High humeral head suggesting chronic rotator cuff tear.

Acromioclavicular osteoarthritis with spurring and fragmentation,
mild for age. Mild degenerative marginal spurring about the
glenohumeral joint. No active findings in the left chest wall.

Negative for acute fracture or dislocation.
IMPRESSION: 1. No acute finding.
2. High humeral head suggesting chronic rotator cuff tear.
3. Mild osteoarthritis.

## 2016-09-14 IMAGING — CR DG CHEST 2V
2 series · 2 of 2 positions shown · non-contrast
Comparison: 11/18/2015

CLINICAL DATA: Left lateral shoulder pain for 1 week. History of
rotator cuff tear. Shortness of breath. Hypertension.

EXAM:
CHEST  2 VIEW

[chest lat]
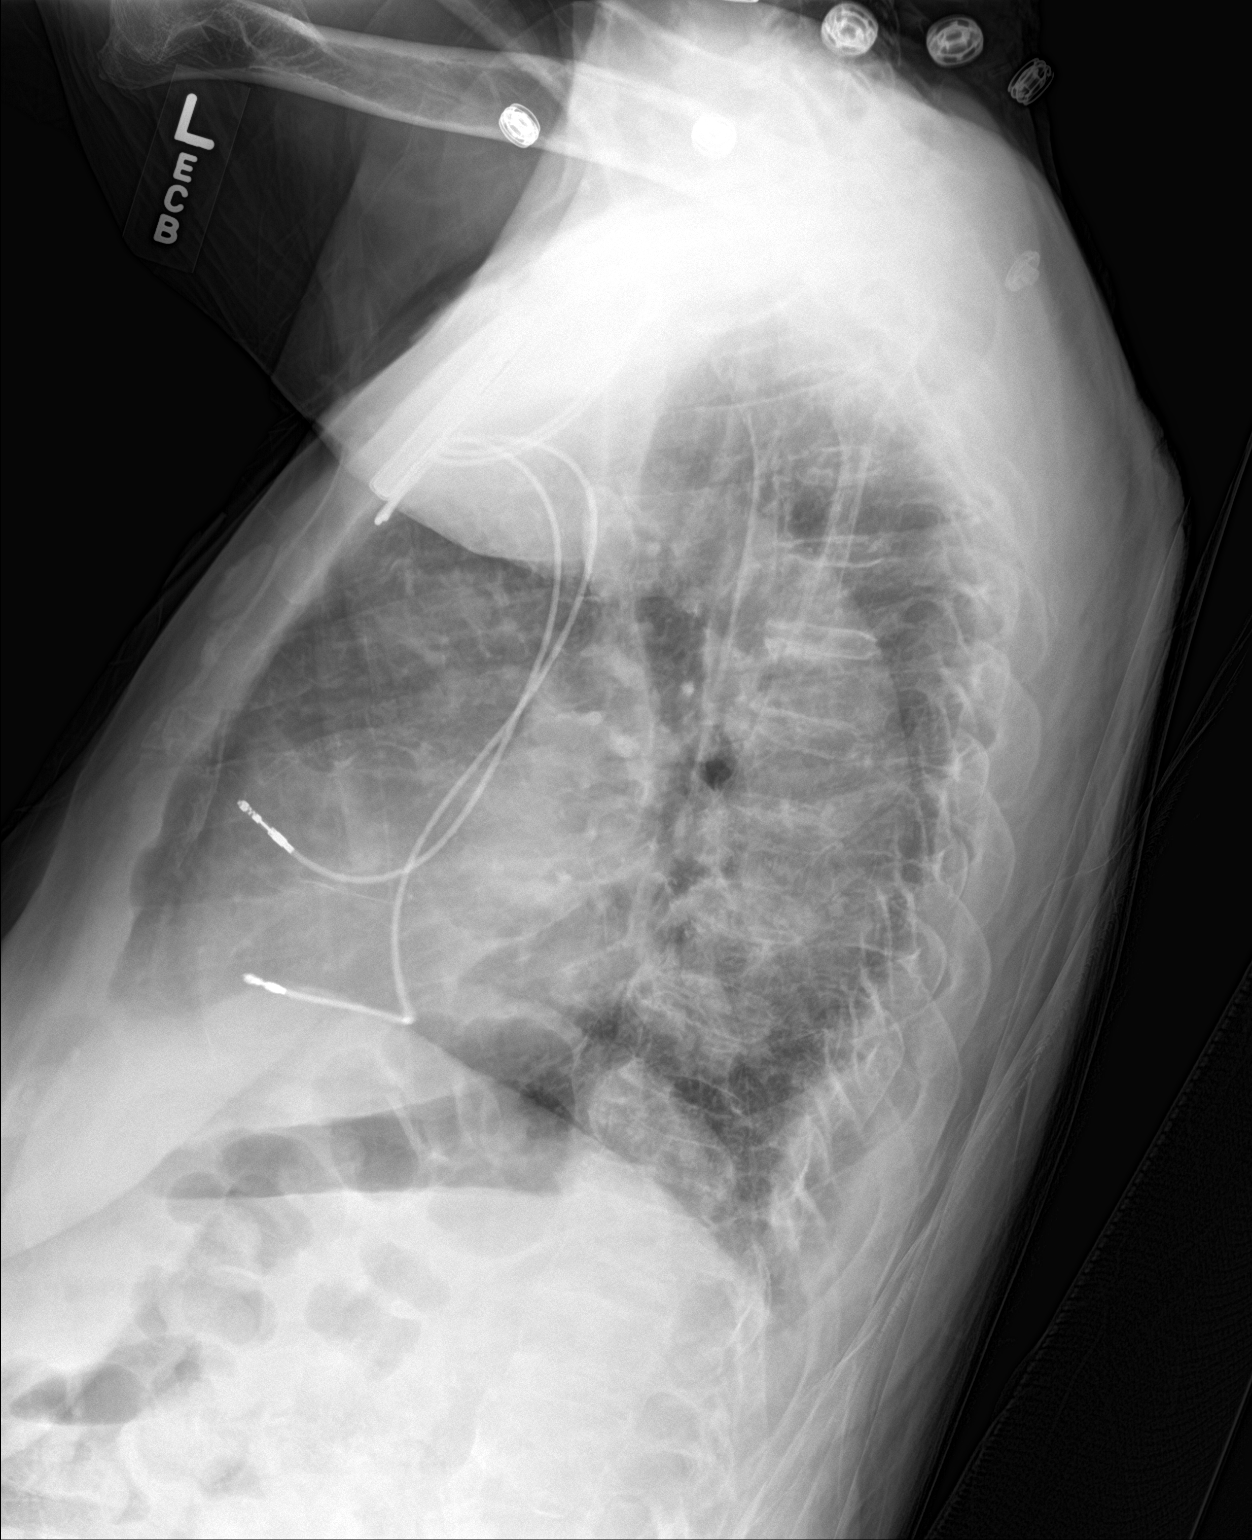

[chest ap]
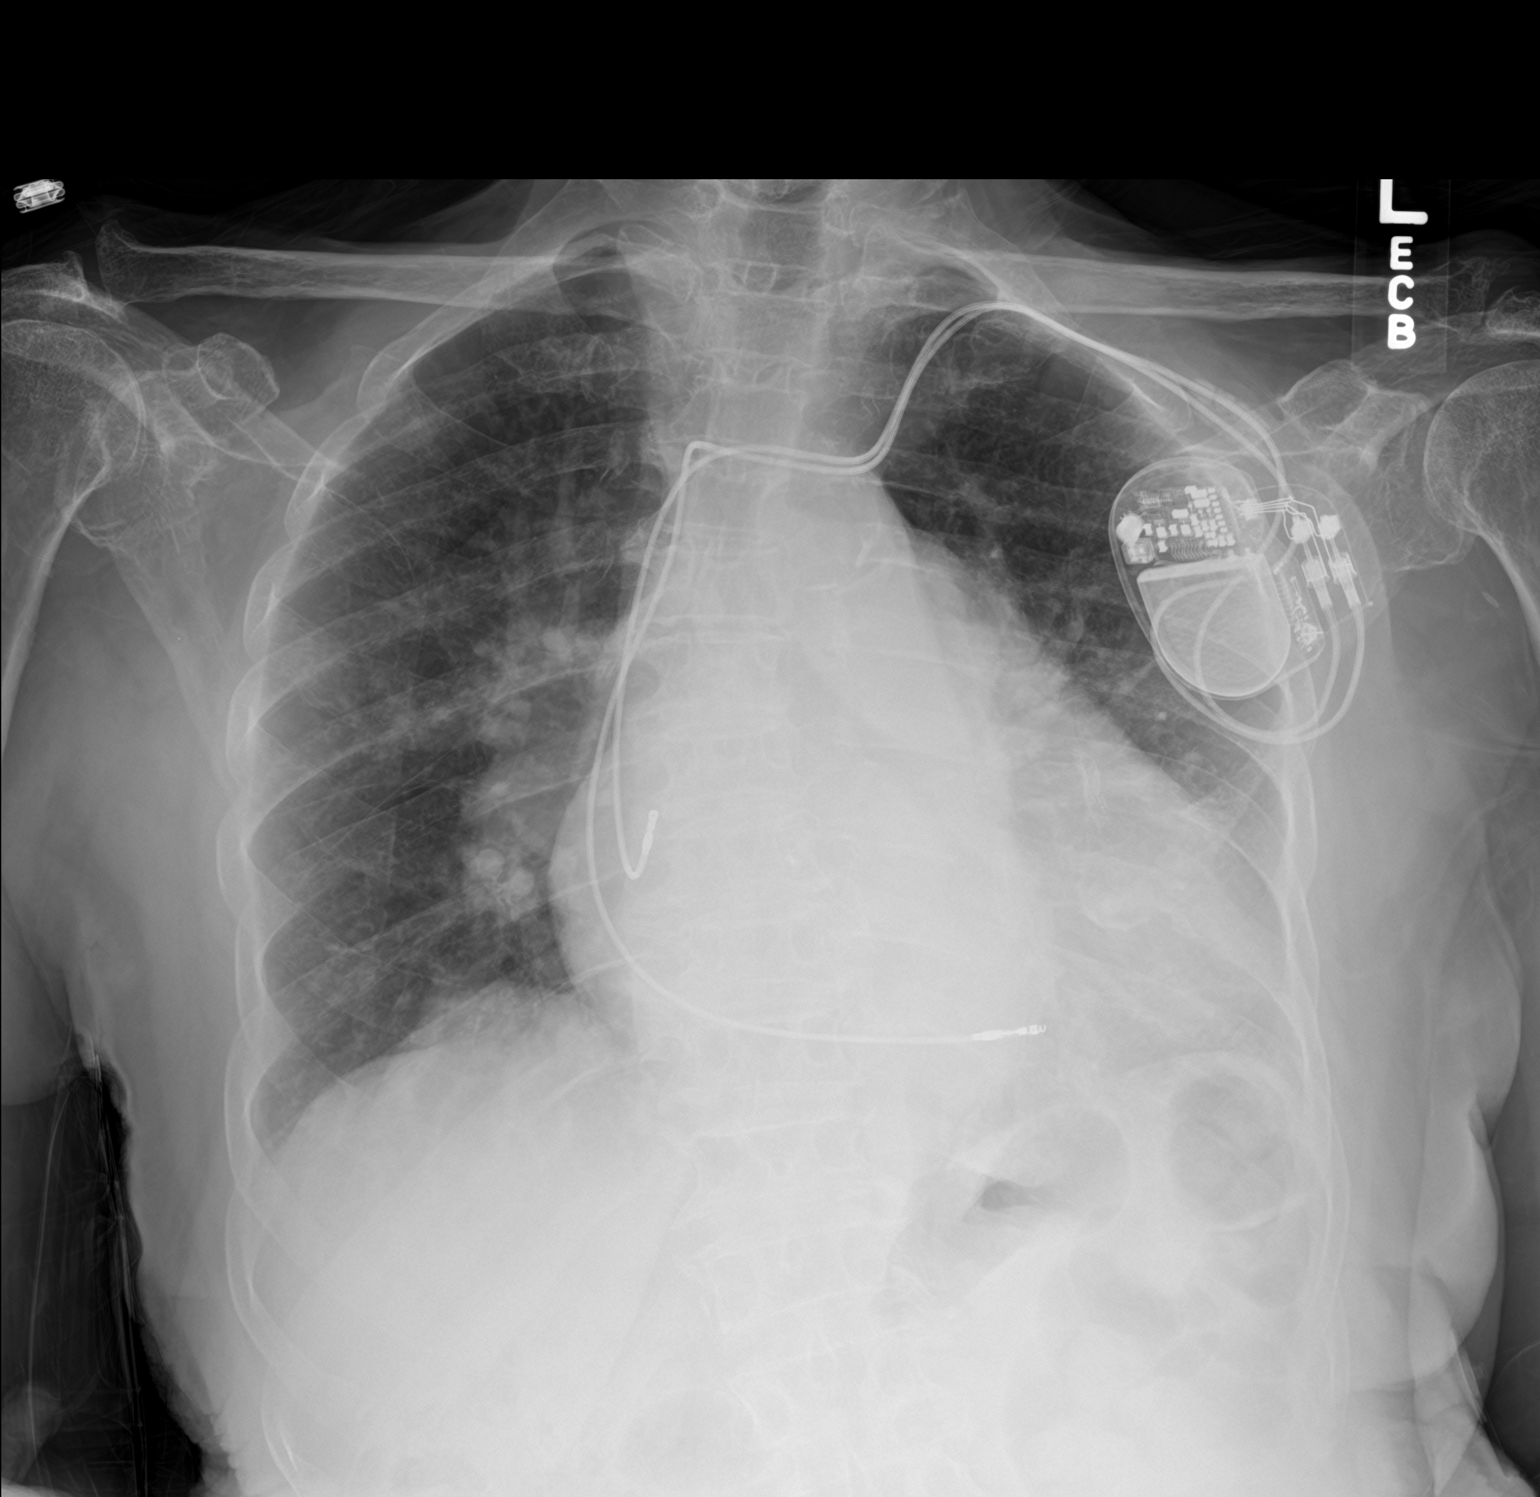

[2 of 2 positions shown; findings below may reference images not displayed]

FINDINGS: Mild to moderate enlargement of the cardiopericardial silhouette
with atherosclerotic aortic arch. Dual lead pacer remains in place.
Coronary artery stents are observed.

Bony demineralization. Degenerative AC joint spurring bilaterally.
Thoracic spondylosis.

Upper zone pulmonary vascular prominence. Hilar prominence
bilaterally.
IMPRESSION: 1. Mild-to-moderate enlargement of the cardiopericardial silhouette
with upper zone pulmonary vascular prominence compatible with
pulmonary venous hypertension. No overt edema.
2. Bilateral hilar prominence could be due to prominent vasculature
or adenopathy. This is essentially stable from [DATE]. Atherosclerotic aortic arch.
4. Bony demineralization.
5. Dual lead pacer.

## 2016-09-15 ENCOUNTER — Ambulatory Visit (INDEPENDENT_AMBULATORY_CARE_PROVIDER_SITE_OTHER): Payer: Medicare Other | Admitting: Orthopaedic Surgery

## 2016-09-15 DIAGNOSIS — M7542 Impingement syndrome of left shoulder: Secondary | ICD-10-CM | POA: Diagnosis not present

## 2016-09-15 MED ORDER — BUPIVACAINE HCL 0.5 % IJ SOLN
3.0000 mL | INTRAMUSCULAR | Status: AC | PRN
  Administered 2016-09-15: 3 mL via INTRA_ARTICULAR

## 2016-09-15 MED ORDER — LIDOCAINE HCL 1 % IJ SOLN
3.0000 mL | INTRAMUSCULAR | Status: AC | PRN
  Administered 2016-09-15: 3 mL

## 2016-09-15 MED ORDER — METHYLPREDNISOLONE ACETATE 40 MG/ML IJ SUSP
40.0000 mg | INTRAMUSCULAR | Status: AC | PRN
  Administered 2016-09-15: 40 mg via INTRA_ARTICULAR

## 2016-09-15 NOTE — Progress Notes (Signed)
Office Visit Note   Patient: Amy FramesRosa L [name]           Date of Birth: 07/16/1918           MRN: [id number] Visit Date: 09/15/2016              Requested by: L.Lupe Carneyean Swearengin, MD [address] [place], [place] [id number] PCP: Lupe Carneyean Robards, MD   Assessment & Plan: Visit Diagnoses:  1. Impingement syndrome of left shoulder     Plan:  - subacromial injection performed - if no improvement, will need c spine imaging - f/u prn  Follow-Up Instructions: Return if symptoms worsen or fail to improve.   Orders:  No orders of the defined types were placed in this encounter.  No orders of the defined types were placed in this encounter.     Procedures: Large Joint Inj Date/Time: 09/15/2016 1:27 PM Performed by: Tarry KosXU, Caspian Deleonardis M Authorized by: Tarry KosXU, Orine Goga M   Consent Given by:  Patient Timeout: prior to procedure the correct patient, procedure, and site was verified   Location:  Shoulder Site:  L subacromial bursa Prep: patient was prepped and draped in usual sterile fashion   Needle Size:  22 G Approach:  Posterior Ultrasound Guidance: No   Fluoroscopic Guidance: No   Arthrogram: No   Medications:  3 mL lidocaine 1 %; 3 mL bupivacaine 0.5 %; 40 mg methylPREDNISolone acetate 40 MG/ML     Clinical Data: No additional findings.   Subjective: Chief Complaint  Patient presents with  . Right Shoulder - Pain    82 yo female here for another shoulder injection.  Had one in June with good relief.  Pain is radiating down to her hand.  Denies neck pain.    Review of Systems   Objective: Vital Signs: There were no vitals taken for this visit.  Physical Exam  Left Shoulder Exam   Tests  Hawkin's test: positive Impingement: positive  Comments:  Negative spurling      Specialty Comments:  No specialty comments available.  Imaging: No results found.   PMFS History: Patient Active Problem List   Diagnosis Date Noted  . Impingement syndrome of  left shoulder 09/15/2016  . SSS (sick sinus syndrome) (HCC) 07/25/2016  . Paroxysmal atrial fibrillation (HCC) 07/25/2016  . Elevated troponin 03/24/2016  . Left shoulder pain 03/24/2016  . Rotator cuff tear   . HOH (hard of hearing)   . Left arm pain   . Unstable angina (HCC) 11/18/2015  . Chest pain 12/17/2014  . CAD (coronary artery disease) 04/11/2013  . HTN (hypertension) 04/11/2013  . Pacemaker   . Hyperlipidemia    Past Medical History:  Diagnosis Date  . Anemia   . Angina    on nitro-dur  . Coronary artery disease   . Elevated troponin   . GERD (gastroesophageal reflux disease)   . Glaucoma   . H/O myocardial perfusion scan 07/13/2011   The post stress left ventricle is normal in size. the rest left ventricle is normal in size. there is no scintigraphic evidence of inducible myocardial ishemia. The post-stress left ventricular function is normal. the post-stress ejection fraction is 66%.This is a low risk scan.  Marland Kitchen. History of eye surgery    bilateral   . HOH (hard of hearing)   . Hyperlipidemia   . Hypertension   . Myocardial infarction    x 3  . Pacemaker   . Rotator cuff tear    chronic  .  Shortness of breath    with exertion  . SSS (sick sinus syndrome) (HCC) 07/25/2016    Family History  Problem Relation Age of Onset  . Diabetes Mother   . Cancer Sister     Past Surgical History:  Procedure Laterality Date  . APPENDECTOMY    . cardiac catherization  06/2010  . CARDIAC CATHETERIZATION    . CARDIAC CATHETERIZATION Left 07/19/2010   LV showed good LV systolic function, LVH EF of 60 % to 65 %. Left main was patent. LAD has 20 % to 30 % proximal stenosis and 99 % mid stenosis with TIMI 2 distal flow. Diagonal-1 was very, very small. Diagonal-2 was very small, Diagonal-3 was small which has 30 % to 40 % ostial stenosis just before the bifurcation with LAD stenosis. Left circumflex has 40 % to 50 % mid stenosis and 70 % to 75 % .  Marland Kitchen. CORONARY ANGIOPLASTY WITH STENT  PLACEMENT  02/13/2012  . HYSTEROSCOPY W/D&C  09/07/2011   Procedure: DILATATION AND CURETTAGE (D&C) /HYSTEROSCOPY;  Surgeon: Turner Danielsavid C Lowe, MD;  Location: [facility];  Service: Gynecology;  Laterality: N/A;  uterus  . INSERT / REPLACE / REMOVE PACEMAKER    . LEFT HEART CATHETERIZATION WITH CORONARY ANGIOGRAM N/A 01/25/2012   Procedure: LEFT HEART CATHETERIZATION WITH CORONARY ANGIOGRAM;  Surgeon: Robynn PaneMohan N Harwani, MD;  [facility];  Service: Cardiovascular;  Laterality: N/A;  . LEFT HEART CATHETERIZATION WITH CORONARY ANGIOGRAM N/A 12/18/2014   Procedure: LEFT HEART CATHETERIZATION WITH CORONARY ANGIOGRAM;  Surgeon: Robynn PaneMohan N Harwani, MD;  Location: [facility];  Service: Cardiovascular;  Laterality: N/A;  . PACEMAKER INSERTION  06/2010  . PERCUTANEOUS CORONARY STENT INTERVENTION (PCI-S) N/A 02/13/2012   Procedure: PERCUTANEOUS CORONARY STENT INTERVENTION (PCI-S);  Surgeon: Robynn PaneMohan N Harwani, MD;  [facility];  Service: Cardiovascular;  Laterality: N/A;  . svd     x 2  . THYROIDECTOMY     Social History   Occupational History  . retired    Social History Main Topics  . Smoking status: Former Smoker    Packs/day: 0.50    Types: Cigarettes    Quit date: 09/05/1991  . Smokeless tobacco: Never Used  . Alcohol use No     Comment: none since 1992  . Drug use: No  . Sexual activity: Not Currently

## 2016-09-15 NOTE — Patient Instructions (Signed)

## 2016-10-24 ENCOUNTER — Ambulatory Visit (INDEPENDENT_AMBULATORY_CARE_PROVIDER_SITE_OTHER): Payer: Medicare Other | Admitting: *Deleted

## 2016-10-24 DIAGNOSIS — I495 Sick sinus syndrome: Secondary | ICD-10-CM | POA: Diagnosis not present

## 2016-10-25 NOTE — Progress Notes (Signed)
Remote pacemaker transmission.   

## 2016-10-26 LAB — CUP PACEART REMOTE DEVICE CHECK
Battery Voltage: 3.01 V
Brady Statistic RA Percent Paced: 1.7 %
Brady Statistic RV Percent Paced: 77 %
Date Time Interrogation Session: 20171228160322
Implantable Lead Implant Date: 20110923
Implantable Lead Implant Date: 20110923
Implantable Lead Location: 753859
Implantable Lead Location: 753860
Implantable Pulse Generator Implant Date: 20110923
Lead Channel Impedance Value: 430 Ohm
Lead Channel Impedance Value: 530 Ohm
Lead Channel Setting Pacing Amplitude: 1 V
Lead Channel Setting Pacing Amplitude: 2 V
Lead Channel Setting Pacing Pulse Width: 0.4 ms
Lead Channel Setting Sensing Sensitivity: 2 mV
Pulse Gen Model: 2210
Pulse Gen Serial Number: 2561895

## 2016-10-27 ENCOUNTER — Encounter: Payer: Self-pay | Admitting: Cardiology

## 2017-01-01 ENCOUNTER — Other Ambulatory Visit: Payer: Self-pay | Admitting: Cardiovascular Disease

## 2017-01-23 ENCOUNTER — Ambulatory Visit (INDEPENDENT_AMBULATORY_CARE_PROVIDER_SITE_OTHER): Payer: Medicare Other | Admitting: *Deleted

## 2017-01-23 DIAGNOSIS — I495 Sick sinus syndrome: Secondary | ICD-10-CM | POA: Diagnosis not present

## 2017-01-24 LAB — CUP PACEART REMOTE DEVICE CHECK
Date Time Interrogation Session: 20180328170715
Implantable Lead Implant Date: 20110923
Implantable Lead Implant Date: 20110923
Implantable Lead Location: 753859
Implantable Lead Location: 753860
Implantable Pulse Generator Implant Date: 20110923
Lead Channel Setting Pacing Amplitude: 1 V
Lead Channel Setting Pacing Amplitude: 2 V
Lead Channel Setting Pacing Pulse Width: 0.4 ms
Lead Channel Setting Sensing Sensitivity: 2 mV
Pulse Gen Model: 2210
Pulse Gen Serial Number: 2561895

## 2017-01-24 NOTE — Progress Notes (Signed)
Remote pacemaker transmission.   

## 2017-01-26 ENCOUNTER — Encounter: Payer: Self-pay | Admitting: Cardiology

## 2017-04-02 ENCOUNTER — Other Ambulatory Visit: Payer: Self-pay | Admitting: Cardiovascular Disease

## 2017-04-02 NOTE — Telephone Encounter (Signed)
Rx request sent to pharmacy.  

## 2017-04-24 ENCOUNTER — Ambulatory Visit (INDEPENDENT_AMBULATORY_CARE_PROVIDER_SITE_OTHER): Payer: Medicare Other | Admitting: *Deleted

## 2017-04-24 DIAGNOSIS — I495 Sick sinus syndrome: Secondary | ICD-10-CM

## 2017-04-24 NOTE — Progress Notes (Signed)
Remote pacemaker transmission.   

## 2017-04-25 ENCOUNTER — Encounter: Payer: Self-pay | Admitting: Cardiology

## 2017-04-26 LAB — CUP PACEART REMOTE DEVICE CHECK
Battery Voltage: 2.87 V
Brady Statistic RA Percent Paced: 86 %
Brady Statistic RV Percent Paced: 69 %
Date Time Interrogation Session: 20180628110600
Implantable Lead Implant Date: 20110923
Implantable Lead Implant Date: 20110923
Implantable Lead Location: 753859
Implantable Lead Location: 753860
Implantable Pulse Generator Implant Date: 20110923
Lead Channel Impedance Value: 460 Ohm
Lead Channel Impedance Value: 540 Ohm
Lead Channel Setting Pacing Amplitude: 1 V
Lead Channel Setting Pacing Amplitude: 2 V
Lead Channel Setting Pacing Pulse Width: 0.4 ms
Lead Channel Setting Sensing Sensitivity: 2 mV
Pulse Gen Model: 2210
Pulse Gen Serial Number: 2561895

## 2017-07-24 ENCOUNTER — Emergency Department (HOSPITAL_COMMUNITY)
Admission: EM | Admit: 2017-07-24 | Discharge: 2017-07-24 | Disposition: A | Discharge status: Home / Self Care | Payer: Medicare Other | Attending: Emergency Medicine | Admitting: Emergency Medicine

## 2017-07-24 ENCOUNTER — Ambulatory Visit (INDEPENDENT_AMBULATORY_CARE_PROVIDER_SITE_OTHER): Payer: Medicare Other | Admitting: *Deleted

## 2017-07-24 ENCOUNTER — Encounter (HOSPITAL_COMMUNITY): Payer: Self-pay | Admitting: Emergency Medicine

## 2017-07-24 ENCOUNTER — Telehealth: Payer: Self-pay | Admitting: Cardiology

## 2017-07-24 DIAGNOSIS — I10 Essential (primary) hypertension: Secondary | ICD-10-CM | POA: Diagnosis not present

## 2017-07-24 DIAGNOSIS — Z79899 Other long term (current) drug therapy: Secondary | ICD-10-CM | POA: Insufficient documentation

## 2017-07-24 DIAGNOSIS — Z87891 Personal history of nicotine dependence: Secondary | ICD-10-CM | POA: Diagnosis not present

## 2017-07-24 DIAGNOSIS — B370 Candidal stomatitis: Secondary | ICD-10-CM | POA: Diagnosis not present

## 2017-07-24 DIAGNOSIS — J029 Acute pharyngitis, unspecified: Secondary | ICD-10-CM | POA: Diagnosis present

## 2017-07-24 DIAGNOSIS — I2510 Atherosclerotic heart disease of native coronary artery without angina pectoris: Secondary | ICD-10-CM | POA: Insufficient documentation

## 2017-07-24 DIAGNOSIS — I252 Old myocardial infarction: Secondary | ICD-10-CM | POA: Insufficient documentation

## 2017-07-24 DIAGNOSIS — I495 Sick sinus syndrome: Secondary | ICD-10-CM | POA: Diagnosis not present

## 2017-07-24 DIAGNOSIS — Z950 Presence of cardiac pacemaker: Secondary | ICD-10-CM | POA: Diagnosis not present

## 2017-07-24 DIAGNOSIS — E785 Hyperlipidemia, unspecified: Secondary | ICD-10-CM | POA: Insufficient documentation

## 2017-07-24 LAB — COMPREHENSIVE METABOLIC PANEL
ALT: 11 U/L — ABNORMAL LOW (ref 14–54)
AST: 16 U/L (ref 15–41)
Albumin: 3.8 g/dL (ref 3.5–5.0)
Alkaline Phosphatase: 46 U/L (ref 38–126)
Anion gap: 9 (ref 5–15)
BUN: 13 mg/dL (ref 6–20)
CO2: 21 mmol/L — ABNORMAL LOW (ref 22–32)
Calcium: 9.3 mg/dL (ref 8.9–10.3)
Chloride: 110 mmol/L (ref 101–111)
Creatinine, Ser: 0.88 mg/dL (ref 0.44–1.00)
GFR calc Af Amer: >60 mL/min (ref >60)
GFR calc non Af Amer: 53 mL/min — ABNORMAL LOW (ref >60)
Glucose, Bld: 61 mg/dL — ABNORMAL LOW (ref 65–99)
Potassium: 4 mmol/L (ref 3.5–5.1)
Sodium: 140 mmol/L (ref 135–145)
Total Bilirubin: 1 mg/dL (ref 0.3–1.2)
Total Protein: 5.7 g/dL — ABNORMAL LOW (ref 6.5–8.1)

## 2017-07-24 LAB — CBC WITH DIFFERENTIAL/PLATELET
Basophils Absolute: 0 10*3/uL (ref 0.0–0.1)
Basophils Relative: 0 %
Eosinophils Absolute: 0.1 10*3/uL (ref 0.0–0.7)
Eosinophils Relative: 1 %
HCT: 38 % (ref 36.0–46.0)
Hemoglobin: 12.6 g/dL (ref 12.0–15.0)
Lymphocytes Relative: 20 %
Lymphs Abs: 1.6 10*3/uL (ref 0.7–4.0)
MCH: 31 pg (ref 26.0–34.0)
MCHC: 33.2 g/dL (ref 30.0–36.0)
MCV: 93.6 fL (ref 78.0–100.0)
Monocytes Absolute: 0.9 10*3/uL (ref 0.1–1.0)
Monocytes Relative: 11 %
Neutro Abs: 5.5 10*3/uL (ref 1.7–7.7)
Neutrophils Relative %: 68 %
Platelets: 163 10*3/uL (ref 150–400)
RBC: 4.06 MIL/uL (ref 3.87–5.11)
RDW: 14.3 % (ref 11.5–15.5)
WBC: 8.1 10*3/uL (ref 4.0–10.5)

## 2017-07-24 MED ORDER — SODIUM CHLORIDE 0.9 % IV BOLUS (SEPSIS)
500.0000 mL | Freq: Once | INTRAVENOUS | Status: AC
  Administered 2017-07-24: 500 mL via INTRAVENOUS

## 2017-07-24 NOTE — Telephone Encounter (Signed)
LMOVM reminding pt to send remote transmission.   

## 2017-07-24 NOTE — ED Provider Notes (Signed)
[facility] Provider Note   CSN: [id number] Arrival date & time: 07/24/17  0102     History   Chief Complaint Chief Complaint  Patient presents with  . Sore Throat    HPI [name] is a 83 y.o. female.  HPI  83 year old female with extensive past medical history listed below presents to the emergency department with 2 weeks of sore throat and oral lesions. Reports that she was evaluated by her primary care provider on September 20 at which point some of the lesions had resolved. At that time patient was started on mouthwash including nystatin. Patient started developing a thick plaque on her tongue shortly after seeing her primary care doctor. Continues to complain of sore throat. Denies any trouble swallowing but does report odynophagia and has had decreased by mouth intake. Patient also complaining of nausea without vomiting. No chest pain or shortness of breath. No abdominal pain. No fevers, chills, or URI symptoms.  No other alleviating or aggravating factors.    In addition patient is also complaining of upper back muscular pain that is exacerbated with palpation and movement. Denies any traumas. No neck rigidity. No headache.  No other physical complaints.   Past Medical History:  Diagnosis Date  . Anemia   . Angina    on nitro-dur  . Coronary artery disease   . Elevated troponin   . GERD (gastroesophageal reflux disease)   . Glaucoma   . H/O myocardial perfusion scan 07/13/2011   The post stress left ventricle is normal in size. the rest left ventricle is normal in size. there is no scintigraphic evidence of inducible myocardial ishemia. The post-stress left ventricular function is normal. the post-stress ejection fraction is 66%.This is a low risk scan.  Marland Kitchen History of eye surgery    bilateral   . HOH (hard of hearing)   . Hyperlipidemia   . Hypertension   . Myocardial infarction (HCC)    x 3  . Pacemaker   . Rotator cuff tear    chronic  .  Shortness of breath    with exertion  . SSS (sick sinus syndrome) (HCC) 07/25/2016    Patient Active Problem List   Diagnosis Date Noted  . Impingement syndrome of left shoulder 09/15/2016  . SSS (sick sinus syndrome) (HCC) 07/25/2016  . Paroxysmal atrial fibrillation (HCC) 07/25/2016  . Elevated troponin 03/24/2016  . Left shoulder pain 03/24/2016  . Rotator cuff tear   . HOH (hard of hearing)   . Left arm pain   . Unstable angina (HCC) 11/18/2015  . Chest pain 12/17/2014  . CAD (coronary artery disease) 04/11/2013  . HTN (hypertension) 04/11/2013  . Pacemaker   . Hyperlipidemia     Past Surgical History:  Procedure Laterality Date  . APPENDECTOMY    . cardiac catherization  06/2010  . CARDIAC CATHETERIZATION    . CARDIAC CATHETERIZATION Left 07/19/2010   LV showed good LV systolic function, LVH EF of 60 % to 65 %. Left main was patent. LAD has 20 % to 30 % proximal stenosis and 99 % mid stenosis with TIMI 2 distal flow. Diagonal-1 was very, very small. Diagonal-2 was very small, Diagonal-3 was small which has 30 % to 40 % ostial stenosis just before the bifurcation with LAD stenosis. Left circumflex has 40 % to 50 % mid stenosis and 70 % to 75 % .  Marland Kitchen CORONARY ANGIOPLASTY WITH STENT PLACEMENT  02/13/2012  . HYSTEROSCOPY W/D&C  09/07/2011   Procedure: DILATATION  AND CURETTAGE (D&C) /HYSTEROSCOPY;  Surgeon: Turner Daniels, MD;  Location: [facility];  Service: Gynecology;  Laterality: N/A;  uterus  . INSERT / REPLACE / REMOVE PACEMAKER    . LEFT HEART CATHETERIZATION WITH CORONARY ANGIOGRAM N/A 01/25/2012   Procedure: LEFT HEART CATHETERIZATION WITH CORONARY ANGIOGRAM;  Surgeon: Robynn Pane, MD;  Location: [facility] CATH LAB;  Service: Cardiovascular;  Laterality: N/A;  . LEFT HEART CATHETERIZATION WITH CORONARY ANGIOGRAM N/A 12/18/2014   Procedure: LEFT HEART CATHETERIZATION WITH CORONARY ANGIOGRAM;  Surgeon: Robynn Pane, MD;  Location: [facility];  Service: Cardiovascular;  Laterality: N/A;   . PACEMAKER INSERTION  06/2010  . PERCUTANEOUS CORONARY STENT INTERVENTION (PCI-S) N/A 02/13/2012   Procedure: PERCUTANEOUS CORONARY STENT INTERVENTION (PCI-S);  Surgeon: Robynn Pane, MD;  Location: [facility] CATH LAB;  Service: Cardiovascular;  Laterality: N/A;  . svd     x 2  . THYROIDECTOMY      OB History    No data available       Home Medications    Prior to Admission medications   Medication Sig Start Date End Date Taking? Authorizing Provider  acetaminophen (TYLENOL) 500 MG tablet Take 500 mg by mouth every 4 (four) hours as needed for mild pain (pain).    Yes [provider]  amLODipine (NORVASC) 5 MG tablet Take 5 mg by mouth daily.   Yes [provider]  carvedilol (COREG) 12.5 MG tablet TAKE 1 TABLET BY MOUTH TWICE DAILY WITH A MEAL 04/02/17  Yes Croitoru, Mihai, MD  conjugated estrogens (PREMARIN) vaginal cream Place 1 Applicatorful vaginally 3 (three) times a week. Use at bedtime on Monday, Wednesday, Friday   Yes [provider]  Dorzolamide HCl-Timolol Mal PF (COSOPT PF) 22.3-6.8 MG/ML SOLN Place 1 drop into both eyes 2 (two) times daily.   Yes [provider]  famotidine (PEPCID) 20 MG tablet Take 20 mg by mouth 2 (two) times daily as needed for heartburn.    Yes [provider]  furosemide (LASIX) 20 MG tablet Take 20 mg by mouth daily. 05/09/14  Yes [provider]  gabapentin (NEURONTIN) 100 MG capsule Take 1 capsule (100 mg total) by mouth 3 (three) times daily as needed (pain). 03/26/16  Yes Kathlen Mody, MD  HYDROcodone-acetaminophen (NORCO/VICODIN) 5-325 MG tablet Take 0.5 tablets by mouth every 6 (six) hours as needed for moderate pain.   Yes [provider]  hydrocortisone (PROCTOZONE-HC) 2.5 % rectal cream Place 1 application rectally 2 (two) times daily as needed for hemorrhoids or itching.   Yes [provider]  lidocaine (XYLOCAINE) 5 % ointment Apply 1 application topically as needed for mild  pain (for rectal pain).   Yes [provider]  magic mouthwash w/lidocaine SOLN Take 5-10 mLs by mouth every 4 (four) hours as needed for mouth pain.   Yes [provider]  nitroGLYCERIN (NITRODUR - DOSED IN MG/24 HR) 0.2 mg/hr patch Place 0.2 mg onto the skin daily.   Yes [provider]  nitroGLYCERIN (NITROSTAT) 0.4 MG SL tablet Place 0.4 mg under the tongue every 5 (five) minutes as needed for chest pain.   Yes [provider]  prednisoLONE acetate (PRED FORTE) 1 % ophthalmic suspension Place 2 drops into the right eye 4 (four) times daily.    Yes [provider]  ramipril (ALTACE) 10 MG capsule Take 10 mg by mouth daily.    Yes [provider]  aspirin EC 81 MG tablet Take 81 mg by  mouth daily.    [provider]  atorvastatin (LIPITOR) 10 MG tablet Take 10 mg by mouth daily.    [provider]  folic acid (FOLVITE) 1 MG tablet Take 1 tablet (1 mg total) by mouth daily. 09/15/15   Croitoru, Mihai, MD  nitroGLYCERIN (NITRODUR - DOSED IN MG/24 HR) 0.4 mg/hr patch Place 0.4 mg onto the skin every morning. REMOVE EVERY NIGHT    [provider]  ondansetron (ZOFRAN ODT) 4 MG disintegrating tablet Take 1 tablet (4 mg total) by mouth every 8 (eight) hours as needed for nausea or vomiting. 03/27/16   Vanetta Mulders, MD  Tafluprost (ZIOPTAN) 0.0015 % SOLN Place 1 drop into the right eye at bedtime.    [provider]    Family History Family History  Problem Relation Age of Onset  . Diabetes Mother   . Cancer Sister     Social History Social History  Substance Use Topics  . Smoking status: Former Smoker    Packs/day: 0.50    Types: Cigarettes    Quit date: 09/05/1991  . Smokeless tobacco: Never Used  . Alcohol use No     Comment: none since 1992     Allergies   Influenza vac split [flu virus vaccine]; Tramadol; and Shellfish allergy   Review of Systems Review of Systems All other systems are  reviewed and are negative for acute change except as noted in the HPI   Physical Exam Updated Vital Signs BP (!) 156/86   Pulse 70   Temp 97.6 F (36.4 C) (Oral)   Resp 16   SpO2 98%   Physical Exam  Constitutional: She is oriented to person, place, and time. She appears well-developed and well-nourished. No distress.  Thin.  HENT:  Head: Normocephalic and atraumatic.  Nose: Nose normal.  Eyes: Pupils are equal, round, and reactive to light. Conjunctivae and EOM are normal. Right eye exhibits no discharge. Left eye exhibits no discharge. No scleral icterus.  Neck: Normal range of motion. Neck supple. Muscular tenderness present. No spinous process tenderness present. No thyromegaly present.    Cardiovascular: Normal rate and regular rhythm.  Exam reveals no gallop and no friction rub.   No murmur heard. Pulmonary/Chest: Effort normal and breath sounds normal. No stridor. No respiratory distress. She has no rales.  Abdominal: Soft. She exhibits no distension. There is no tenderness.  Musculoskeletal: She exhibits no edema.       Cervical back: She exhibits tenderness.       Back:  Neurological: She is alert and oriented to person, place, and time.  Skin: Skin is warm and dry. No rash noted. She is not diaphoretic. No erythema.  Psychiatric: She has a normal mood and affect.  Vitals reviewed.    ED Treatments / Results  Labs (all labs ordered are listed, but only abnormal results are displayed) Labs Reviewed  COMPREHENSIVE METABOLIC PANEL - Abnormal; Notable for the following:       Result Value   CO2 21 (*)    Glucose, Bld 61 (*)    Total Protein 5.7 (*)    ALT 11 (*)    GFR calc non Af Amer 53 (*)    All other components within normal limits  CBC WITH DIFFERENTIAL/PLATELET    EKG  EKG Interpretation None       Radiology No results found.  Procedures Procedures (including critical care time)  Medications Ordered in ED Medications  sodium chloride 0.9  % bolus 500 mL (  0 mLs Intravenous Stopped 07/24/17 1417)     Initial Impression / Assessment and Plan / ED Course  I have reviewed the triage vital signs and the nursing notes.  Pertinent labs & imaging results that were available during my care of the patient were reviewed by me and considered in my medical decision making (see chart for details).     Consistent with thrush. No evidence of pharyngitis. Peritonsillar abscess. Submandibular or deep tissue abscess.  Due to patient's lack of oral intake recently, screening labs are obtained which are grossly reassuring. Patient was provided with IV hydration. Patient reported significant improvement in her fatigue.  Patient is already on nystatin. Recommended continuing this regimen and following up closely with her primary care provider  Additionally patient was complaining of muscular pain of the upper fibers of bilateral trapezius and neck muscles. No infectious symptoms concerning for meningitis. Low suspicion for cardiac etiology.   Final Clinical Impressions(s) / ED Diagnoses   Final diagnoses:  Thrush   Disposition: Discharge  Condition: Good  I have discussed the results, Dx and Tx plan with the patient and familywho expressed understanding and agree(s) with the plan. Discharge instructions discussed at great length. The patient and family were given strict return precautions who verbalized understanding of the instructions. No further questions at time of discharge.    Discharge Medication List as of 07/24/2017  4:42 PM      Follow Up: Clovis Riley, L.August Saucer, MD [address] [place] [address] [phone]   in 2-3 days, If symptoms do not improve or  worsen      Roye Gustafson, Amadeo Garnet, MD 07/24/17 [phone]

## 2017-07-24 NOTE — ED Triage Notes (Signed)
Mouth pain and throat pain  X 2 weeks states feels like tongue is peeling had bumps on her tongue , saw her dr and given magic mouth wash rx on the 20 but pain has not subsided

## 2017-07-24 NOTE — ED Notes (Signed)
IV team at bedside 

## 2017-07-25 ENCOUNTER — Telehealth: Payer: Self-pay | Admitting: Cardiovascular Disease

## 2017-07-25 NOTE — Telephone Encounter (Signed)
New  Message  Pt device is beeping. Please call back to discuss

## 2017-07-25 NOTE — Telephone Encounter (Signed)
Spoke with granddaughter- helped to send transmission. We had to disconnect the call to get the transmission to send over the landline phone. She verbalizes understanding.

## 2017-07-25 NOTE — Progress Notes (Signed)
Remote pacemaker transmission.   

## 2017-07-26 ENCOUNTER — Encounter: Payer: Self-pay | Admitting: Cardiology

## 2017-07-27 LAB — CUP PACEART REMOTE DEVICE CHECK
Date Time Interrogation Session: 20180928110823
Implantable Lead Implant Date: 20110923
Implantable Lead Implant Date: 20110923
Implantable Lead Location: 753859
Implantable Lead Location: 753860
Implantable Pulse Generator Implant Date: 20110923
Pulse Gen Model: 2210
Pulse Gen Serial Number: 2561895

## 2017-09-12 ENCOUNTER — Encounter: Payer: Self-pay | Admitting: Cardiovascular Disease

## 2017-09-12 ENCOUNTER — Ambulatory Visit: Payer: Medicare Other | Admitting: Cardiovascular Disease

## 2017-09-12 VITALS — BP 120/60 | HR 72 | Ht 62.0 in | Wt 111.0 lb

## 2017-09-12 DIAGNOSIS — Z950 Presence of cardiac pacemaker: Secondary | ICD-10-CM | POA: Diagnosis not present

## 2017-09-12 DIAGNOSIS — I495 Sick sinus syndrome: Secondary | ICD-10-CM

## 2017-09-12 DIAGNOSIS — I480 Paroxysmal atrial fibrillation: Secondary | ICD-10-CM | POA: Diagnosis not present

## 2017-09-12 DIAGNOSIS — I2510 Atherosclerotic heart disease of native coronary artery without angina pectoris: Secondary | ICD-10-CM

## 2017-09-12 DIAGNOSIS — E7800 Pure hypercholesterolemia, unspecified: Secondary | ICD-10-CM

## 2017-09-12 DIAGNOSIS — I10 Essential (primary) hypertension: Secondary | ICD-10-CM | POA: Diagnosis not present

## 2017-09-12 NOTE — Progress Notes (Signed)
Cardiology Office Note    Date:  09/14/2017   ID:  [name], DOB 07/16/1918, MRN [id number]  PCP:  Asencion GowdaMitchell, L.August Saucerean, MD  Cardiologist:  Rinaldo CloudMohan Harwani, MD; Thurmon FairMihai Emilynn Srinivasan, MD   Chief Complaint  Patient presents with  . Follow-up    CHEST PAIN AND COMES AND GOES    History of Present Illness:  [name] is a 83 y.o. female with sinus node dysfunction and AV conduction disease presenting for pacemaker follow-up. She also has paroxysmal atrial fibrillation, coronary artery disease s/p her septal infarction and LAD stent in 2011, stent to left circumflex in 2013, hyperlipidemia and hypertension for which she is followed by Dr. Sharyn LullHarwani.  Roses vision has deteriorated and with this she is suffering a lot more anxiety than she did in the past.  She has no cardiac complaints.  She denies edema, orthopnea or PND.  Her activity level has decreased further with her vision problems.  She has had some right-sided chest pain that is clearly positional.  It is worse if she bends forward and also worse if she lays back.  It sounds like it might be radiating radicular pain from a possible small vertebral compression fracture.  She remains very unsteady on her feet and although she has not had any serious falls has stumbled many times.  She is in atrial fibrillation (A. fib, V paced) today but appears to be completely unaware of the arrhythmia.  There has been an increase in atrial fibrillation burden recently and she has essentially spent all of September and October and atrial fibrillation, but has converted back to normal rhythm throughout most of November until the last 6 days.  She is now in persistent atrial fibrillation for 6 days.  The overall burden of atrial fibrillation remains low at 5.7% over the last year.  When not in atrial fibrillation she has 78% atrial pacing and 72% ventricular pacing.  Her underlying rhythm today is atrial fibrillation with slow ventricular response with a rate  of 35-45 bpm.  Her current pacemaker battery is estimated to last round of 4.5-5 years. Her pacemaker is a [employer] accent DR RF device was implanted in 2011.   Past Medical History:  Diagnosis Date  . Anemia   . Angina    on nitro-dur  . Coronary artery disease   . Elevated troponin   . GERD (gastroesophageal reflux disease)   . Glaucoma   . H/O myocardial perfusion scan 07/13/2011   The post stress left ventricle is normal in size. the rest left ventricle is normal in size. there is no scintigraphic evidence of inducible myocardial ishemia. The post-stress left ventricular function is normal. the post-stress ejection fraction is 66%.This is a low risk scan.  Marland Kitchen. History of eye surgery    bilateral   . HOH (hard of hearing)   . Hyperlipidemia   . Hypertension   . Myocardial infarction (HCC)    x 3  . Pacemaker   . Rotator cuff tear    chronic  . Shortness of breath    with exertion  . SSS (sick sinus syndrome) (HCC) 07/25/2016    Past Surgical History:  Procedure Laterality Date  . APPENDECTOMY    . cardiac catherization  06/2010  . CARDIAC CATHETERIZATION    . CARDIAC CATHETERIZATION Left 07/19/2010   LV showed good LV systolic function, LVH EF of 60 % to 65 %. Left main was patent. LAD has 20 % to 30 % proximal stenosis  and 99 % mid stenosis with TIMI 2 distal flow. Diagonal-1 was very, very small. Diagonal-2 was very small, Diagonal-3 was small which has 30 % to 40 % ostial stenosis just before the bifurcation with LAD stenosis. Left circumflex has 40 % to 50 % mid stenosis and 70 % to 75 % .  Marland Kitchen CORONARY ANGIOPLASTY WITH STENT PLACEMENT  02/13/2012  . DILATATION AND CURETTAGE /HYSTEROSCOPY N/A 09/07/2011   Performed by Turner Daniels, MD at [facility] ORS  . INSERT / REPLACE / REMOVE PACEMAKER    . LEFT HEART CATHETERIZATION WITH CORONARY ANGIOGRAM N/A 12/18/2014   Performed by Rinaldo Cloud, MD at [facility] CATH LAB  . LEFT HEART CATHETERIZATION WITH CORONARY ANGIOGRAM N/A 01/25/2012    Performed by Robynn Pane, MD at [facility] CATH LAB  . PACEMAKER INSERTION  06/2010  . PERCUTANEOUS CORONARY STENT INTERVENTION (PCI-S) N/A 02/13/2012   Performed by Robynn Pane, MD at [facility] CATH LAB  . svd     x 2  . THYROIDECTOMY      Current Medications: Outpatient Medications Prior to Visit  Medication Sig Dispense Refill  . acetaminophen (TYLENOL) 500 MG tablet Take 500 mg by mouth every 4 (four) hours as needed for mild pain (pain).     Marland Kitchen amLODipine (NORVASC) 5 MG tablet Take 5 mg by mouth daily.    . carvedilol (COREG) 12.5 MG tablet TAKE 1 TABLET BY MOUTH TWICE DAILY WITH A MEAL 180 tablet 3  . conjugated estrogens (PREMARIN) vaginal cream Place 1 Applicatorful vaginally 3 (three) times a week. Use at bedtime on Monday, Wednesday, Friday    . Dorzolamide HCl-Timolol Mal PF (COSOPT PF) 22.3-6.8 MG/ML SOLN Place 1 drop into both eyes 2 (two) times daily.    . famotidine (PEPCID) 20 MG tablet Take 20 mg by mouth 2 (two) times daily as needed for heartburn.     . furosemide (LASIX) 20 MG tablet Take 20 mg by mouth daily.    Marland Kitchen gabapentin (NEURONTIN) 100 MG capsule Take 1 capsule (100 mg total) by mouth 3 (three) times daily as needed (pain). 90 capsule 0  . HYDROcodone-acetaminophen (NORCO/VICODIN) 5-325 MG tablet Take 0.5 tablets by mouth every 6 (six) hours as needed for moderate pain.    . hydrocortisone (PROCTOZONE-HC) 2.5 % rectal cream Place 1 application rectally 2 (two) times daily as needed for hemorrhoids or itching.    . lidocaine (XYLOCAINE) 5 % ointment Apply 1 application topically as needed for mild pain (for rectal pain).    . magic mouthwash w/lidocaine SOLN Take 5-10 mLs by mouth every 4 (four) hours as needed for mouth pain.    . nitroGLYCERIN (NITRODUR - DOSED IN MG/24 HR) 0.2 mg/hr patch Place 0.2 mg onto the skin daily.    . nitroGLYCERIN (NITRODUR - DOSED IN MG/24 HR) 0.4 mg/hr patch Place 0.4 mg onto the skin every morning. REMOVE EVERY NIGHT    . ondansetron (ZOFRAN  ODT) 4 MG disintegrating tablet Take 1 tablet (4 mg total) by mouth every 8 (eight) hours as needed for nausea or vomiting. 12 tablet 2  . prednisoLONE acetate (PRED FORTE) 1 % ophthalmic suspension Place 2 drops into the right eye 4 (four) times daily.     . ramipril (ALTACE) 10 MG capsule Take 10 mg by mouth daily.     . Tafluprost (ZIOPTAN) 0.0015 % SOLN Place 1 drop into the right eye at bedtime.    . nitroGLYCERIN (NITROSTAT) 0.4 MG SL tablet Place 0.4 mg  under the tongue every 5 (five) minutes as needed for chest pain.    Marland Kitchen aspirin EC 81 MG tablet Take 81 mg by mouth daily.    Marland Kitchen atorvastatin (LIPITOR) 10 MG tablet Take 10 mg by mouth daily.    . folic acid (FOLVITE) 1 MG tablet Take 1 tablet (1 mg total) by mouth daily. (Patient not taking: Reported on 09/12/2017) 90 tablet 2   No facility-administered medications prior to visit.      Allergies:   Influenza vac split [flu virus vaccine]; Tramadol; and Shellfish allergy   Social History   Socioeconomic History  . Marital status: Widowed    Spouse name: None  . Number of children: 2  . Years of education: None  . Highest education level: None  Social Needs  . Financial resource strain: None  . Food insecurity - worry: None  . Food insecurity - inability: None  . Transportation needs - medical: None  . Transportation needs - non-medical: None  Occupational History  . Occupation: retired  Tobacco Use  . Smoking status: Former Smoker    Packs/day: 0.50    Types: Cigarettes    Last attempt to quit: 09/05/1991    Years since quitting: 26.0  . Smokeless tobacco: Never Used  Substance and Sexual Activity  . Alcohol use: No    Comment: none since 1992  . Drug use: No  . Sexual activity: Not Currently  Other Topics Concern  . None  Social History Narrative  . None     Family History:  The patient's family history includes Cancer in her sister; Diabetes in her mother.   ROS:   Please see the history of present illness.      ROS All other systems reviewed and are negative.   PHYSICAL EXAM:   VS:  BP 120/60 (BP Location: Left Arm)   Pulse 72   Ht 5\' 2"  (1.575 m)   Wt 111 lb (50.3 kg)   BMI 20.30 kg/m     General: Alert, oriented x3, no distress, very elderly, thin and frail appearing Head: no evidence of trauma, PERRL, EOMI, no exophtalmos or lid lag, no myxedema, no xanthelasma; normal ears, nose and oropharynx Neck: normal jugular venous pulsations and no hepatojugular reflux; brisk carotid pulses without delay and no carotid bruits Chest: clear to auscultation, no signs of consolidation by percussion or palpation, normal fremitus, symmetrical and full respiratory excursions Cardiovascular: normal position and quality of the apical impulse, regular rhythm, normal first and paradoxically split second heart sounds, no murmurs, rubs or gallops.  The pacemaker sticks out because she is so slender, but at the site looks healthy. Abdomen: no tenderness or distention, no masses by palpation, no abnormal pulsatility or arterial bruits, normal bowel sounds, no hepatosplenomegaly Extremities: no clubbing, cyanosis or edema; 2+ radial, ulnar and brachial pulses bilaterally; 2+ right femoral, posterior tibial and dorsalis pedis pulses; 2+ left femoral, posterior tibial and dorsalis pedis pulses; no subclavian or femoral bruits Neurological: grossly nonfocal Psych: Normal mood and affect   Wt Readings from Last 3 Encounters:  09/12/17 111 lb (50.3 kg)  07/25/16 119 lb 3.2 oz (54.1 kg)  03/26/16 119 lb 12.8 oz (54.3 kg)      Studies/Labs Reviewed:   EKG:  EKG is ordered today.  The ekg ordered today demonstrates background atrial fibrillation with 100% ventricular paced rhythm, QTC 479 ms  Recent Labs: 07/24/2017: ALT 11; BUN 13; Creatinine, Ser 0.88; Hemoglobin 12.6; Platelets 163; Potassium 4.0; Sodium 140  09/12/2017: TSH 1.530   Lipid Panel    Component Value Date/Time   CHOL 211 (H) 03/24/2016 0627    TRIG 60 03/24/2016 0627   HDL 74 03/24/2016 0627   CHOLHDL 2.9 03/24/2016 0627   VLDL 12 03/24/2016 0627   LDLCALC 125 (H) 03/24/2016 16100627     ASSESSMENT:    1. Paroxysmal atrial fibrillation (HCC)   2. SSS (sick sinus syndrome) (HCC)   3. Pacemaker   4. Coronary artery disease involving native coronary artery of native heart without angina pectoris   5. Hypercholesterolemia   6. Essential hypertension      PLAN:  In order of problems listed above:  1. AFib: There has been a marked increase in the burden of atrial fibrillation.  She has not been on anticoagulation now for several months.  This was stopped when she was having some orthopedic problems earlier this year, but she is also a patient at risk for bleeding.  We will leave the decision regarding restarting anticoagulation to her primary cardiologist, Dr. Sharyn LullHarwani.  She does not have rapid ventricular rates.  Continue carvedilol.  Clinically she does not appear to be hyperthyroid, but will recommend a TSH to make sure that hyperthyroidism is not the reason for the increased burden of atrial fibrillation. 2. SSS: High burden of atrial pacing when not in atrial fibrillation. 3. PPM: Normal device function.  Recheck remote download in 3 months. 4. CAD: She has some chest pain that sounds like possible radicular pain from vertebral compression or other type of radiculopathy.  No symptoms that suggest angina. 5. HLP: No longer on statin.  I am not sure why. 6. HTN: Good blood pressure control    Medication Adjustments/Labs and Tests Ordered: Current medicines are reviewed at length with the patient today.  Concerns regarding medicines are outlined above.  Medication changes, Labs and Tests ordered today are listed in the Patient Instructions below. Patient Instructions  Dr Royann Shiversroitoru recommends that you continue on your current medications as directed. Please refer to the Current Medication list given to you today.  Your physician  recommends that you return for lab work TODAY.  Remote monitoring is used to monitor your Pacemaker of ICD from home. This monitoring reduces the number of office visits required to check your device to one time per year. It allows us to keep an eye on the functioning of your device to ensure it is working properly. You are scheduled for a device check from home on Wednesday, December 26th, 2018. You may send your transmission at any time that day. If you have a wireless device, the transmission will be sent automatically. After your physician reviews your transmission, you will receive a postcard with your next transmission date.  Dr Royann Shiversroitoru recommends that you schedule a follow-up appointment in 6 months with a pacemaker check. You will receive a reminder letter in the mail two months in advance. If you don't receive a letter, please call our office to schedule the follow-up appointment.  If you need a refill on your cardiac medications before your next appointment, please call your pharmacy.    Signed, Thurmon FairMihai Lynise Porr, MD  09/14/2017 6:22 PM    [facility] [address], [place], [place]  [address] Phone: [phone]; Fax: [phone]

## 2017-09-12 NOTE — Patient Instructions (Signed)
Dr Royann Shiversroitoru recommends that you continue on your current medications as directed. Please refer to the Current Medication list given to you today.  Your physician recommends that you return for lab work TODAY.  Remote monitoring is used to monitor your Pacemaker of ICD from home. This monitoring reduces the number of office visits required to check your device to one time per year. It allows us to keep an eye on the functioning of your device to ensure it is working properly. You are scheduled for a device check from home on Wednesday, December 26th, 2018. You may send your transmission at any time that day. If you have a wireless device, the transmission will be sent automatically. After your physician reviews your transmission, you will receive a postcard with your next transmission date.  Dr Royann Shiversroitoru recommends that you schedule a follow-up appointment in 6 months with a pacemaker check. You will receive a reminder letter in the mail two months in advance. If you don't receive a letter, please call our office to schedule the follow-up appointment.  If you need a refill on your cardiac medications before your next appointment, please call your pharmacy.

## 2017-09-13 ENCOUNTER — Other Ambulatory Visit: Payer: Self-pay | Admitting: Cardiovascular Disease

## 2017-09-13 LAB — TSH: TSH: 1.53 u[IU]/mL (ref 0.450–4.500)

## 2017-09-14 ENCOUNTER — Encounter: Payer: Self-pay | Admitting: Cardiovascular Disease

## 2017-10-24 ENCOUNTER — Encounter: Payer: Medicare Other | Admitting: *Deleted

## 2017-10-24 ENCOUNTER — Telehealth: Payer: Self-pay | Admitting: Cardiology

## 2017-10-24 LAB — CUP PACEART INCLINIC DEVICE CHECK
Battery Remaining Longevity: 52 mo
Battery Voltage: 2.86 V
Brady Statistic RA Percent Paced: 78 %
Brady Statistic RV Percent Paced: 72 %
Date Time Interrogation Session: 20181114175000
Implantable Lead Implant Date: 20110923
Implantable Lead Implant Date: 20110923
Implantable Lead Location: 753859
Implantable Lead Location: 753860
Implantable Pulse Generator Implant Date: 20110923
Lead Channel Impedance Value: 475 Ohm
Lead Channel Impedance Value: 487.5 Ohm
Lead Channel Pacing Threshold Amplitude: 0.5 V
Lead Channel Pacing Threshold Amplitude: 0.75 V
Lead Channel Pacing Threshold Amplitude: 0.75 V
Lead Channel Pacing Threshold Pulse Width: 0.4 ms
Lead Channel Pacing Threshold Pulse Width: 0.4 ms
Lead Channel Pacing Threshold Pulse Width: 0.4 ms
Lead Channel Sensing Intrinsic Amplitude: 1.1 mV
Lead Channel Sensing Intrinsic Amplitude: 12 mV
Lead Channel Setting Pacing Amplitude: 0.875
Lead Channel Setting Pacing Amplitude: 2 V
Lead Channel Setting Pacing Pulse Width: 0.4 ms
Lead Channel Setting Sensing Sensitivity: 2 mV
Pulse Gen Model: 2210
Pulse Gen Serial Number: 2561895

## 2017-10-24 NOTE — Telephone Encounter (Signed)
LMOVM reminding pt to send remote transmission.   

## 2017-10-25 ENCOUNTER — Encounter: Payer: Self-pay | Admitting: Cardiology

## 2018-09-12 ENCOUNTER — Ambulatory Visit (INDEPENDENT_AMBULATORY_CARE_PROVIDER_SITE_OTHER): Payer: Medicare Other | Admitting: Cardiovascular Disease

## 2018-09-12 ENCOUNTER — Encounter: Payer: Self-pay | Admitting: Cardiovascular Disease

## 2018-09-12 VITALS — BP 116/62 | HR 70 | Ht 62.0 in | Wt 102.6 lb

## 2018-09-12 DIAGNOSIS — I2510 Atherosclerotic heart disease of native coronary artery without angina pectoris: Secondary | ICD-10-CM | POA: Diagnosis not present

## 2018-09-12 DIAGNOSIS — I4811 Longstanding persistent atrial fibrillation: Secondary | ICD-10-CM | POA: Diagnosis not present

## 2018-09-12 DIAGNOSIS — Z950 Presence of cardiac pacemaker: Secondary | ICD-10-CM

## 2018-09-12 DIAGNOSIS — I10 Essential (primary) hypertension: Secondary | ICD-10-CM

## 2018-09-12 NOTE — Patient Instructions (Signed)
Medication Instructions:  Dr Royann Shiversroitoru recommends that you continue on your current medications as directed. Please refer to the Current Medication list given to you today.  If you need a refill on your cardiac medications before your next appointment, please call your pharmacy.   Testing/Procedures: Remote monitoring is used to monitor your Pacemaker of ICD from home. This monitoring reduces the number of office visits required to check your device to one time per year. It allows us to keep an eye on the functioning of your device to ensure it is working properly. You are scheduled for a device check from home on Thursday, February 20th, 2020. You may send your transmission at any time that day. If you have a wireless device, the transmission will be sent automatically. After your physician reviews your transmission, you will receive a postcard with your next transmission date.  Follow-Up: At [facility], you and your health needs are our priority.  As part of our continuing mission to provide you with exceptional heart care, we have created designated Provider Care Teams.  These Care Teams include your primary Cardiologist (physician) and Advanced Practice Providers (APPs -  Physician Assistants and Nurse Practitioners) who all work together to provide you with the care you need, when you need it. You will need a follow up appointment in 12 months.  Please call our office 2 months in advance to schedule this appointment.  You may see Thurmon FairMihai Croitoru, MD or one of the following Advanced Practice Providers on your designated Care Team: ChugcreekHao Meng, [place]PA-C . Micah FlesherAngela Duke, PA-C

## 2018-09-12 NOTE — Progress Notes (Signed)
Cardiology Office Note    Date:  09/13/2018   ID:  Amy FramesRosa L [name], DOB 07/16/1918, MRN [id number]  PCP:  Asencion GowdaMitchell, L.August Saucerean, MD  Cardiologist:  Rinaldo CloudMohan Harwani, MD; Thurmon FairMihai Khup Sapia, MD   Chief Complaint  Patient presents with  . Pacemaker Check    History of Present Illness:  [name] is a 84 y.o. female with sinus node dysfunction and AV conduction disease presenting for pacemaker follow-up. She also has paroxysmal atrial fibrillation, coronary artery disease s/p her septal infarction and LAD stent in 2011, stent to left circumflex in 2013, hyperlipidemia and hypertension for which she is followed by Dr. Sharyn LullHarwani.  Since her last appointment Ms. [name] has completely lost her vision.  In parallel with her deteriorating hearing, this has made her more isolated and withdrawn.  She is understandably a little depressed.  Her food intake has decreased and she is underweight.  She complains of a mid epigastric discomfort that improves with Tylenol.  Pacemaker interrogation shows that she has been in uninterrupted atrial fibrillation for about a year.  At times there is some undersensing of the arrhythmia, but I do not think she has truly had any interruption in the atrial fibrillation.  Rate control is not an issue, she is always paced in the ventricle.  The underlying rhythm is atrial fibrillation with very slow response at about 35-40 bpm.  Reprogrammed her device VVIR today which led to a little increase in estimated generator longevity, now 2.9-3.2 years.  We also applied the cyber security upgrade today.. Her pacemaker is a [employer] accent DR RF device was implanted in 2011.   Past Medical History:  Diagnosis Date  . Anemia   . Angina    on nitro-dur  . Coronary artery disease   . Elevated troponin   . GERD (gastroesophageal reflux disease)   . Glaucoma   . H/O myocardial perfusion scan 07/13/2011   The post stress left ventricle is normal in size. the rest left ventricle is normal  in size. there is no scintigraphic evidence of inducible myocardial ishemia. The post-stress left ventricular function is normal. the post-stress ejection fraction is 66%.This is a low risk scan.  [name]. History of eye surgery    bilateral   . HOH (hard of hearing)   . Hyperlipidemia   . Hypertension   . Myocardial infarction (HCC)    x 3  . Pacemaker   . Rotator cuff tear    chronic  . Shortness of breath    with exertion  . SSS (sick sinus syndrome) (HCC) 07/25/2016    Past Surgical History:  Procedure Laterality Date  . APPENDECTOMY    . cardiac catherization  06/2010  . CARDIAC CATHETERIZATION    . CARDIAC CATHETERIZATION Left 07/19/2010   LV showed good LV systolic function, LVH EF of 60 % to 65 %. Left main was patent. LAD has 20 % to 30 % proximal stenosis and 99 % mid stenosis with TIMI 2 distal flow. Diagonal-1 was very, very small. Diagonal-2 was very small, Diagonal-3 was small which has 30 % to 40 % ostial stenosis just before the bifurcation with LAD stenosis. Left circumflex has 40 % to 50 % mid stenosis and 70 % to 75 % .  [name]. CORONARY ANGIOPLASTY WITH STENT PLACEMENT  02/13/2012  . HYSTEROSCOPY W/D&C  09/07/2011   Procedure: DILATATION AND CURETTAGE (D&C) /HYSTEROSCOPY;  Surgeon: Turner Danielsavid C Lowe, MD;  Location: [facility];  Service: Gynecology;  Laterality: N/A;  uterus  .  INSERT / REPLACE / REMOVE PACEMAKER    . LEFT HEART CATHETERIZATION WITH CORONARY ANGIOGRAM N/A 01/25/2012   Procedure: LEFT HEART CATHETERIZATION WITH CORONARY ANGIOGRAM;  Surgeon: Robynn Pane, MD;  Location: [facility] CATH LAB;  Service: Cardiovascular;  Laterality: N/A;  . LEFT HEART CATHETERIZATION WITH CORONARY ANGIOGRAM N/A 12/18/2014   Procedure: LEFT HEART CATHETERIZATION WITH CORONARY ANGIOGRAM;  Surgeon: Robynn Pane, MD;  Location: [facility] CATH LAB;  Service: Cardiovascular;  Laterality: N/A;  . PACEMAKER INSERTION  06/2010  . PERCUTANEOUS CORONARY STENT INTERVENTION (PCI-S) N/A 02/13/2012   Procedure: PERCUTANEOUS  CORONARY STENT INTERVENTION (PCI-S);  Surgeon: Robynn Pane, MD;  Location: [facility] C[facility] LAB;  Service: Cardiovascular;  Laterality: N/A;  . svd     x 2  . THYROIDECTOMY      Current Medications: Outpatient Medications Prior to Visit  Medication Sig Dispense Refill  . acetaminophen (TYLENOL) 500 MG tablet Take 500 mg by mouth every 4 (four) hours as needed for mild pain (pain).     [name] amLODipine (NORVASC) 5 MG tablet Take 5 mg by mouth daily.    . carvedilol (COREG) 12.5 MG tablet TAKE 1 TABLET BY MOUTH TWICE DAILY WITH A MEAL 180 tablet 3  . conjugated estrogens (PREMARIN) vaginal cream Place 1 Applicatorful vaginally 3 (three) times a week. Use at bedtime on Monday, Wednesday, Friday    . Dorzolamide HCl-Timolol Mal PF (COSOPT PF) 22.3-6.8 MG/ML SOLN Place 1 drop into both eyes 2 (two) times daily.    . famotidine (PEPCID) 20 MG tablet Take 20 mg by mouth 2 (two) times daily as needed for heartburn.     . furosemide (LASIX) 20 MG tablet Take 20 mg by mouth daily.    [name] gabapentin (NEURONTIN) 100 MG capsule Take 1 capsule (100 mg total) by mouth 3 (three) times daily as needed (pain). 90 capsule 0  . HYDROcodone-acetaminophen (NORCO/VICODIN) 5-325 MG tablet Take 0.5 tablets by mouth every 6 (six) hours as needed for moderate pain.    . hydrocortisone (PROCTOZONE-HC) 2.5 % rectal cream Place 1 application rectally 2 (two) times daily as needed for hemorrhoids or itching.    . lidocaine (XYLOCAINE) 5 % ointment Apply 1 application topically as needed for mild pain (for rectal pain).    . nitroGLYCERIN (NITRODUR - DOSED IN MG/24 HR) 0.4 mg/hr patch Place 0.4 mg onto the skin every morning. REMOVE EVERY NIGHT    . ondansetron (ZOFRAN ODT) 4 MG disintegrating tablet Take 1 tablet (4 mg total) by mouth every 8 (eight) hours as needed for nausea or vomiting. 12 tablet 2  . prednisoLONE acetate (PRED FORTE) 1 % ophthalmic suspension Place 2 drops into the right eye 4 (four) times daily.     . ramipril  (ALTACE) 10 MG capsule Take 10 mg by mouth daily.     . Tafluprost (ZIOPTAN) 0.0015 % SOLN Place 1 drop into the right eye at bedtime.    . magic mouthwash w/lidocaine SOLN Take 5-10 mLs by mouth every 4 (four) hours as needed for mouth pain.    . nitroGLYCERIN (NITRODUR - DOSED IN MG/24 HR) 0.2 mg/hr patch Place 0.2 mg onto the skin daily.    . nitroGLYCERIN (NITROSTAT) 0.4 MG SL tablet Place 0.4 mg under the tongue every 5 (five) minutes as needed for chest pain.     No facility-administered medications prior to visit.      Allergies:   Influenza vac split [flu virus vaccine]; Tramadol; and Shellfish allergy   Social  History   Socioeconomic History  . Marital status: Widowed    Spouse name: Not on file  . Number of children: 2  . Years of education: Not on file  . Highest education level: Not on file  Occupational History  . Occupation: retired  Engineer, production  . Financial resource strain: Not on file  . Food insecurity:    Worry: Not on file    Inability: Not on file  . Transportation needs:    Medical: Not on file    Non-medical: Not on file  Tobacco Use  . Smoking status: Former Smoker    Packs/day: 0.50    Types: Cigarettes    Last attempt to quit: 09/05/1991    Years since quitting: 27.0  . Smokeless tobacco: Never Used  Substance and Sexual Activity  . Alcohol use: No    Comment: none since 1992  . Drug use: No  . Sexual activity: Not Currently  Lifestyle  . Physical activity:    Days per week: Not on file    Minutes per session: Not on file  . Stress: Not on file  Relationships  . Social connections:    Talks on phone: Not on file    Gets together: Not on file    Attends religious service: Not on file    Active member of club or organization: Not on file    Attends meetings of clubs or organizations: Not on file    Relationship status: Not on file  Other Topics Concern  . Not on file  Social History Narrative  . Not on file     Family History:  The  patient's family history includes Cancer in her sister; Diabetes in her mother.   ROS:   Please see the history of present illness.    ROS all of the other systems are reviewed and are negative  PHYSICAL EXAM:   VS:  BP 116/62   Pulse 70   Ht 5\' 2"  (1.575 m)   Wt 102 lb 9.6 oz (46.5 kg)   BMI 18.77 kg/m      General: Alert, oriented x3, no distress, she appears very elderly and frail.  Left subclavian pacemaker site looks healthy. Head: no evidence of trauma, PERRL, EOMI, no exophtalmos or lid lag, no myxedema, no xanthelasma; normal ears, nose and oropharynx Neck: normal jugular venous pulsations and no hepatojugular reflux; brisk carotid pulses without delay and no carotid bruits Chest: clear to auscultation, no signs of consolidation by percussion or palpation, normal fremitus, symmetrical and full respiratory excursions Cardiovascular: normal position and quality of the apical impulse, regular rhythm, normal first and paradoxically split second heart sounds, no murmurs, rubs or gallops Abdomen: no tenderness or distention, no masses by palpation, no abnormal pulsatility or arterial bruits, normal bowel sounds, no hepatosplenomegaly Extremities: no clubbing, cyanosis or edema; 2+ radial, ulnar and brachial pulses bilaterally; 2+ right femoral, posterior tibial and dorsalis pedis pulses; 2+ left femoral, posterior tibial and dorsalis pedis pulses; no subclavian or femoral bruits Neurological: Blind, very poor hearing Psych: Normal mood and affect   Wt Readings from Last 3 Encounters:  09/12/18 102 lb 9.6 oz (46.5 kg)  09/12/17 111 lb (50.3 kg)  07/25/16 119 lb 3.2 oz (54.1 kg)      Studies/Labs Reviewed:   EKG:  EKG is ordered today.  The ekg ordered today demonstrates background atrial fibrillation with 100% ventricular paced rhythm, QTC 479 ms  Recent Labs: No results found for requested labs within last  8760 hours.   Lipid Panel    Component Value Date/Time   CHOL 211  (H) 03/24/2016 0627   TRIG 60 03/24/2016 0627   HDL 74 03/24/2016 0627   CHOLHDL 2.9 03/24/2016 0627   VLDL 12 03/24/2016 0627   LDLCALC 125 (H) 03/24/2016 4098     ASSESSMENT:    1. Longstanding persistent atrial fibrillation   2. Pacemaker   3. Coronary artery disease involving native coronary artery of native heart without angina pectoris   4. Essential hypertension      PLAN:  In order of problems listed above:  1. AFib: She now seems to be in permanent atrial fibrillation.  The pacemaker mode was switched to VVIR.  She is not receiving anticoagulation, I believe the decision made based on her high bleeding risk  (will defer this to her primary cardiologist). 2. PPM: Normal device function.  Recheck remote download in 3 months.  Mode changed to VVIR.  Cyber security upgrade performed today. 3. CAD: Her chest discomfort improves with Tylenol and has a somewhat positional pattern.  It sounds non-coronary. 4. HTN: Good blood pressure control.  If she continues to lose weight I am sure we will have to back off her antihypertensive medications.  The amlodipine and the carvedilol also started as antianginals.  Would stop the ramipril first if necessary.     Medication Adjustments/Labs and Tests Ordered: Current medicines are reviewed at length with the patient today.  Concerns regarding medicines are outlined above.  Medication changes, Labs and Tests ordered today are listed in the Patient Instructions below. Patient Instructions  Medication Instructions:  Dr Royann Shivers recommends that you continue on your current medications as directed. Please refer to the Current Medication list given to you today.  If you need a refill on your cardiac medications before your next appointment, please call your pharmacy.   Testing/Procedures: Remote monitoring is used to monitor your Pacemaker of ICD from home. This monitoring reduces the number of office visits required to check your device to  one time per year. It allows [place] to keep an eye on the functioning of your device to ensure it is working properly. You are scheduled for a device check from home on Thursday, February 20th, 2020. You may send your transmission at any time that day. If you have a wireless device, the transmission will be sent automatically. After your physician reviews your transmission, you will receive a postcard with your next transmission date.  Follow-Up: At [facility], you and your health needs are our priority.  As part of our continuing mission to provide you with exceptional heart care, we have created designated Provider Care Teams.  These Care Teams include your primary Cardiologist (physician) and Advanced Practice Providers (APPs -  Physician Assistants and Nurse Practitioners) who all work together to provide you with the care you need, when you need it. You will need a follow up appointment in 12 months.  Please call our office 2 months in advance to schedule this appointment.  You may see Thurmon Fair, MD or one of the following Advanced Practice Providers on your designated Care Team: [place], [place] . Micah Flesher, PA-C    Signed, Thurmon Fair, MD  09/13/2018 4:13 PM    [facility] [address] [place], [place], [place]  [address] Phone: [phone]; Fax: [phone]

## 2018-09-13 ENCOUNTER — Encounter: Payer: Self-pay | Admitting: Cardiovascular Disease

## 2018-09-13 LAB — CUP PACEART INCLINIC DEVICE CHECK
Date Time Interrogation Session: 20191115161218
Implantable Lead Implant Date: 20110923
Implantable Lead Implant Date: 20110923
Implantable Lead Location: 753859
Implantable Lead Location: 753860
Implantable Pulse Generator Implant Date: 20110923
Lead Channel Setting Pacing Amplitude: 0.875
Lead Channel Setting Pacing Amplitude: 2 V
Lead Channel Setting Pacing Pulse Width: 0.4 ms
Lead Channel Setting Sensing Sensitivity: 2 mV
Pulse Gen Model: 2210
Pulse Gen Serial Number: 2561895

## 2018-09-24 ENCOUNTER — Other Ambulatory Visit: Payer: Self-pay | Admitting: Cardiovascular Disease

## 2018-09-24 MED ORDER — CARVEDILOL 12.5 MG PO TABS: ORAL_TABLET | ORAL | 3 refills | Status: DC

## 2018-09-24 NOTE — Telephone Encounter (Signed)
Rx(s) sent to pharmacy electronically.  

## 2018-09-24 NOTE — Telephone Encounter (Signed)
°*  STAT* If patient is at the pharmacy, call can be transferred to refill team.   1. Which medications need to be refilled? (please list name of each medication and dose if known) Carvedilol  2. Which pharmacy/location (including street and city if local pharmacy) is medication to be sent to?[facility]  3. Do they need a 30 day or 90 day supply? 180 and refills

## 2018-12-13 ENCOUNTER — Telehealth: Payer: Self-pay

## 2018-12-13 NOTE — Telephone Encounter (Signed)
Left message for patient to remind of missed remote transmission.  

## 2018-12-27 ENCOUNTER — Encounter: Payer: Self-pay | Admitting: Cardiology

## 2019-01-06 ENCOUNTER — Other Ambulatory Visit: Payer: Self-pay | Admitting: Physician Assistant

## 2019-04-23 ENCOUNTER — Other Ambulatory Visit: Payer: Self-pay

## 2019-04-23 ENCOUNTER — Emergency Department (HOSPITAL_COMMUNITY)
Admission: EM | Admit: 2019-04-23 | Discharge: 2019-04-24 | Disposition: A | Discharge status: Home / Self Care | Payer: Medicare Other | Attending: Emergency Medicine | Admitting: Emergency Medicine

## 2019-04-23 DIAGNOSIS — Z87891 Personal history of nicotine dependence: Secondary | ICD-10-CM | POA: Diagnosis not present

## 2019-04-23 DIAGNOSIS — B370 Candidal stomatitis: Secondary | ICD-10-CM

## 2019-04-23 DIAGNOSIS — I10 Essential (primary) hypertension: Secondary | ICD-10-CM | POA: Diagnosis not present

## 2019-04-23 DIAGNOSIS — Z950 Presence of cardiac pacemaker: Secondary | ICD-10-CM | POA: Insufficient documentation

## 2019-04-23 DIAGNOSIS — I2510 Atherosclerotic heart disease of native coronary artery without angina pectoris: Secondary | ICD-10-CM | POA: Diagnosis not present

## 2019-04-23 DIAGNOSIS — I252 Old myocardial infarction: Secondary | ICD-10-CM | POA: Diagnosis not present

## 2019-04-23 DIAGNOSIS — Z79899 Other long term (current) drug therapy: Secondary | ICD-10-CM | POA: Diagnosis not present

## 2019-04-23 DIAGNOSIS — B379 Candidiasis, unspecified: Secondary | ICD-10-CM | POA: Diagnosis not present

## 2019-04-23 DIAGNOSIS — I480 Paroxysmal atrial fibrillation: Secondary | ICD-10-CM | POA: Diagnosis not present

## 2019-04-23 DIAGNOSIS — J029 Acute pharyngitis, unspecified: Secondary | ICD-10-CM | POA: Insufficient documentation

## 2019-04-23 LAB — GROUP A STREP BY PCR: Group A Strep by PCR: NOT DETECTED

## 2019-04-23 MED ORDER — ALUM & MAG HYDROXIDE-SIMETH 200-200-20 MG/5ML PO SUSP: 30.0000 mL | Freq: Once | ORAL | Status: DC

## 2019-04-23 MED ORDER — LIDOCAINE VISCOUS HCL 2 % MT SOLN: 15.0000 mL | Freq: Once | OROMUCOSAL | Status: DC

## 2019-04-23 MED ORDER — NYSTATIN 100000 UNIT/ML MT SUSP
5.0000 mL | Freq: Once | OROMUCOSAL | Status: AC
  Administered 2019-04-24: 500000 [IU] via ORAL
  Filled 2019-04-23: qty 5

## 2019-04-23 NOTE — ED Notes (Signed)
Patient states she feels like something is stuck in her throat; nothing noted on assessment. Respiratory assessment WDL. Will continue to monitor patient.

## 2019-04-23 NOTE — ED Notes (Signed)
Bed: ST41 Expected date:  Expected time:  Means of arrival:  Comments: 40F SOB

## 2019-04-23 NOTE — ED Provider Notes (Signed)
[facility] DEPT Provider Note   CSN: [id number] Arrival date & time: 04/23/19  2238     History   Chief Complaint Chief Complaint  Patient presents with  . Sore Throat    HPI [name] is a 85 y.o. female.     The history is provided by the patient and medical records.    85 y.o. F with hx of anemia, chronic angina, coronary artery disease, GERD, hyperlipidemia, hypertension, history of sick sinus syndrome status post pacemaker placement, presenting to the ED with sore throat.  Patient reports this is been going on for about 3 days now.  States she has not really wanted to eat due to pain.  She was able to eat some grits and half of an egg this morning.  States throat does feel better if she drinks cold water/drinks.  She saw her primary care doctor who prescribed her some throat lozenges but has not had any relief.  States she feels it all the way down her throat.  She denies any chest pain or shortness of breath.  She has not tried any medications aside from the throat lozenges.  Past Medical History:  Diagnosis Date  . Anemia   . Angina    on nitro-dur  . Coronary artery disease   . Elevated troponin   . GERD (gastroesophageal reflux disease)   . Glaucoma   . H/O myocardial perfusion scan 07/13/2011   The post stress left ventricle is normal in size. the rest left ventricle is normal in size. there is no scintigraphic evidence of inducible myocardial ishemia. The post-stress left ventricular function is normal. the post-stress ejection fraction is 66%.This is a low risk scan.  Marland Kitchen History of eye surgery    bilateral   . HOH (hard of hearing)   . Hyperlipidemia   . Hypertension   . Myocardial infarction ([place])    x 3  . Pacemaker   . Rotator cuff tear    chronic  . Shortness of breath    with exertion  . SSS (sick sinus syndrome) ([place]) 07/25/2016    Patient Active Problem List   Diagnosis Date Noted  . Impingement syndrome of left  shoulder 09/15/2016  . SSS (sick sinus syndrome) ([place]) 07/25/2016  . Paroxysmal atrial fibrillation ([place]) 07/25/2016  . Elevated troponin 03/24/2016  . Left shoulder pain 03/24/2016  . Rotator cuff tear   . HOH (hard of hearing)   . Left arm pain   . Unstable angina ([place]) 11/18/2015  . Chest pain 12/17/2014  . CAD (coronary artery disease) 04/11/2013  . HTN (hypertension) 04/11/2013  . Pacemaker   . Hyperlipidemia     Past Surgical History:  Procedure Laterality Date  . APPENDECTOMY    . cardiac catherization  06/2010  . CARDIAC CATHETERIZATION    . CARDIAC CATHETERIZATION Left 07/19/2010   LV showed good LV systolic function, LVH EF of 60 % to 65 %. Left main was patent. LAD has 20 % to 30 % proximal stenosis and 99 % mid stenosis with TIMI 2 distal flow. Diagonal-1 was very, very small. Diagonal-2 was very small, Diagonal-3 was small which has 30 % to 40 % ostial stenosis just before the bifurcation with LAD stenosis. Left circumflex has 40 % to 50 % mid stenosis and 70 % to 75 % .  Marland Kitchen CORONARY ANGIOPLASTY WITH STENT PLACEMENT  02/13/2012  . HYSTEROSCOPY W/D&C  09/07/2011   Procedure: DILATATION AND CURETTAGE (D&C) /HYSTEROSCOPY;  Surgeon:  Turner Danielsavid C Lowe, MD;  Location: [facility];  Service: Gynecology;  Laterality: N/A;  uterus  . INSERT / REPLACE / REMOVE PACEMAKER    . LEFT HEART CATHETERIZATION WITH CORONARY ANGIOGRAM N/A 01/25/2012   Procedure: LEFT HEART CATHETERIZATION WITH CORONARY ANGIOGRAM;  Surgeon: Robynn PaneMohan N Harwani, MD;  [facility];  Service: Cardiovascular;  Laterality: N/A;  . LEFT HEART CATHETERIZATION WITH CORONARY ANGIOGRAM N/A 12/18/2014   Procedure: LEFT HEART CATHETERIZATION WITH CORONARY ANGIOGRAM;  Surgeon: Robynn PaneMohan N Harwani, MD;  Location: [facility] CATH LAB;  Service: Cardiovascular;  Laterality: N/A;  . PACEMAKER INSERTION  06/2010  . PERCUTANEOUS CORONARY STENT INTERVENTION (PCI-S) N/A 02/13/2012   Procedure: PERCUTANEOUS CORONARY STENT INTERVENTION (PCI-S);  Surgeon:  Robynn PaneMohan N Harwani, MD;  [facility];  Service: Cardiovascular;  Laterality: N/A;  . svd     x 2  . THYROIDECTOMY       OB History   No obstetric history on file.      Home Medications    Prior to Admission medications   Medication Sig Start Date End Date Taking? Authorizing Provider  acetaminophen (TYLENOL) 500 MG tablet Take 500 mg by mouth every 4 (four) hours as needed for mild pain (pain).     [provider]  amLODipine (NORVASC) 5 MG tablet Take 5 mg by mouth daily.    [provider]  carvedilol (COREG) 12.5 MG tablet TAKE 1 TABLET BY MOUTH TWICE DAILY WITH A MEAL 09/24/18   Croitoru, Mihai, MD  conjugated estrogens (PREMARIN) vaginal cream Place 1 Applicatorful vaginally 3 (three) times a week. Use at bedtime on Monday, Wednesday, Friday    [provider]  Dorzolamide HCl-Timolol Mal PF (COSOPT PF) 22.3-6.8 MG/ML SOLN Place 1 drop into both eyes 2 (two) times daily.    [provider]  famotidine (PEPCID) 20 MG tablet Take 20 mg by mouth 2 (two) times daily as needed for heartburn.     [provider]  furosemide (LASIX) 20 MG tablet Take 20 mg by mouth daily. 05/09/14   [provider]  gabapentin (NEURONTIN) 100 MG capsule Take 1 capsule (100 mg total) by mouth 3 (three) times daily as needed (pain). 03/26/16   Kathlen ModyAkula, Vijaya, MD  HYDROcodone-acetaminophen (NORCO/VICODIN) 5-325 MG tablet Take 0.5 tablets by mouth every 6 (six) hours as needed for moderate pain.    [provider]  hydrocortisone (PROCTOZONE-HC) 2.5 % rectal cream Place 1 application rectally 2 (two) times daily as needed for hemorrhoids or itching.    [provider]  lidocaine (XYLOCAINE) 5 % ointment Apply 1 application topically as needed for mild pain (for rectal pain).    [provider]  nitroGLYCERIN (NITRODUR - DOSED IN MG/24 HR) 0.4 mg/hr patch Place 0.4 mg onto the skin every morning. REMOVE EVERY NIGHT    [provider]  ondansetron (ZOFRAN ODT) 4 MG disintegrating tablet Take 1 tablet (4 mg total) by mouth every 8 (eight) hours as needed for nausea or vomiting. 03/27/16   Vanetta MuldersZackowski, Scott, MD  prednisoLONE acetate (PRED FORTE) 1 % ophthalmic suspension Place 2 drops into the right eye 4 (four) times daily.     [provider]  ramipril (ALTACE) 10 MG capsule Take 10 mg by mouth daily.     [provider]  Tafluprost (ZIOPTAN) 0.0015 % SOLN Place 1 drop into the right eye at bedtime.    [provider]    Family History Family History  Problem Relation Age  of Onset  . Diabetes Mother   . Cancer Sister     Social History Social History   Tobacco Use  . Smoking status: Former Smoker    Packs/day: 0.50    Types: Cigarettes    Quit date: 09/05/1991    Years since quitting: 27.6  . Smokeless tobacco: Never Used  Substance Use Topics  . Alcohol use: No    Comment: none since 1992  . Drug use: No     Allergies   Influenza vac split [flu virus vaccine], Tramadol, and Shellfish allergy   Review of Systems Review of Systems  HENT: Positive for sore throat.   All other systems reviewed and are negative.    Physical Exam Updated Vital Signs BP (!) 157/87 (BP Location: Left Arm)   Pulse (!) 59   Temp 97.8 F (36.6 C) (Oral)   Resp 15   SpO2 100%   Physical Exam Vitals signs and nursing note reviewed.  Constitutional:      Appearance: She is well-developed.     Comments: Extremely hard of hearing  HENT:     Head: Normocephalic and atraumatic.     Mouth/Throat:     Comments: Thick white film present on tongue, some is able to be scraped off with tongue depressor, small area of white patch noted in posterior oropharynx but no appreciable tonsillar edema or exudates, handling secretions well, no difficulty swallowing or speaking, phonation is normal Eyes:     Conjunctiva/sclera: Conjunctivae normal.     Pupils: Pupils are equal, round, and reactive  to light.  Neck:     Musculoskeletal: Normal range of motion.  Cardiovascular:     Rate and Rhythm: Normal rate and regular rhythm.     Heart sounds: Normal heart sounds.  Pulmonary:     Effort: Pulmonary effort is normal.     Breath sounds: Normal breath sounds.  Chest:     Comments: Pacemaker left chest wall along with nitro patch beneath it Abdominal:     General: Bowel sounds are normal.     Palpations: Abdomen is soft.  Musculoskeletal: Normal range of motion.  Skin:    General: Skin is warm and dry.  Neurological:     Mental Status: She is alert and oriented to person, place, and time.      ED Treatments / Results  Labs (all labs ordered are listed, but only abnormal results are displayed) Labs Reviewed  GROUP A STREP BY PCR    EKG    Radiology No results found.  Procedures Procedures (including critical care time)  Medications Ordered in ED Medications  nystatin (MYCOSTATIN) 100000 UNIT/ML suspension 500,000 Units (has no administration in time range)     Initial Impression / Assessment and Plan / ED Course  I have reviewed the triage vital signs and the nursing notes.  Pertinent labs & imaging results that were available during my care of the patient were reviewed by me and considered in my medical decision making (see chart for details).  85 year old female here with sore throat.  This is been ongoing for 3 days.  States she is been eating less because it is painful to swallow, however was able to eat grits and half of an egg this morning.  Pain feels better when drinking cold fluids.  She is afebrile and nontoxic.  She does have thick, white film present on her tongue and some white patches in the posterior oropharynx.  Some of this is able to be scraped  off with a tongue depressor.  She does not have any visible tonsillar edema or exudates.  She is handling her secretions well, phonation is normal without stridor.  Suspect this is thrush.  She did have a  rapid strep screen that was performed and is negative.  She will be started on a nystatin rinse.  She can follow-up with her primary care doctor.  She can return here for any new or acute changes.  Final Clinical Impressions(s) / ED Diagnoses   Final diagnoses:  Sore throat  Thrush    ED Discharge Orders         Ordered    nystatin (MYCOSTATIN) 100000 UNIT/ML suspension  4 times daily     04/24/19 0031           Garlon HatchetSanders, Triston Skare M, PA-C 04/24/19 0040    Glynn Octaveancour, Stephen, MD 04/24/19 [phone]

## 2019-04-23 NOTE — ED Triage Notes (Signed)
Patient coming from home with complaints of a sore throat. Patient was seen by PCP today with no findings, but was given throat lozenges that patient states are not working. Patient endorses difficulty swallowing, but has been able to tolerate fluids and food. Denies any other symptoms at this time.

## 2019-04-24 MED ORDER — NYSTATIN 100000 UNIT/ML MT SUSP: 500000.0000 [IU] | Freq: Four times a day (QID) | OROMUCOSAL | 0 refills | Status: DC

## 2019-04-24 NOTE — Discharge Instructions (Signed)
Take the prescribed medication as directed. °Follow-up with your primary care doctor. °Return to the ED for new or worsening symptoms. °

## 2019-04-24 NOTE — ED Notes (Signed)
Patient discharge instructions reviewed with patient and family member. Patient stable and ambulatory at discharge.

## 2019-04-26 ENCOUNTER — Other Ambulatory Visit: Payer: Self-pay

## 2019-04-26 ENCOUNTER — Emergency Department (HOSPITAL_COMMUNITY): Payer: Medicare Other

## 2019-04-26 ENCOUNTER — Encounter (HOSPITAL_COMMUNITY): Payer: Self-pay | Admitting: *Deleted

## 2019-04-26 ENCOUNTER — Emergency Department (HOSPITAL_COMMUNITY)
Admission: EM | Admit: 2019-04-26 | Discharge: 2019-04-26 | Disposition: A | Discharge status: Home / Self Care | Payer: Medicare Other | Attending: Emergency Medicine | Admitting: Emergency Medicine

## 2019-04-26 DIAGNOSIS — B370 Candidal stomatitis: Secondary | ICD-10-CM

## 2019-04-26 DIAGNOSIS — Z950 Presence of cardiac pacemaker: Secondary | ICD-10-CM | POA: Diagnosis not present

## 2019-04-26 DIAGNOSIS — R1084 Generalized abdominal pain: Secondary | ICD-10-CM | POA: Insufficient documentation

## 2019-04-26 DIAGNOSIS — B379 Candidiasis, unspecified: Secondary | ICD-10-CM | POA: Insufficient documentation

## 2019-04-26 DIAGNOSIS — Z79899 Other long term (current) drug therapy: Secondary | ICD-10-CM | POA: Diagnosis not present

## 2019-04-26 DIAGNOSIS — Z955 Presence of coronary angioplasty implant and graft: Secondary | ICD-10-CM | POA: Diagnosis not present

## 2019-04-26 DIAGNOSIS — I259 Chronic ischemic heart disease, unspecified: Secondary | ICD-10-CM | POA: Insufficient documentation

## 2019-04-26 DIAGNOSIS — Z87891 Personal history of nicotine dependence: Secondary | ICD-10-CM | POA: Insufficient documentation

## 2019-04-26 DIAGNOSIS — R079 Chest pain, unspecified: Secondary | ICD-10-CM | POA: Insufficient documentation

## 2019-04-26 DIAGNOSIS — R1310 Dysphagia, unspecified: Secondary | ICD-10-CM

## 2019-04-26 DIAGNOSIS — J029 Acute pharyngitis, unspecified: Secondary | ICD-10-CM | POA: Diagnosis present

## 2019-04-26 DIAGNOSIS — I10 Essential (primary) hypertension: Secondary | ICD-10-CM | POA: Diagnosis not present

## 2019-04-26 DIAGNOSIS — I252 Old myocardial infarction: Secondary | ICD-10-CM | POA: Insufficient documentation

## 2019-04-26 LAB — COMPREHENSIVE METABOLIC PANEL
ALT: 10 U/L (ref 0–44)
AST: 14 U/L — ABNORMAL LOW (ref 15–41)
Albumin: 3.8 g/dL (ref 3.5–5.0)
Alkaline Phosphatase: 56 U/L (ref 38–126)
Anion gap: 9 (ref 5–15)
BUN: 11 mg/dL (ref 8–23)
CO2: 25 mmol/L (ref 22–32)
Calcium: 9.2 mg/dL (ref 8.9–10.3)
Chloride: 105 mmol/L (ref 98–111)
Creatinine, Ser: 1.26 mg/dL — ABNORMAL HIGH (ref 0.44–1.00)
GFR calc Af Amer: 41 mL/min — ABNORMAL LOW (ref >60)
GFR calc non Af Amer: 35 mL/min — ABNORMAL LOW (ref >60)
Glucose, Bld: 93 mg/dL (ref 70–99)
Potassium: 3.9 mmol/L (ref 3.5–5.1)
Sodium: 139 mmol/L (ref 135–145)
Total Bilirubin: 0.4 mg/dL (ref 0.3–1.2)
Total Protein: 6.2 g/dL — ABNORMAL LOW (ref 6.5–8.1)

## 2019-04-26 LAB — CBC
HCT: 39 % (ref 36.0–46.0)
Hemoglobin: 12.5 g/dL (ref 12.0–15.0)
MCH: 30.3 pg (ref 26.0–34.0)
MCHC: 32.1 g/dL (ref 30.0–36.0)
MCV: 94.7 fL (ref 80.0–100.0)
Platelets: 191 10*3/uL (ref 150–400)
RBC: 4.12 MIL/uL (ref 3.87–5.11)
RDW: 13.7 % (ref 11.5–15.5)
WBC: 5 10*3/uL (ref 4.0–10.5)
nRBC: 0 % (ref 0.0–0.2)

## 2019-04-26 LAB — LIPASE, BLOOD: Lipase: 24 U/L (ref 11–51)

## 2019-04-26 MED ORDER — SUCRALFATE 1 G PO TABS: 1.0000 g | ORAL_TABLET | Freq: Four times a day (QID) | ORAL | 0 refills | Status: DC | PRN

## 2019-04-26 MED ORDER — SODIUM CHLORIDE 0.9% FLUSH: 3.0000 mL | Freq: Once | INTRAVENOUS | Status: DC

## 2019-04-26 MED ORDER — IOHEXOL 350 MG/ML SOLN
75.0000 mL | Freq: Once | INTRAVENOUS | Status: AC | PRN
  Administered 2019-04-26: 75 mL via INTRAVENOUS

## 2019-04-26 MED ORDER — SODIUM CHLORIDE 0.9 % IV BOLUS
1000.0000 mL | Freq: Once | INTRAVENOUS | Status: AC
  Administered 2019-04-26: 1000 mL via INTRAVENOUS

## 2019-04-26 NOTE — ED Notes (Signed)
Pt not in room.

## 2019-04-26 NOTE — ED Notes (Signed)
Vomited all the medication back.

## 2019-04-26 NOTE — Discharge Instructions (Addendum)
You were evaluated in the Emergency Department and after careful evaluation, we did not find any emergent condition requiring admission or further testing in the hospital.  Your symptoms today seem to be due to inflammation of the stomach causing acid reflux.  Your testing today was otherwise reassuring.  Please drink more fluids at home and follow-up with your primary care doctor to further discuss the gradual increased confusion over the past few months and any medication that can be used to help with sleep.  Please return to the Emergency Department if you experience any worsening of your condition.  We encourage you to follow up with a primary care provider.  Thank you for allowing [place] to be a part of your care.

## 2019-04-26 NOTE — ED Notes (Signed)
Granddaughter is on her way to pick patient up.

## 2019-04-26 NOTE — ED Provider Notes (Signed)
MSE was initiated and I personally evaluated the patient and placed orders (if any) at  12:36 AM on April 26, 2019.  The patient appears stable so that the remainder of the MSE may be completed by another provider.   Patient presents with complaints of sore throat.  She reports that she has been unable to eat for the last few days due to her sore throat.  She reports that she has also had a productive cough and body aches for the last week and lower abdominal pain for several days.  She also endorses vomiting, but states " if a lot of phlegm".  Reports that she has been having regular bowel movements almost every other day.  She also reports that she has been feeling somewhat short of breath.  When asked about chest pain, she reports difficulty swallowing.  She was seen for the same 3 days ago and had a negative strep PCR.  She was discharged with nystatin, but reports that her symptoms worsen despite using the medication.  On exam, she has no increased work of breathing.  She has a dry hacking cough.  She is elderly and cachectic.  She is tolerating secretions.  Will order chest x-ray, EKG, and basic labs.   Joline Maxcy A, PA-C 04/26/19 0036    Ezequiel Essex, MD 04/26/19 [phone]

## 2019-04-26 NOTE — ED Provider Notes (Signed)
  Provider Note MRN:  [id number]  Arrival date & time: 04/26/19    ED Course and Medical Decision Making  Assumed care from Dr. Wyvonnia Dusky at shift change.  Issues with swallowing, CTs pending, anticipating discharge with GI follow-up.  9:38 AM update: CTs are largely unremarkable, nothing acute, evidence of gastritis, with acid reflux would somewhat explain patient's pain with swallowing.  Patient is exhibiting some evidence of dementia, spoke with patient's granddaughter who confirms that for the past several months she has had some increased confusion and this is her baseline.  Patient is without fever, no suprapubic tenderness, no dysuria, urinalysis testing currently not indicated.  Patient is appropriate for discharge with PCP and GI follow-up.  Final Clinical Impressions(s) / ED Diagnoses     ICD-10-CM   1. Thrush  B37.0   2. Odynophagia  R13.10     ED Discharge Orders         Ordered    sucralfate (CARAFATE) 1 g tablet  4 times daily PRN     04/26/19 0935           [name], [place] [email or web address]    Maudie Flakes, MD 04/26/19 [phone]

## 2019-04-26 NOTE — ED Provider Notes (Signed)
[facility] Provider Note   CSN: [id number] Arrival date & time: 04/26/19  0020     History   Chief Complaint Chief Complaint  Patient presents with  . Cough    HPI [name] is a 85 y.o. female.     Level 5 caveat for hearing difficulty.  85 year old female with sore throat for the past 4 to 5 days.  Patient states she has difficulty swallowing and some episodes of spitting up.  Discussed with patient's granddaughter [name] by phone.  She states patient was [facility] for this 2 days ago and treated for thrush.  She does not think the patient's had any episodes of emesis but has had some spitting up but does not seem to have any swallowing issues.  There is been a mild cough and some lower abdominal pain.  No fevers.  No chest pain or shortness of breath.  She was seen in the ED 3 nights ago and treated with nystatin for possible thrush. Patient is a poor historian.  Her granddaughter thinks that the patient does not want to swallow this because her throat is hurting her but she has not noticed any difficulty with food or liquids going down.  The history is provided by the patient.  Cough   Past Medical History:  Diagnosis Date  . Anemia   . Angina    on nitro-dur  . Coronary artery disease   . Elevated troponin   . GERD (gastroesophageal reflux disease)   . Glaucoma   . H/O myocardial perfusion scan 07/13/2011   The post stress left ventricle is normal in size. the rest left ventricle is normal in size. there is no scintigraphic evidence of inducible myocardial ishemia. The post-stress left ventricular function is normal. the post-stress ejection fraction is 66%.This is a low risk scan.  Marland Kitchen. History of eye surgery    bilateral   . HOH (hard of hearing)   . Hyperlipidemia   . Hypertension   . Myocardial infarction (HCC)    x 3  . Pacemaker   . Rotator cuff tear    chronic  . Shortness of breath    with exertion  .  SSS (sick sinus syndrome) (HCC) 07/25/2016    Patient Active Problem List   Diagnosis Date Noted  . Impingement syndrome of left shoulder 09/15/2016  . SSS (sick sinus syndrome) (HCC) 07/25/2016  . Paroxysmal atrial fibrillation (HCC) 07/25/2016  . Elevated troponin 03/24/2016  . Left shoulder pain 03/24/2016  . Rotator cuff tear   . HOH (hard of hearing)   . Left arm pain   . Unstable angina (HCC) 11/18/2015  . Chest pain 12/17/2014  . CAD (coronary artery disease) 04/11/2013  . HTN (hypertension) 04/11/2013  . Pacemaker   . Hyperlipidemia     Past Surgical History:  Procedure Laterality Date  . APPENDECTOMY    . cardiac catherization  06/2010  . CARDIAC CATHETERIZATION    . CARDIAC CATHETERIZATION Left 07/19/2010   LV showed good LV systolic function, LVH EF of 60 % to 65 %. Left main was patent. LAD has 20 % to 30 % proximal stenosis and 99 % mid stenosis with TIMI 2 distal flow. Diagonal-1 was very, very small. Diagonal-2 was very small, Diagonal-3 was small which has 30 % to 40 % ostial stenosis just before the bifurcation with LAD stenosis. Left circumflex has 40 % to 50 % mid stenosis and 70 % to 75 % .  .Marland Kitchen  CORONARY ANGIOPLASTY WITH STENT PLACEMENT  02/13/2012  . HYSTEROSCOPY W/D&C  09/07/2011   Procedure: DILATATION AND CURETTAGE (D&C) /HYSTEROSCOPY;  Surgeon: Turner Danielsavid C Lowe, MD;  Location: [facility] ORS;  Service: Gynecology;  Laterality: N/A;  uterus  . INSERT / REPLACE / REMOVE PACEMAKER    . LEFT HEART CATHETERIZATION WITH CORONARY ANGIOGRAM N/A 01/25/2012   Procedure: LEFT HEART CATHETERIZATION WITH CORONARY ANGIOGRAM;  Surgeon: Robynn PaneMohan N Harwani, MD;  [facility];  Service: Cardiovascular;  Laterality: N/A;  . LEFT HEART CATHETERIZATION WITH CORONARY ANGIOGRAM N/A 12/18/2014   Procedure: LEFT HEART CATHETERIZATION WITH CORONARY ANGIOGRAM;  Surgeon: Robynn PaneMohan N Harwani, MD;  Location: [facility] CATH LAB;  Service: Cardiovascular;  Laterality: N/A;  . PACEMAKER INSERTION  06/2010  .  PERCUTANEOUS CORONARY STENT INTERVENTION (PCI-S) N/A 02/13/2012   Procedure: PERCUTANEOUS CORONARY STENT INTERVENTION (PCI-S);  Surgeon: Robynn PaneMohan N Harwani, MD;  [facility];  Service: Cardiovascular;  Laterality: N/A;  . svd     x 2  . THYROIDECTOMY       OB History   No obstetric history on file.      Home Medications    Prior to Admission medications   Medication Sig Start Date End Date Taking? Authorizing Provider  acetaminophen (TYLENOL) 500 MG tablet Take 500 mg by mouth every 4 (four) hours as needed for mild pain (pain).     [provider]  amLODipine (NORVASC) 5 MG tablet Take 5 mg by mouth daily.    [provider]  carvedilol (COREG) 12.5 MG tablet TAKE 1 TABLET BY MOUTH TWICE DAILY WITH A MEAL 09/24/18   Croitoru, Mihai, MD  conjugated estrogens (PREMARIN) vaginal cream Place 1 Applicatorful vaginally 3 (three) times a week. Use at bedtime on Monday, Wednesday, Friday    [provider]  Dorzolamide HCl-Timolol Mal PF (COSOPT PF) 22.3-6.8 MG/ML SOLN Place 1 drop into both eyes 2 (two) times daily.    [provider]  famotidine (PEPCID) 20 MG tablet Take 20 mg by mouth 2 (two) times daily as needed for heartburn.     [provider]  furosemide (LASIX) 20 MG tablet Take 20 mg by mouth daily. 05/09/14   [provider]  gabapentin (NEURONTIN) 100 MG capsule Take 1 capsule (100 mg total) by mouth 3 (three) times daily as needed (pain). 03/26/16   Kathlen ModyAkula, Vijaya, MD  HYDROcodone-acetaminophen (NORCO/VICODIN) 5-325 MG tablet Take 0.5 tablets by mouth every 6 (six) hours as needed for moderate pain.    [provider]  hydrocortisone (PROCTOZONE-HC) 2.5 % rectal cream Place 1 application rectally 2 (two) times daily as needed for hemorrhoids or itching.    [provider]  lidocaine (XYLOCAINE) 5 % ointment Apply 1 application topically as needed for mild pain (for rectal pain).    [provider]   nitroGLYCERIN (NITRODUR - DOSED IN MG/24 HR) 0.4 mg/hr patch Place 0.4 mg onto the skin every morning. REMOVE EVERY NIGHT    [provider]  nystatin (MYCOSTATIN) 100000 UNIT/ML suspension Take 5 mLs (500,000 Units total) by mouth 4 (four) times daily. 04/24/19   Garlon HatchetSanders, Lisa M, PA-C  ondansetron (ZOFRAN ODT) 4 MG disintegrating tablet Take 1 tablet (4 mg total) by mouth every 8 (eight) hours as needed for nausea or vomiting. 03/27/16   Vanetta MuldersZackowski, Scott, MD  prednisoLONE acetate (PRED FORTE) 1 % ophthalmic suspension Place 2 drops into the right eye 4 (four) times daily.     [provider]  ramipril (ALTACE)  10 MG capsule Take 10 mg by mouth daily.     [provider]  Tafluprost (ZIOPTAN) 0.0015 % SOLN Place 1 drop into the right eye at bedtime.    [provider]    Family History Family History  Problem Relation Age of Onset  . Diabetes Mother   . Cancer Sister     Social History Social History   Tobacco Use  . Smoking status: Former Smoker    Packs/day: 0.50    Types: Cigarettes    Quit date: 09/05/1991    Years since quitting: 27.6  . Smokeless tobacco: Never Used  Substance Use Topics  . Alcohol use: No    Comment: none since 1992  . Drug use: No     Allergies   Influenza vac split [flu virus vaccine], Tramadol, and Shellfish allergy   Review of Systems Review of Systems  Unable to perform ROS: Acuity of condition  Respiratory: Positive for cough.      Physical Exam Updated Vital Signs BP 117/60   Pulse (!) 59   Temp 97.6 F (36.4 C) (Oral)   Resp 13   SpO2 100%   Physical Exam Vitals signs and nursing note reviewed.  Constitutional:      General: She is not in acute distress.    Appearance: She is well-developed.     Comments: elderly  HENT:     Head: Normocephalic and atraumatic.     Mouth/Throat:     Pharynx: No oropharyngeal exudate.     Comments: Mild erythema of oropharynx.  Controlling secretions.  no  tongue or lip swelling Eyes:     Conjunctiva/sclera: Conjunctivae normal.     Pupils: Pupils are equal, round, and reactive to light.  Neck:     Musculoskeletal: Normal range of motion and neck supple.     Comments: No meningismus. Cardiovascular:     Rate and Rhythm: Normal rate and regular rhythm.     Heart sounds: Normal heart sounds. No murmur.  Pulmonary:     Effort: Pulmonary effort is normal. No respiratory distress.     Breath sounds: Normal breath sounds.     Comments: Pacemaker in place Abdominal:     Palpations: Abdomen is soft.     Tenderness: There is abdominal tenderness. There is no guarding or rebound.     Comments: Mild diffuse tenderness without peritoneal sign  Musculoskeletal: Normal range of motion.        General: No tenderness.  Skin:    General: Skin is warm.     Capillary Refill: Capillary refill takes less than 2 seconds.  Neurological:     General: No focal deficit present.     Mental Status: She is alert. Mental status is at baseline.     Cranial Nerves: No cranial nerve deficit.     Motor: No abnormal muscle tone.     Coordination: Coordination normal.     Comments: No ataxia on finger to nose bilaterally. No pronator drift. 5/5 strength throughout. CN 2-12 intact.Equal grip strength. Sensation intact.   Psychiatric:        Behavior: Behavior normal.      ED Treatments / Results  Labs (all labs ordered are listed, but only abnormal results are displayed) Labs Reviewed  COMPREHENSIVE METABOLIC PANEL - Abnormal; Notable for the following components:      Result Value   Creatinine, Ser 1.26 (*)    Total Protein 6.2 (*)    AST 14 (*)  GFR calc non Af Amer 35 (*)    GFR calc Af Amer 41 (*)    All other components within normal limits  CBC  URINALYSIS, ROUTINE W REFLEX MICROSCOPIC  LIPASE, BLOOD    EKG EKG Interpretation  Date/Time:  Saturday April 26 2019 04:50:36 EDT Ventricular Rate:  62 PR Interval:    QRS Duration: 128 QT  Interval:  508 QTC Calculation: 516 R Axis:   -75 Text Interpretation:  atrial-sensed ventricular-paced complexes Nonspecific IVCD with LAD Left ventricular hypertrophy Confirmed by Glynn Octaveancour, Glorya Bartley [phone]([id number]) on 04/26/2019 5:12:25 AM   Radiology Dg Chest 2 View  Result Date: 04/26/2019 CLINICAL DATA:  Cough EXAM: CHEST - 2 VIEW COMPARISON:  03/27/2016 FINDINGS: Unchanged position of left chest wall pacemaker leads. Moderate cardiomegaly. No pleural effusion or pneumothorax. Lungs are clear. IMPRESSION: Cardiomegaly without acute airspace disease or pulmonary edema. Electronically Signed   By: Deatra RobinsonKevin  Herman M.D.   On: 04/26/2019 01:32    Procedures Procedures (including critical care time)  Medications Ordered in ED Medications  sodium chloride flush (NS) 0.9 % injection 3 mL (3 mLs Intravenous Not Given 04/26/19 0501)  sodium chloride 0.9 % bolus 1,000 mL (has no administration in time range)     Initial Impression / Assessment and Plan / ED Course  I have reviewed the triage vital signs and the nursing notes.  Pertinent labs & imaging results that were available during my care of the patient were reviewed by me and considered in my medical decision making (see chart for details).       Sore throat and not wanting to swallow x 4-5 days. No true vomiting but some spitting up.  No problem handling secretions.  EKG paced. CXR negative.  Labs reassuring. Gentle hydration given.   After discussion with granddaughter, it seems patient has pain with swallowing but no issues getting liquids to go down.  OP mildly erythematous  Will obtain additional imaging to rule out foreign body or other obstruction.  CTs pending at shift change. Anticipate starting PPI and GI followup if imaging reassuring and able to tolerate PO.  Dr .Pilar PlateBero to assume care at shift change. Final Clinical Impressions(s) / ED Diagnoses   Final diagnoses:  None    ED Discharge Orders    None       Pierrette Scheu,  Jeannett SeniorStephen, MD 04/26/19 [phone]

## 2019-04-26 NOTE — ED Triage Notes (Addendum)
Pt has been seen earlier in the week and diagnosed with thrush. Has been taking nystatin as prescribed. Pt reports ongoing sore throat, abd pain, chest pain with coughing, and cough for the past several days

## 2019-04-26 NOTE — ED Notes (Signed)
Called granddaughter back-states she will be here in 15 minutes

## 2019-09-15 ENCOUNTER — Encounter: Payer: Self-pay | Admitting: Cardiovascular Disease

## 2019-09-15 ENCOUNTER — Ambulatory Visit (INDEPENDENT_AMBULATORY_CARE_PROVIDER_SITE_OTHER): Payer: Medicare Other | Admitting: Cardiovascular Disease

## 2019-09-15 ENCOUNTER — Other Ambulatory Visit: Payer: Self-pay

## 2019-09-15 VITALS — BP 141/81 | HR 60 | Temp 97.1°F | Ht 62.0 in | Wt 96.8 lb

## 2019-09-15 DIAGNOSIS — Z950 Presence of cardiac pacemaker: Secondary | ICD-10-CM

## 2019-09-15 DIAGNOSIS — I25118 Atherosclerotic heart disease of native coronary artery with other forms of angina pectoris: Secondary | ICD-10-CM | POA: Diagnosis not present

## 2019-09-15 DIAGNOSIS — I10 Essential (primary) hypertension: Secondary | ICD-10-CM | POA: Diagnosis not present

## 2019-09-15 DIAGNOSIS — I4821 Permanent atrial fibrillation: Secondary | ICD-10-CM | POA: Diagnosis not present

## 2019-09-15 NOTE — Progress Notes (Signed)
Cardiology Office Note    Date:  09/17/2019   ID:  [name], DOB [birth year], MRN [id number]  PCP:  Asencion Gowda.August Saucer, MD  Cardiologist:  Rinaldo Cloud, MD; Thurmon Fair, MD   No chief complaint on file.   History of Present Illness:  [name] is a 85 y.o. female with sinus node dysfunction and AV conduction disease presenting for pacemaker follow-up. She also has paroxysmal atrial fibrillation, coronary artery disease s/p her septal infarction and LAD stent in 2011, stent to left circumflex in 2013, hyperlipidemia and hypertension for which she is followed by Dr. [name].  Her sister recently died.  Since then Mrs. [name] has been even less interactive.  She is completely blind and has very poor hearing.  She is now living with her granddaughter.  She is losing weight.  She appears even more depressed.  She is very sedentary. She has no cardiovascular complaints. The patient specifically denies any chest pain at rest exertion, dyspnea at rest or with exertion, orthopnea, paroxysmal nocturnal dyspnea, syncope, palpitations, focal neurological deficits, intermittent claudication, lower extremity edema, unexplained weight gain, cough, hemoptysis or wheezing.  She is in longstanding persistent atrial fibrillation.  She has 96 % ventricular paced rhythm, very rarely atrial fibrillation conducted via the AV node, occasional PVCs.  Underlying rhythm is atrial fibrillation at 40-45 bpm.  Lead parameters are good and estimated generator longevity is 2.3 years. Her pacemaker is a [employer] accent DR RF device was implanted in 2011.   Past Medical History:  Diagnosis Date  . Anemia   . Angina    on nitro-dur  . Coronary artery disease   . Elevated troponin   . GERD (gastroesophageal reflux disease)   . Glaucoma   . H/O myocardial perfusion scan 07/13/2011   The post stress left ventricle is normal in size. the rest left ventricle is normal in size. there is no scintigraphic evidence  of inducible myocardial ishemia. The post-stress left ventricular function is normal. the post-stress ejection fraction is 66%.This is a low risk scan.  Marland Kitchen History of eye surgery    bilateral   . HOH (hard of hearing)   . Hyperlipidemia   . Hypertension   . Myocardial infarction (HCC)    x 3  . Pacemaker   . Rotator cuff tear    chronic  . Shortness of breath    with exertion  . SSS (sick sinus syndrome) (HCC) 07/25/2016    Past Surgical History:  Procedure Laterality Date  . APPENDECTOMY    . cardiac catherization  06/2010  . CARDIAC CATHETERIZATION    . CARDIAC CATHETERIZATION Left 07/19/2010   LV showed good LV systolic function, LVH EF of 60 % to 65 %. Left main was patent. LAD has 20 % to 30 % proximal stenosis and 99 % mid stenosis with TIMI 2 distal flow. Diagonal-1 was very, very small. Diagonal-2 was very small, Diagonal-3 was small which has 30 % to 40 % ostial stenosis just before the bifurcation with LAD stenosis. Left circumflex has 40 % to 50 % mid stenosis and 70 % to 75 % .  Marland Kitchen CORONARY ANGIOPLASTY WITH STENT PLACEMENT  02/13/2012  . HYSTEROSCOPY W/D&C  09/07/2011   Procedure: DILATATION AND CURETTAGE (D&C) /HYSTEROSCOPY;  Surgeon: Turner Daniels, MD;  Location: [facility] ORS;  Service: Gynecology;  Laterality: N/A;  uterus  . INSERT / REPLACE / REMOVE PACEMAKER    . LEFT HEART CATHETERIZATION WITH CORONARY ANGIOGRAM N/A 01/25/2012  Procedure: LEFT HEART CATHETERIZATION WITH CORONARY ANGIOGRAM;  Surgeon: Robynn Pane, MD;  Location: [facility] CATH LAB;  Service: Cardiovascular;  Laterality: N/A;  . LEFT HEART CATHETERIZATION WITH CORONARY ANGIOGRAM N/A 12/18/2014   Procedure: LEFT HEART CATHETERIZATION WITH CORONARY ANGIOGRAM;  Surgeon: Robynn Pane, MD;  Location: [facility] CATH LAB;  Service: Cardiovascular;  Laterality: N/A;  . PACEMAKER INSERTION  06/2010  . PERCUTANEOUS CORONARY STENT INTERVENTION (PCI-S) N/A 02/13/2012   Procedure: PERCUTANEOUS CORONARY STENT INTERVENTION (PCI-S);   Surgeon: Robynn Pane, MD;  Location: [facility] CATH LAB;  Service: Cardiovascular;  Laterality: N/A;  . svd     x 2  . THYROIDECTOMY      Current Medications: Outpatient Medications Prior to Visit  Medication Sig Dispense Refill  . acetaminophen (TYLENOL) 500 MG tablet Take 500 mg by mouth every 4 (four) hours as needed for mild pain (pain).     Marland Kitchen amLODipine (NORVASC) 5 MG tablet Take 5 mg by mouth daily.    . carvedilol (COREG) 12.5 MG tablet TAKE 1 TABLET BY MOUTH TWICE DAILY WITH A MEAL 180 tablet 3  . conjugated estrogens (PREMARIN) vaginal cream Place 1 Applicatorful vaginally 3 (three) times a week. Use at bedtime on Monday, Wednesday, Friday    . Dorzolamide HCl-Timolol Mal PF (COSOPT PF) 22.3-6.8 MG/ML SOLN Place 1 drop into both eyes 2 (two) times daily.    . famotidine (PEPCID) 20 MG tablet Take 20 mg by mouth 2 (two) times daily as needed for heartburn.     . furosemide (LASIX) 20 MG tablet Take 40 mg by mouth daily.     Marland Kitchen gabapentin (NEURONTIN) 100 MG capsule Take 1 capsule (100 mg total) by mouth 3 (three) times daily as needed (pain). 90 capsule 0  . HYDROcodone-acetaminophen (NORCO/VICODIN) 5-325 MG tablet Take 0.5 tablets by mouth every 6 (six) hours as needed for moderate pain.    . hydrocortisone (PROCTOZONE-HC) 2.5 % rectal cream Place 1 application rectally 2 (two) times daily as needed for hemorrhoids or itching.    . lidocaine (XYLOCAINE) 5 % ointment Apply 1 application topically as needed for mild pain (for rectal pain).    . nitroGLYCERIN (NITRODUR - DOSED IN MG/24 HR) 0.4 mg/hr patch Place 0.4 mg onto the skin every morning. REMOVE EVERY NIGHT    . ramipril (ALTACE) 10 MG capsule Take 10 mg by mouth daily.     . sucralfate (CARAFATE) 1 g tablet Take 1 tablet (1 g total) by mouth 4 (four) times daily as needed. 30 tablet 0  . Tafluprost (ZIOPTAN) 0.0015 % SOLN Place 1 drop into both eyes at bedtime.     Marland Kitchen nystatin (MYCOSTATIN) 100000 UNIT/ML suspension Take 5 mLs  (500,000 Units total) by mouth 4 (four) times daily. (Patient not taking: Reported on 09/15/2019) 100 mL 0   No facility-administered medications prior to visit.      Allergies:   Influenza vac split [flu virus vaccine], Tramadol, and Shellfish allergy   Social History   Socioeconomic History  . Marital status: Widowed    Spouse name: Not on file  . Number of children: 2  . Years of education: Not on file  . Highest education level: Not on file  Occupational History  . Occupation: retired  Engineer, production  . Financial resource strain: Not on file  . Food insecurity    Worry: Not on file    Inability: Not on file  . Transportation needs    Medical: Not on file  Non-medical: Not on file  Tobacco Use  . Smoking status: Former Smoker    Packs/day: 0.50    Types: Cigarettes    Quit date: 09/05/1991    Years since quitting: 28.0  . Smokeless tobacco: Never Used  Substance and Sexual Activity  . Alcohol use: No    Comment: none since 1992  . Drug use: No  . Sexual activity: Not Currently  Lifestyle  . Physical activity    Days per week: Not on file    Minutes per session: Not on file  . Stress: Not on file  Relationships  . Social Musicianconnections    Talks on phone: Not on file    Gets together: Not on file    Attends religious service: Not on file    Active member of club or organization: Not on file    Attends meetings of clubs or organizations: Not on file    Relationship status: Not on file  Other Topics Concern  . Not on file  Social History Narrative  . Not on file     Family History:  The patient's family history includes Cancer in her sister; Diabetes in her mother.   ROS:   Please see the history of present illness.    ROS All other systems are reviewed and are negative.   PHYSICAL EXAM:   VS:  BP (!) 141/81   Pulse 60   Temp (!) 97.1 F (36.2 C)   Ht 5\' 2"  (1.575 m)   Wt 96 lb 12.8 oz (43.9 kg)   SpO2 100%   BMI 17.70 kg/m       General: Alert,  oriented x3, no distress, hunched over in the wheelchair, poorly interactive, but does respond to loud questions.  Underweight.  Left subclavian pacemaker site appears healthy. Head: no evidence of trauma, PERRL, EOMI, no exophtalmos or lid lag, no myxedema, no xanthelasma; normal ears, nose and oropharynx Neck: normal jugular venous pulsations and no hepatojugular reflux; brisk carotid pulses without delay and no carotid bruits Chest: clear to auscultation, no signs of consolidation by percussion or palpation, normal fremitus, symmetrical and full respiratory excursions Cardiovascular: normal position and quality of the apical impulse, regular rhythm, normal first and second heart sounds, no murmurs, rubs or gallops Abdomen: no tenderness or distention, no masses by palpation, no abnormal pulsatility or arterial bruits, normal bowel sounds, no hepatosplenomegaly Extremities: no clubbing, cyanosis or edema; 2+ radial, ulnar and brachial pulses bilaterally; 2+ right femoral, posterior tibial and dorsalis pedis pulses; 2+ left femoral, posterior tibial and dorsalis pedis pulses; no subclavian or femoral bruits Neurological: Blind and very hard of hearing Psych: Normal mood and affect    Wt Readings from Last 3 Encounters:  09/15/19 96 lb 12.8 oz (43.9 kg)  09/12/18 102 lb 9.6 oz (46.5 kg)  09/12/17 111 lb (50.3 kg)      Studies/Labs Reviewed:   EKG:  EKG is ordered today.  It shows atrial fibrillation in the background with 100% ventricular pacing.  Recent Labs: 04/26/2019: ALT 10; BUN 11; Creatinine, Ser 1.26; Hemoglobin 12.5; Platelets 191; Potassium 3.9; Sodium 139   Lipid Panel    Component Value Date/Time   CHOL 211 (H) 03/24/2016 0627   TRIG 60 03/24/2016 0627   HDL 74 03/24/2016 0627   CHOLHDL 2.9 03/24/2016 0627   VLDL 12 03/24/2016 0627   LDLCALC 125 (H) 03/24/2016 16100627     ASSESSMENT:    1. Permanent atrial fibrillation (HCC)   2. Pacemaker  3. Coronary artery  disease of native artery of native heart with stable angina pectoris ([place])   4. Essential hypertension      PLAN:  In order of problems listed above:  1. AFib: Permanent arrhythmia, slow AV conduction with almost 100% ventricular pacing.  Not on anticoagulation in view of advanced age and poor functional status. 2. PPM: Normal device function.  Estimated generator longevity just over 2 years.  Continue remote downloads every 3 months. 3. CAD: No complaints of chest discomfort recently. 4. HTN: Blood pressure control is adequate.  If her blood pressure begins to decrease as she continues to lose weight, would prefer to stop ramipril and continue her antianginal agents (amlodipine and carvedilol).    Medication Adjustments/Labs and Tests Ordered: Current medicines are reviewed at length with the patient today.  Concerns regarding medicines are outlined above.  Medication changes, Labs and Tests ordered today are listed in the Patient Instructions below. Patient Instructions  Medication Instructions:  No changes *If you need a refill on your cardiac medications before your next appointment, please call your pharmacy*  Lab Work: None ordered If you have labs (blood work) drawn today and your tests are completely normal, you will receive your results only by: Marland Kitchen MyChart Message (if you have MyChart) OR . A paper copy in the mail If you have any lab test that is abnormal or we need to change your treatment, we will call you to review the results.  Testing/Procedures: None ordered  Follow-Up: At [facility], you and your health needs are our priority.  As part of our continuing mission to provide you with exceptional heart care, we have created designated Provider Care Teams.  These Care Teams include your primary Cardiologist (physician) and Advanced Practice Providers (APPs -  Physician Assistants and Nurse Practitioners) who all work together to provide you with the care you need, when  you need it.  Your next appointment:   12 months  The format for your next appointment:   In Person  Provider:   Sanda Klein, MD      Signed, Sanda Klein, MD  09/17/2019 3:48 PM    [facility] [place], [place], [place]  [address] Phone: [phone]; Fax: [phone]

## 2019-09-15 NOTE — Patient Instructions (Signed)

## 2019-09-16 ENCOUNTER — Telehealth: Payer: Self-pay

## 2019-09-16 NOTE — Telephone Encounter (Signed)
-----   Message from Sanda Klein, MD sent at 09/15/2019 10:20 AM EST ----- Downloads not coming through. New phone connection. Sounds like she needs a wall filter or switch to cell transmitter, please. MCr

## 2019-09-16 NOTE — Telephone Encounter (Signed)
I spoke with the pt granddaughter Efraim Kaufmann and let her know I was going to get in touch with a [employer] rep to get her a cell adapter to her. I verified the pt address and it is correct in the system. I told her once she receives the adapter to send a transmission or to call my direct office number for me to help with the transmission.

## 2019-10-07 NOTE — Telephone Encounter (Signed)
I spoke with the pt granddaughter and she states the pt still have not receive the cell adapter for her home remote monitor. I sent a message a message to the [employer] rep to send another cell adapter. I did verify the address with the pt granddaughter.

## 2019-10-10 NOTE — Telephone Encounter (Signed)
Cell adapter mailed. Delivery date 10/11/2019.

## 2019-10-15 ENCOUNTER — Ambulatory Visit (INDEPENDENT_AMBULATORY_CARE_PROVIDER_SITE_OTHER): Payer: Medicare Other | Admitting: *Deleted

## 2019-10-15 DIAGNOSIS — Z950 Presence of cardiac pacemaker: Secondary | ICD-10-CM | POA: Diagnosis not present

## 2019-10-15 LAB — CUP PACEART REMOTE DEVICE CHECK
Date Time Interrogation Session: 20201216071446
Implantable Lead Implant Date: 20110923
Implantable Lead Implant Date: 20110923
Implantable Lead Location: 753859
Implantable Lead Location: 753860
Implantable Pulse Generator Implant Date: 20110923
Pulse Gen Model: 2210
Pulse Gen Serial Number: 2561895

## 2019-10-15 NOTE — Telephone Encounter (Signed)
Transmission received 10-15-2019

## 2019-10-15 NOTE — Telephone Encounter (Signed)
Transmission received 10/15/19 is actually old transmission from 01/22/18 that was "stuck". Will request manual transmission.

## 2019-10-17 ENCOUNTER — Telehealth: Payer: Self-pay

## 2019-10-17 NOTE — Telephone Encounter (Signed)
Transmission received 10/17/2019

## 2019-10-17 NOTE — Telephone Encounter (Signed)
I spoke with the pt granddaughter and she states she received the cell adapter. I asked her to send a transmission with the pt monitor and she agreed to do one.

## 2019-10-18 NOTE — Telephone Encounter (Signed)
Transmission received. See [employer]  Chanetta Marshall, NP 10/18/2019 7:48 AM

## 2019-10-20 NOTE — Telephone Encounter (Signed)
Transmission received 10/17/2019 

## 2019-11-03 ENCOUNTER — Emergency Department (HOSPITAL_COMMUNITY): Payer: Medicare PPO

## 2019-11-03 ENCOUNTER — Emergency Department (HOSPITAL_COMMUNITY)
Admission: EM | Admit: 2019-11-03 | Discharge: 2019-11-03 | Disposition: A | Discharge status: Home / Self Care | Payer: Medicare PPO | Attending: Emergency Medicine | Admitting: Emergency Medicine

## 2019-11-03 DIAGNOSIS — R109 Unspecified abdominal pain: Secondary | ICD-10-CM | POA: Diagnosis not present

## 2019-11-03 DIAGNOSIS — R130 Aphagia: Secondary | ICD-10-CM | POA: Insufficient documentation

## 2019-11-03 DIAGNOSIS — Z79899 Other long term (current) drug therapy: Secondary | ICD-10-CM | POA: Insufficient documentation

## 2019-11-03 DIAGNOSIS — Z950 Presence of cardiac pacemaker: Secondary | ICD-10-CM | POA: Insufficient documentation

## 2019-11-03 DIAGNOSIS — R112 Nausea with vomiting, unspecified: Secondary | ICD-10-CM | POA: Diagnosis not present

## 2019-11-03 DIAGNOSIS — I252 Old myocardial infarction: Secondary | ICD-10-CM | POA: Insufficient documentation

## 2019-11-03 DIAGNOSIS — Z955 Presence of coronary angioplasty implant and graft: Secondary | ICD-10-CM | POA: Diagnosis not present

## 2019-11-03 DIAGNOSIS — I259 Chronic ischemic heart disease, unspecified: Secondary | ICD-10-CM | POA: Diagnosis not present

## 2019-11-03 DIAGNOSIS — Z20822 Contact with and (suspected) exposure to covid-19: Secondary | ICD-10-CM | POA: Diagnosis not present

## 2019-11-03 DIAGNOSIS — F0390 Unspecified dementia without behavioral disturbance: Secondary | ICD-10-CM | POA: Insufficient documentation

## 2019-11-03 DIAGNOSIS — R1310 Dysphagia, unspecified: Secondary | ICD-10-CM

## 2019-11-03 DIAGNOSIS — I10 Essential (primary) hypertension: Secondary | ICD-10-CM | POA: Diagnosis not present

## 2019-11-03 DIAGNOSIS — I480 Paroxysmal atrial fibrillation: Secondary | ICD-10-CM | POA: Diagnosis not present

## 2019-11-03 DIAGNOSIS — J029 Acute pharyngitis, unspecified: Secondary | ICD-10-CM | POA: Diagnosis present

## 2019-11-03 LAB — URINALYSIS, ROUTINE W REFLEX MICROSCOPIC
Bilirubin Urine: NEGATIVE
Glucose, UA: NEGATIVE mg/dL
Hgb urine dipstick: NEGATIVE
Ketones, ur: NEGATIVE mg/dL
Leukocytes,Ua: NEGATIVE
Nitrite: NEGATIVE
Protein, ur: NEGATIVE mg/dL
Specific Gravity, Urine: 1.011 (ref 1.005–1.030)
pH: 7 (ref 5.0–8.0)

## 2019-11-03 LAB — COMPREHENSIVE METABOLIC PANEL
ALT: 11 U/L (ref 0–44)
AST: 17 U/L (ref 15–41)
Albumin: 3.6 g/dL (ref 3.5–5.0)
Alkaline Phosphatase: 46 U/L (ref 38–126)
Anion gap: 10 (ref 5–15)
BUN: 12 mg/dL (ref 8–23)
CO2: 24 mmol/L (ref 22–32)
Calcium: 9 mg/dL (ref 8.9–10.3)
Chloride: 106 mmol/L (ref 98–111)
Creatinine, Ser: 1.13 mg/dL — ABNORMAL HIGH (ref 0.44–1.00)
GFR calc Af Amer: 47 mL/min — ABNORMAL LOW (ref >60)
GFR calc non Af Amer: 40 mL/min — ABNORMAL LOW (ref >60)
Glucose, Bld: 89 mg/dL (ref 70–99)
Potassium: 4 mmol/L (ref 3.5–5.1)
Sodium: 140 mmol/L (ref 135–145)
Total Bilirubin: 0.6 mg/dL (ref 0.3–1.2)
Total Protein: 5.8 g/dL — ABNORMAL LOW (ref 6.5–8.1)

## 2019-11-03 LAB — CBC WITH DIFFERENTIAL/PLATELET
Abs Immature Granulocytes: 0.01 10*3/uL (ref 0.00–0.07)
Basophils Absolute: 0 10*3/uL (ref 0.0–0.1)
Basophils Relative: 1 %
Eosinophils Absolute: 0.1 10*3/uL (ref 0.0–0.5)
Eosinophils Relative: 1 %
HCT: 39.6 % (ref 36.0–46.0)
Hemoglobin: 12.4 g/dL (ref 12.0–15.0)
Immature Granulocytes: 0 %
Lymphocytes Relative: 33 %
Lymphs Abs: 1.2 10*3/uL (ref 0.7–4.0)
MCH: 30.8 pg (ref 26.0–34.0)
MCHC: 31.3 g/dL (ref 30.0–36.0)
MCV: 98.3 fL (ref 80.0–100.0)
Monocytes Absolute: 0.4 10*3/uL (ref 0.1–1.0)
Monocytes Relative: 10 %
Neutro Abs: 1.9 10*3/uL (ref 1.7–7.7)
Neutrophils Relative %: 55 %
Platelets: 174 10*3/uL (ref 150–400)
RBC: 4.03 MIL/uL (ref 3.87–5.11)
RDW: 14.3 % (ref 11.5–15.5)
WBC: 3.5 10*3/uL — ABNORMAL LOW (ref 4.0–10.5)
nRBC: 0 % (ref 0.0–0.2)

## 2019-11-03 LAB — I-STAT CHEM 8, ED
BUN: 13 mg/dL (ref 8–23)
Calcium, Ion: 1.1 mmol/L — ABNORMAL LOW (ref 1.15–1.40)
Chloride: 106 mmol/L (ref 98–111)
Creatinine, Ser: 1 mg/dL (ref 0.44–1.00)
Glucose, Bld: 84 mg/dL (ref 70–99)
HCT: 38 % (ref 36.0–46.0)
Hemoglobin: 12.9 g/dL (ref 12.0–15.0)
Potassium: 3.9 mmol/L (ref 3.5–5.1)
Sodium: 140 mmol/L (ref 135–145)
TCO2: 24 mmol/L (ref 22–32)

## 2019-11-03 LAB — LIPASE, BLOOD: Lipase: 23 U/L (ref 11–51)

## 2019-11-03 LAB — TROPONIN I (HIGH SENSITIVITY): Troponin I (High Sensitivity): 14 ng/L (ref <18)

## 2019-11-03 LAB — SARS CORONAVIRUS 2 (TAT 6-24 HRS): SARS Coronavirus 2: NEGATIVE

## 2019-11-03 MED ORDER — ONDANSETRON HCL 4 MG/2ML IJ SOLN
4.0000 mg | Freq: Once | INTRAMUSCULAR | Status: AC
  Administered 2019-11-03: 03:00:00 4 mg via INTRAVENOUS
  Filled 2019-11-03: qty 2

## 2019-11-03 MED ORDER — NYSTATIN 100000 UNIT/ML MT SUSP: 500000.0000 [IU] | Freq: Four times a day (QID) | OROMUCOSAL | 0 refills | Status: DC

## 2019-11-03 MED ORDER — IOHEXOL 300 MG/ML  SOLN
100.0000 mL | Freq: Once | INTRAMUSCULAR | Status: AC | PRN
  Administered 2019-11-03: 100 mL via INTRAVENOUS

## 2019-11-03 MED ORDER — NYSTATIN 100000 UNIT/ML MT SUSP
5.0000 mL | Freq: Once | OROMUCOSAL | Status: AC
  Administered 2019-11-03: 06:00:00 500000 [IU] via ORAL
  Filled 2019-11-03: qty 5

## 2019-11-03 NOTE — ED Provider Notes (Signed)
[facility] EMERGENCY DEPARTMENT Provider Note   CSN: [id number] Arrival date & time: 11/03/19  0204     History Chief Complaint  Patient presents with  . Abdominal Pain    [name] is a 86 y.o. female.  Patient presents to the emergency department with a chief complaint of sore throat, cough, and abdominal pain.  She has history of dementia.  History limited secondary to dementia and hard of hearing.  I contacted the granddaughter, who is the healthcare power of attorney, he tells me that the patient has been complaining of abdominal pain and has been spitting up and having sore throat for the past 2 days.  Has been afebrile at home.  Granddaughter reports that she had something similar back in June of last year, and was diagnosed with thrush.  The history is provided by the patient. No language interpreter was used.       Past Medical History:  Diagnosis Date  . Anemia   . Angina    on nitro-dur  . Coronary artery disease   . Elevated troponin   . GERD (gastroesophageal reflux disease)   . Glaucoma   . H/O myocardial perfusion scan 07/13/2011   The post stress left ventricle is normal in size. the rest left ventricle is normal in size. there is no scintigraphic evidence of inducible myocardial ishemia. The post-stress left ventricular function is normal. the post-stress ejection fraction is 66%.This is a low risk scan.  Marland Kitchen History of eye surgery    bilateral   . HOH (hard of hearing)   . Hyperlipidemia   . Hypertension   . Myocardial infarction (HCC)    x 3  . Pacemaker   . Rotator cuff tear    chronic  . Shortness of breath    with exertion  . SSS (sick sinus syndrome) (HCC) 07/25/2016    Patient Active Problem List   Diagnosis Date Noted  . Impingement syndrome of left shoulder 09/15/2016  . SSS (sick sinus syndrome) (HCC) 07/25/2016  . Paroxysmal atrial fibrillation (HCC) 07/25/2016  . Elevated troponin 03/24/2016  . Left shoulder pain  03/24/2016  . Rotator cuff tear   . HOH (hard of hearing)   . Left arm pain   . Unstable angina (HCC) 11/18/2015  . Chest pain 12/17/2014  . CAD (coronary artery disease) 04/11/2013  . HTN (hypertension) 04/11/2013  . Pacemaker   . Hyperlipidemia     Past Surgical History:  Procedure Laterality Date  . APPENDECTOMY    . cardiac catherization  06/2010  . CARDIAC CATHETERIZATION    . CARDIAC CATHETERIZATION Left 07/19/2010   LV showed good LV systolic function, LVH EF of 60 % to 65 %. Left main was patent. LAD has 20 % to 30 % proximal stenosis and 99 % mid stenosis with TIMI 2 distal flow. Diagonal-1 was very, very small. Diagonal-2 was very small, Diagonal-3 was small which has 30 % to 40 % ostial stenosis just before the bifurcation with LAD stenosis. Left circumflex has 40 % to 50 % mid stenosis and 70 % to 75 % .  Marland Kitchen CORONARY ANGIOPLASTY WITH STENT PLACEMENT  02/13/2012  . HYSTEROSCOPY WITH D & C  09/07/2011   Procedure: DILATATION AND CURETTAGE (D&C) /HYSTEROSCOPY;  Surgeon: Turner Daniels, MD;  Location: [facility] ORS;  Service: Gynecology;  Laterality: N/A;  uterus  . INSERT / REPLACE / REMOVE PACEMAKER    . LEFT HEART CATHETERIZATION WITH CORONARY ANGIOGRAM N/A 01/25/2012  Procedure: LEFT HEART CATHETERIZATION WITH CORONARY ANGIOGRAM;  Surgeon: Robynn Pane, MD;  Location: [facility] CATH LAB;  Service: Cardiovascular;  Laterality: N/A;  . LEFT HEART CATHETERIZATION WITH CORONARY ANGIOGRAM N/A 12/18/2014   Procedure: LEFT HEART CATHETERIZATION WITH CORONARY ANGIOGRAM;  Surgeon: Robynn Pane, MD;  Location: [facility] CATH LAB;  Service: Cardiovascular;  Laterality: N/A;  . PACEMAKER INSERTION  06/2010  . PERCUTANEOUS CORONARY STENT INTERVENTION (PCI-S) N/A 02/13/2012   Procedure: PERCUTANEOUS CORONARY STENT INTERVENTION (PCI-S);  Surgeon: Robynn Pane, MD;  Location: [facility] CATH LAB;  Service: Cardiovascular;  Laterality: N/A;  . svd     x 2  . THYROIDECTOMY       OB History   No obstetric history on  file.     Family History  Problem Relation Age of Onset  . Diabetes Mother   . Cancer Sister     Social History   Tobacco Use  . Smoking status: Former Smoker    Packs/day: 0.50    Types: Cigarettes    Quit date: 09/05/1991    Years since quitting: 28.1  . Smokeless tobacco: Never Used  Substance Use Topics  . Alcohol use: No    Comment: none since 1992  . Drug use: No    Home Medications Prior to Admission medications   Medication Sig Start Date End Date Taking? Authorizing Provider  acetaminophen (TYLENOL) 500 MG tablet Take 500 mg by mouth every 4 (four) hours as needed for mild pain (pain).     [provider]  amLODipine (NORVASC) 5 MG tablet Take 5 mg by mouth daily.    [provider]  carvedilol (COREG) 12.5 MG tablet TAKE 1 TABLET BY MOUTH TWICE DAILY WITH A MEAL 09/24/18   Croitoru, Mihai, MD  conjugated estrogens (PREMARIN) vaginal cream Place 1 Applicatorful vaginally 3 (three) times a week. Use at bedtime on Monday, Wednesday, Friday    [provider]  Dorzolamide HCl-Timolol Mal PF (COSOPT PF) 22.3-6.8 MG/ML SOLN Place 1 drop into both eyes 2 (two) times daily.    [provider]  famotidine (PEPCID) 20 MG tablet Take 20 mg by mouth 2 (two) times daily as needed for heartburn.     [provider]  furosemide (LASIX) 20 MG tablet Take 40 mg by mouth daily.  05/09/14   [provider]  gabapentin (NEURONTIN) 100 MG capsule Take 1 capsule (100 mg total) by mouth 3 (three) times daily as needed (pain). 03/26/16   Kathlen Mody, MD  HYDROcodone-acetaminophen (NORCO/VICODIN) 5-325 MG tablet Take 0.5 tablets by mouth every 6 (six) hours as needed for moderate pain.    [provider]  hydrocortisone (PROCTOZONE-HC) 2.5 % rectal cream Place 1 application rectally 2 (two) times daily as needed for hemorrhoids or itching.    [provider]  lidocaine (XYLOCAINE) 5 % ointment Apply 1 application topically  as needed for mild pain (for rectal pain).    [provider]  nitroGLYCERIN (NITRODUR - DOSED IN MG/24 HR) 0.4 mg/hr patch Place 0.4 mg onto the skin every morning. REMOVE EVERY NIGHT    [provider]  ramipril (ALTACE) 10 MG capsule Take 10 mg by mouth daily.     [provider]  sucralfate (CARAFATE) 1 g tablet Take 1 tablet (1 g total) by mouth 4 (four) times daily as needed. 04/26/19   Sabas Sous, MD  Tafluprost (ZIOPTAN) 0.0015 % SOLN Place 1 drop into both eyes at bedtime.     [provider]    Allergies    Influenza vac split [flu virus vaccine], Tramadol, and Shellfish allergy  Review of Systems   Review of Systems  Unable to perform ROS: Dementia    Physical Exam Updated Vital Signs BP (!) 143/91 (BP Location: Left Arm)   Temp 97.8 F (36.6 C) (Oral)   Resp (!) 22   SpO2 96%   Physical Exam Vitals and nursing note reviewed.  Constitutional:      General: She is not in acute distress.    Comments: Frail  HENT:     Head: Normocephalic and atraumatic.     Mouth/Throat:     Comments: Mild erythema of oropharynx Eyes:     Conjunctiva/sclera: Conjunctivae normal.  Cardiovascular:     Rate and Rhythm: Normal rate and regular rhythm.     Heart sounds: No murmur.  Pulmonary:     Effort: Pulmonary effort is normal. No respiratory distress.     Breath sounds: Normal breath sounds.     Comments: Lung sounds are clear Abdominal:     Palpations: Abdomen is soft.     Tenderness: There is no abdominal tenderness.     Comments: No focal abdominal tenderness on exam  Musculoskeletal:     Cervical back: Neck supple.     Comments: Moves extremities, no deformities  Skin:    General: Skin is warm and dry.  Neurological:     Mental Status: She is alert.     Comments: Awake, demented  Psychiatric:        Mood and Affect: Mood normal.        Behavior: Behavior normal.     ED Results / Procedures / Treatments   Labs (all labs  ordered are listed, but only abnormal results are displayed) Labs Reviewed  CBC WITH DIFFERENTIAL/PLATELET - Abnormal; Notable for the following components:      Result Value   WBC 3.5 (*)    All other components within normal limits  COMPREHENSIVE METABOLIC PANEL - Abnormal; Notable for the following components:   Creatinine, Ser 1.13 (*)    Total Protein 5.8 (*)    GFR calc non Af Amer 40 (*)    GFR calc Af Amer 47 (*)    All other components within normal limits  I-STAT CHEM 8, ED - Abnormal; Notable for the following components:   Calcium, Ion 1.10 (*)    All other components within normal limits  SARS CORONAVIRUS 2 (TAT 6-24 HRS)  URINALYSIS, ROUTINE W REFLEX MICROSCOPIC  LIPASE, BLOOD  TROPONIN I (HIGH SENSITIVITY)    EKG EKG Interpretation  Date/Time:  Monday November 03 2019 03:14:16 EST Ventricular Rate:  61 PR Interval:    QRS Duration: 129 QT Interval:  484 QTC Calculation: 488 R Axis:   -67 Text Interpretation: atrial-sensed ventricular-paced complexes No significant change since last tracing Abnormal ECG Confirmed by Ripley Fraise [phone]) on 11/03/2019 3:29:51 AM   Radiology CT ABDOMEN PELVIS W CONTRAST  Result Date: 11/03/2019 CLINICAL DATA:  Nausea and vomiting EXAM: CT ABDOMEN AND PELVIS WITH CONTRAST TECHNIQUE: Multidetector CT imaging of the abdomen and pelvis was performed using the standard protocol following bolus administration of intravenous contrast. CONTRAST:  164mL OMNIPAQUE IOHEXOL 300 MG/ML  SOLN COMPARISON:  04/26/2019 FINDINGS: Lower chest: Cardiomegaly and chronic low-density pericardial effusion greatest along the right atrium where thickness is 1 cm. There is pacer lead into the right ventricle. Hepatobiliary: No focal liver abnormality.No evidence of biliary obstruction or stone. Pancreas: Unremarkable.  Spleen: Unremarkable. Adrenals/Urinary Tract: Negative adrenals. No hydronephrosis or stone. Symmetric generalized renal atrophy. Unremarkable  bladder. Stomach/Bowel: No obstruction. No evidence of bowel inflammation. Apparent thickening at the level of the rectum is attributed to a fold-a thin ridge was seen in the same location previously. Retrocecal appendix which is negative for appendicitis. Vascular/Lymphatic: No acute vascular abnormality. Diffuse atherosclerotic plaque of the aorta and branch vessels. No mass or adenopathy. Reproductive:No pathologic findings. Other: No ascites or pneumoperitoneum.  Anasarca. Musculoskeletal: Advanced lumbar spine degeneration with levoscoliosis. Lipoma appearance along the deep right gluteus maximus. IMPRESSION: 1. No acute finding including bowel obstruction. 2. Cardiomegaly and small pericardial effusion. 3.  Aortic Atherosclerosis (ICD10-I70.0). Electronically Signed   By: Marnee Spring M.D.   On: 11/03/2019 04:16   DG Chest Port 1 View  Result Date: 11/03/2019 CLINICAL DATA:  Chest pain EXAM: PORTABLE CHEST 1 VIEW COMPARISON:  04/26/2019 FINDINGS: Left-sided pacing device as before. Stable enlargement of the cardiomediastinal silhouette. No consolidation or effusion. Aortic atherosclerosis. No pneumothorax. IMPRESSION: No active disease.  Stable cardiomegaly. Electronically Signed   By: Jasmine Pang M.D.   On: 11/03/2019 02:59    Procedures Procedures (including critical care time)  Medications Ordered in ED Medications  ondansetron (ZOFRAN) injection 4 mg (has no administration in time range)    ED Course  I have reviewed the triage vital signs and the nursing notes.  Pertinent labs & imaging results that were available during my care of the patient were reviewed by me and considered in my medical decision making (see chart for details).    MDM Rules/Calculators/A&P                      Patient here with abdominal pain.  Also has complaints of sore throat and cough.  Afebrile.  History of thrush with similar presentation.  She does have some redness of her oropharynx, but no lesions  or exudates.  We will check labs, chest x-ray, and CT abdomen/pelvis.  I discussed the patient with her granddaughter, Tilman Neat, who is healthcare power of attorney.  We discussed goals of care.  At this time, patient's granddaughter would like [place] to have patient be DNR/DNI.  She does NOT want [place] to perform cardiopulmonary resuscitation or put the patient on a breathing machine.  She WOULD like for her grandmother to receive medications through IV, laboratory testing, and imaging.  Patient seen by and discussed with Dr. Bebe Shaggy.  6:00 AM Patient tolerating oral intake.  Laboratory work-up is reassuring.  Electrolytes are normal.  Mild leukopenia of 3.5.  Urinalysis is clear, no evidence of infection.  CT of the abdomen shows no acute abdominal process.  Patient does have some redness of the oropharynx, this could be her thrush, which she experienced earlier in the year.  Will prescribe nystatin.  Final Clinical Impression(s) / ED Diagnoses Final diagnoses:  Odynophagia    Rx / DC Orders ED Discharge Orders    None       Roxy Horseman, PA-C 11/03/19 0601    Zadie Rhine, MD 11/03/19 [phone]

## 2019-11-03 NOTE — Discharge Instructions (Addendum)
No emergent causes for your symptoms were found tonight.  You do have some redness of your throat, which could be oral candidiasis or thrush, which you have had in the past.  You have been given a prescription to treat this.  See your doctor in 3 days if not improving.  If you get worse, return to the ER.

## 2019-11-03 NOTE — ED Provider Notes (Signed)
Patient seen/examined in the Emergency Department in conjunction with Advanced Practice Provider [facility] Patient reports abdominal pain and dry heaving Exam : awake/alert, frail, elderly, appears uncomfortable Plan: workup in process, likely CT abdpelvis imaging    Zadie Rhine, MD 11/03/19 0330

## 2019-11-03 NOTE — ED Triage Notes (Signed)
Came in via EMS: c/o sudden onset of abdominal pain, dry heaving, nausea and headache,. Reported hx of alzheimer's dementia; visual and hearing impaired. Patient is saying "I can't bring up nothing now" and continues to spit.

## 2019-11-05 NOTE — Progress Notes (Signed)
PPM Remote  

## 2019-12-08 ENCOUNTER — Ambulatory Visit: Payer: Medicare PPO | Attending: Internal Medicine

## 2019-12-08 DIAGNOSIS — Z23 Encounter for immunization: Secondary | ICD-10-CM

## 2019-12-08 NOTE — Progress Notes (Signed)
   Covid-19 Vaccination Clinic  Name:  [name]    MRN: [id number] DOB: 1918-07-04  12/08/2019  [name] was observed post Covid-19 immunization for 15 minutes without incidence. She was provided with Vaccine Information Sheet and instruction to access the V-Safe system.   [name] was instructed to call 911 with any severe reactions post vaccine: Marland Kitchen Difficulty breathing  . Swelling of your face and throat  . A fast heartbeat  . A bad rash all over your body  . Dizziness and weakness    Immunizations Administered    Name Date Dose VIS Date Route   Pfizer COVID-19 Vaccine 12/08/2019  3:10 PM 0.3 mL 10/10/2019 Intramuscular   Manufacturer: [employer], [employer]   Lot: [id number]   NDC: [address]-5

## 2020-01-02 ENCOUNTER — Ambulatory Visit: Payer: Medicare PPO | Attending: Internal Medicine

## 2020-01-02 DIAGNOSIS — Z23 Encounter for immunization: Secondary | ICD-10-CM | POA: Insufficient documentation

## 2020-01-02 NOTE — Progress Notes (Signed)
   Covid-19 Vaccination Clinic  Name:  [name]    MRN: [id number] DOB: 10-14-1918  01/02/2020  [name] was observed post Covid-19 immunization for 15 minutes without incident. She was provided with Vaccine Information Sheet and instruction to access the V-Safe system.   [name] was instructed to call 911 with any severe reactions post vaccine: Marland Kitchen Difficulty breathing  . Swelling of face and throat  . A fast heartbeat  . A bad rash all over body  . Dizziness and weakness   Immunizations Administered    Name Date Dose VIS Date Route   Pfizer COVID-19 Vaccine 01/02/2020  2:50 PM 0.3 mL 10/10/2019 Intramuscular   Manufacturer: [employer], [employer]   Lot: [id number]   NDC: [address]-4

## 2020-01-14 ENCOUNTER — Ambulatory Visit (INDEPENDENT_AMBULATORY_CARE_PROVIDER_SITE_OTHER): Payer: Medicare PPO | Admitting: *Deleted

## 2020-01-14 DIAGNOSIS — Z950 Presence of cardiac pacemaker: Secondary | ICD-10-CM | POA: Diagnosis not present

## 2020-01-14 LAB — CUP PACEART REMOTE DEVICE CHECK
Battery Remaining Longevity: 22 mo
Battery Remaining Percentage: 15 %
Battery Voltage: 2.74 V
Brady Statistic RV Percent Paced: 94 %
Date Time Interrogation Session: 20210317024948
Implantable Lead Implant Date: 20110923
Implantable Lead Implant Date: 20110923
Implantable Lead Location: 753859
Implantable Lead Location: 753860
Implantable Pulse Generator Implant Date: 20110923
Lead Channel Impedance Value: 460 Ohm
Lead Channel Pacing Threshold Amplitude: 0.625 V
Lead Channel Pacing Threshold Pulse Width: 0.4 ms
Lead Channel Sensing Intrinsic Amplitude: 12 mV
Lead Channel Setting Pacing Amplitude: 0.875
Lead Channel Setting Pacing Pulse Width: 0.4 ms
Lead Channel Setting Sensing Sensitivity: 2 mV
Pulse Gen Model: 2210
Pulse Gen Serial Number: 2561895

## 2020-01-15 NOTE — Progress Notes (Signed)
PPM Remote  

## 2020-04-14 ENCOUNTER — Ambulatory Visit (INDEPENDENT_AMBULATORY_CARE_PROVIDER_SITE_OTHER): Payer: Medicare PPO | Admitting: *Deleted

## 2020-04-14 DIAGNOSIS — I495 Sick sinus syndrome: Secondary | ICD-10-CM | POA: Diagnosis not present

## 2020-04-16 LAB — CUP PACEART REMOTE DEVICE CHECK
Battery Remaining Longevity: 16 mo
Battery Remaining Percentage: 10 %
Battery Voltage: 2.71 V
Brady Statistic RV Percent Paced: 95 %
Date Time Interrogation Session: 20210617212127
Implantable Lead Implant Date: 20110923
Implantable Lead Implant Date: 20110923
Implantable Lead Location: 753859
Implantable Lead Location: 753860
Implantable Pulse Generator Implant Date: 20110923
Lead Channel Impedance Value: 390 Ohm
Lead Channel Pacing Threshold Amplitude: 0.625 V
Lead Channel Pacing Threshold Pulse Width: 0.4 ms
Lead Channel Sensing Intrinsic Amplitude: 12 mV
Lead Channel Setting Pacing Amplitude: 0.875
Lead Channel Setting Pacing Pulse Width: 0.4 ms
Lead Channel Setting Sensing Sensitivity: 2 mV
Pulse Gen Model: 2210
Pulse Gen Serial Number: 2561895

## 2020-04-16 NOTE — Progress Notes (Signed)
Remote pacemaker transmission.   

## 2020-05-04 DIAGNOSIS — I70293 Other atherosclerosis of native arteries of extremities, bilateral legs: Secondary | ICD-10-CM | POA: Diagnosis not present

## 2020-05-04 DIAGNOSIS — R609 Edema, unspecified: Secondary | ICD-10-CM | POA: Diagnosis not present

## 2020-05-04 DIAGNOSIS — I739 Peripheral vascular disease, unspecified: Secondary | ICD-10-CM | POA: Diagnosis not present

## 2020-05-04 DIAGNOSIS — R262 Difficulty in walking, not elsewhere classified: Secondary | ICD-10-CM | POA: Diagnosis not present

## 2020-05-04 DIAGNOSIS — L602 Onychogryphosis: Secondary | ICD-10-CM | POA: Diagnosis not present

## 2020-05-06 DIAGNOSIS — I70223 Atherosclerosis of native arteries of extremities with rest pain, bilateral legs: Secondary | ICD-10-CM | POA: Diagnosis not present

## 2020-05-06 DIAGNOSIS — I70203 Unspecified atherosclerosis of native arteries of extremities, bilateral legs: Secondary | ICD-10-CM | POA: Diagnosis not present

## 2020-05-06 DIAGNOSIS — I739 Peripheral vascular disease, unspecified: Secondary | ICD-10-CM | POA: Diagnosis not present

## 2020-06-22 DIAGNOSIS — R443 Hallucinations, unspecified: Secondary | ICD-10-CM | POA: Diagnosis not present

## 2020-06-22 DIAGNOSIS — R609 Edema, unspecified: Secondary | ICD-10-CM | POA: Diagnosis not present

## 2020-07-01 DIAGNOSIS — R8290 Unspecified abnormal findings in urine: Secondary | ICD-10-CM | POA: Diagnosis not present

## 2020-07-08 DIAGNOSIS — I495 Sick sinus syndrome: Secondary | ICD-10-CM | POA: Diagnosis not present

## 2020-07-08 DIAGNOSIS — I5020 Unspecified systolic (congestive) heart failure: Secondary | ICD-10-CM | POA: Diagnosis not present

## 2020-07-08 DIAGNOSIS — E785 Hyperlipidemia, unspecified: Secondary | ICD-10-CM | POA: Diagnosis not present

## 2020-07-08 DIAGNOSIS — I10 Essential (primary) hypertension: Secondary | ICD-10-CM | POA: Diagnosis not present

## 2020-07-08 DIAGNOSIS — I25118 Atherosclerotic heart disease of native coronary artery with other forms of angina pectoris: Secondary | ICD-10-CM | POA: Diagnosis not present

## 2020-07-09 ENCOUNTER — Telehealth: Payer: Self-pay | Admitting: Cardiovascular Disease

## 2020-07-09 ENCOUNTER — Other Ambulatory Visit: Payer: Self-pay

## 2020-07-09 MED ORDER — CARVEDILOL 12.5 MG PO TABS: ORAL_TABLET | ORAL | 0 refills | Status: DC

## 2020-07-09 NOTE — Telephone Encounter (Signed)
*  STAT* If patient is at the pharmacy, call can be transferred to refill team.   1. Which medications need to be refilled? (please list name of each medication and dose if known) Carvedilol 12.5  2. Which pharmacy/location (including street and city if local pharmacy) is medication to be sent to? [facility]   3. Do they need a 30 day or 90 day supply? 90

## 2020-07-14 ENCOUNTER — Ambulatory Visit (INDEPENDENT_AMBULATORY_CARE_PROVIDER_SITE_OTHER): Payer: Medicare PPO | Admitting: *Deleted

## 2020-07-14 DIAGNOSIS — I495 Sick sinus syndrome: Secondary | ICD-10-CM | POA: Diagnosis not present

## 2020-07-14 LAB — CUP PACEART REMOTE DEVICE CHECK
Battery Remaining Longevity: 10 mo
Battery Remaining Percentage: 6 %
Battery Voltage: 2.68 V
Brady Statistic RV Percent Paced: 95 %
Date Time Interrogation Session: 20210915035256
Implantable Lead Implant Date: 20110923
Implantable Lead Implant Date: 20110923
Implantable Lead Location: 753859
Implantable Lead Location: 753860
Implantable Pulse Generator Implant Date: 20110923
Lead Channel Impedance Value: 410 Ohm
Lead Channel Pacing Threshold Amplitude: 0.75 V
Lead Channel Pacing Threshold Pulse Width: 0.4 ms
Lead Channel Sensing Intrinsic Amplitude: 12 mV
Lead Channel Setting Pacing Amplitude: 1 V
Lead Channel Setting Pacing Pulse Width: 0.4 ms
Lead Channel Setting Sensing Sensitivity: 2 mV
Pulse Gen Model: 2210
Pulse Gen Serial Number: 2561895

## 2020-07-16 NOTE — Progress Notes (Signed)
Remote pacemaker transmission.   

## 2020-07-26 DIAGNOSIS — L602 Onychogryphosis: Secondary | ICD-10-CM | POA: Diagnosis not present

## 2020-07-26 DIAGNOSIS — I739 Peripheral vascular disease, unspecified: Secondary | ICD-10-CM | POA: Diagnosis not present

## 2020-08-07 DIAGNOSIS — Z23 Encounter for immunization: Secondary | ICD-10-CM | POA: Diagnosis not present

## 2020-08-18 DIAGNOSIS — K219 Gastro-esophageal reflux disease without esophagitis: Secondary | ICD-10-CM | POA: Diagnosis not present

## 2020-08-18 DIAGNOSIS — K1379 Other lesions of oral mucosa: Secondary | ICD-10-CM | POA: Diagnosis not present

## 2020-08-28 ENCOUNTER — Ambulatory Visit: Payer: Medicare PPO | Attending: Internal Medicine

## 2020-08-28 DIAGNOSIS — Z23 Encounter for immunization: Secondary | ICD-10-CM

## 2020-08-28 NOTE — Progress Notes (Signed)
   Covid-19 Vaccination Clinic  Name:  [name]    MRN: [id number] DOB: 10-23-1918  08/28/2020  [name] was observed post Covid-19 immunization for 15 minutes without incident. She was provided with Vaccine Information Sheet and instruction to access the V-Safe system.   [name] was instructed to call 911 with any severe reactions post vaccine: Marland Kitchen Difficulty breathing  . Swelling of face and throat  . A fast heartbeat  . A bad rash all over body  . Dizziness and weakness

## 2020-09-20 ENCOUNTER — Encounter: Payer: Self-pay | Admitting: Cardiovascular Disease

## 2020-09-20 ENCOUNTER — Ambulatory Visit (INDEPENDENT_AMBULATORY_CARE_PROVIDER_SITE_OTHER): Payer: Medicare PPO | Admitting: Cardiovascular Disease

## 2020-09-20 VITALS — BP 140/80 | HR 60 | Wt 98.0 lb

## 2020-09-20 DIAGNOSIS — I25118 Atherosclerotic heart disease of native coronary artery with other forms of angina pectoris: Secondary | ICD-10-CM | POA: Diagnosis not present

## 2020-09-20 DIAGNOSIS — I10 Essential (primary) hypertension: Secondary | ICD-10-CM | POA: Diagnosis not present

## 2020-09-20 DIAGNOSIS — Z950 Presence of cardiac pacemaker: Secondary | ICD-10-CM

## 2020-09-20 DIAGNOSIS — I495 Sick sinus syndrome: Secondary | ICD-10-CM | POA: Diagnosis not present

## 2020-09-20 DIAGNOSIS — I441 Atrioventricular block, second degree: Secondary | ICD-10-CM

## 2020-09-20 DIAGNOSIS — I4819 Other persistent atrial fibrillation: Secondary | ICD-10-CM

## 2020-09-20 NOTE — Progress Notes (Signed)
Cardiology Office Note    Date:  09/20/2020   ID:  [name], DOB [birth year], MRN [id number]  PCP:  [name].Amy Saucer, MD  Cardiologist:  Rinaldo Cloud, MD; Thurmon Fair, MD   Chief Complaint  Patient presents with  . Pacemaker Check  . Atrial Fibrillation    History of Present Illness:  [name] is a 86 y.o. female with sinus node dysfunction and AV conduction disease presenting for pacemaker follow-up. She also has paroxysmal atrial fibrillation, coronary artery disease s/p her septal infarction and LAD stent in 2011, stent to left circumflex in 2013, hyperlipidemia and hypertension for which she is followed by Dr. [name].  As always she is accompanied by her granddaughter, [name].  [name] is now completely blind and is also very hard of hearing.  She refuses to wear her hearing aids and communication is very difficult.  She is extremely sedentary, but otherwise is doing quite well.  She does not have any complaints of shortness of breath or angina, leg edema, new focal neurological complaints, orthopnea, PND or syncope.  For a long time she had persistent atrial fibrillation, which appeared to have become a permanent arrhythmia.  Today she presents with sinus bradycardia.  Her pacemaker was programmed VVIR so toward assay when this rhythm change occurred.  Device is reprogrammed to DDDR today.  Even with maximum prolongation of the AV delay times she does not conduct from the atrium to the ventricle at a heart rate of 60 bpm.  However, the underlying rhythm is severe sinus bradycardia at about 44 bpm during which she has almost complete 1: 1 conduction, just intermittent occasional second-degree AV block.  While programmed VVIR with a lower rate of 60 bpm, her device had 95% ventricular pacing.  Battery longevity was estimated at about 7 months, decreased to 6.4 months when we switched to DDDR. Her pacemaker is a [employer] accent DR RF device was implanted in 2011.   Past  Medical History:  Diagnosis Date  . Anemia   . Angina    on nitro-dur  . Coronary artery disease   . Elevated troponin   . GERD (gastroesophageal reflux disease)   . Glaucoma   . H/O myocardial perfusion scan 07/13/2011   The post stress left ventricle is normal in size. the rest left ventricle is normal in size. there is no scintigraphic evidence of inducible myocardial ishemia. The post-stress left ventricular function is normal. the post-stress ejection fraction is 66%.This is a low risk scan.  Marland Kitchen History of eye surgery    bilateral   . HOH (hard of hearing)   . Hyperlipidemia   . Hypertension   . Myocardial infarction (HCC)    x 3  . Pacemaker   . Rotator cuff tear    chronic  . Shortness of breath    with exertion  . SSS (sick sinus syndrome) (HCC) 07/25/2016    Past Surgical History:  Procedure Laterality Date  . APPENDECTOMY    . cardiac catherization  06/2010  . CARDIAC CATHETERIZATION    . CARDIAC CATHETERIZATION Left 07/19/2010   LV showed good LV systolic function, LVH EF of 60 % to 65 %. Left main was patent. LAD has 20 % to 30 % proximal stenosis and 99 % mid stenosis with TIMI 2 distal flow. Diagonal-1 was very, very small. Diagonal-2 was very small, Diagonal-3 was small which has 30 % to 40 % ostial stenosis just before the bifurcation with LAD stenosis. Left circumflex  has 40 % to 50 % mid stenosis and 70 % to 75 % .  Marland Kitchen CORONARY ANGIOPLASTY WITH STENT PLACEMENT  02/13/2012  . HYSTEROSCOPY WITH D & C  09/07/2011   Procedure: DILATATION AND CURETTAGE (D&C) /HYSTEROSCOPY;  Surgeon: Turner Daniels, MD;  Location: [facility];  Service: Gynecology;  Laterality: N/A;  uterus  . INSERT / REPLACE / REMOVE PACEMAKER    . LEFT HEART CATHETERIZATION WITH CORONARY ANGIOGRAM N/A 01/25/2012   Procedure: LEFT HEART CATHETERIZATION WITH CORONARY ANGIOGRAM;  Surgeon: Robynn Pane, MD;  Location: [facility] CATH LAB;  Service: Cardiovascular;  Laterality: N/A;  . LEFT HEART CATHETERIZATION WITH  CORONARY ANGIOGRAM N/A 12/18/2014   Procedure: LEFT HEART CATHETERIZATION WITH CORONARY ANGIOGRAM;  Surgeon: Robynn Pane, MD;  Location: [facility] CATH LAB;  Service: Cardiovascular;  Laterality: N/A;  . PACEMAKER INSERTION  06/2010  . PERCUTANEOUS CORONARY STENT INTERVENTION (PCI-S) N/A 02/13/2012   Procedure: PERCUTANEOUS CORONARY STENT INTERVENTION (PCI-S);  Surgeon: Robynn Pane, MD;  Location: [facility] CATH LAB;  Service: Cardiovascular;  Laterality: N/A;  . svd     x 2  . THYROIDECTOMY      Current Medications: Outpatient Medications Prior to Visit  Medication Sig Dispense Refill  . acetaminophen (TYLENOL) 500 MG tablet Take 500 mg by mouth every 4 (four) hours as needed for mild pain (pain).     Marland Kitchen amLODipine (NORVASC) 5 MG tablet Take 5 mg by mouth daily.    . carvedilol (COREG) 12.5 MG tablet TAKE 1 TABLET BY MOUTH TWICE DAILY WITH A MEAL. APPOINTMENT NEEDED FOR FURTHER REFILLS. 1ST ATTEMPT 180 tablet 0  . conjugated estrogens (PREMARIN) vaginal cream Place 1 Applicatorful vaginally 3 (three) times a week. Use at bedtime on Monday, Wednesday, Friday    . Dorzolamide HCl-Timolol Mal PF (COSOPT PF) 22.3-6.8 MG/ML SOLN Place 1 drop into both eyes 2 (two) times daily.    . famotidine (PEPCID) 20 MG tablet Take 20 mg by mouth 2 (two) times daily as needed for heartburn.     . furosemide (LASIX) 20 MG tablet Take 40 mg by mouth daily.     Marland Kitchen gabapentin (NEURONTIN) 100 MG capsule Take 1 capsule (100 mg total) by mouth 3 (three) times daily as needed (pain). 90 capsule 0  . HYDROcodone-acetaminophen (NORCO/VICODIN) 5-325 MG tablet Take 0.5 tablets by mouth every 6 (six) hours as needed for moderate pain.    . hydrocortisone (PROCTOZONE-HC) 2.5 % rectal cream Place 1 application rectally 2 (two) times daily as needed for hemorrhoids or itching.    . lidocaine (XYLOCAINE) 5 % ointment Apply 1 application topically as needed for mild pain (for rectal pain).    . nitroGLYCERIN (NITRODUR - DOSED IN MG/24  HR) 0.4 mg/hr patch Place 0.4 mg onto the skin every morning. REMOVE EVERY NIGHT    . ramipril (ALTACE) 10 MG capsule Take 10 mg by mouth daily.     . Tafluprost (ZIOPTAN) 0.0015 % SOLN Place 1 drop into both eyes at bedtime.     Marland Kitchen nystatin (MYCOSTATIN) 100000 UNIT/ML suspension Take 5 mLs (500,000 Units total) by mouth 4 (four) times daily. 60 mL 0  . sucralfate (CARAFATE) 1 g tablet Take 1 tablet (1 g total) by mouth 4 (four) times daily as needed. 30 tablet 0   No facility-administered medications prior to visit.     Allergies:   Influenza vac split [flu virus vaccine], Tramadol, and Shellfish allergy   Social History   Socioeconomic History  . Marital  status: Widowed    Spouse name: Not on file  . Number of children: 2  . Years of education: Not on file  . Highest education level: Not on file  Occupational History  . Occupation: retired  Tobacco Use  . Smoking status: Former Smoker    Packs/day: 0.50    Types: Cigarettes    Quit date: 09/05/1991    Years since quitting: 29.0  . Smokeless tobacco: Never Used  Substance and Sexual Activity  . Alcohol use: No    Comment: none since 1992  . Drug use: No  . Sexual activity: Not Currently  Other Topics Concern  . Not on file  Social History Narrative  . Not on file   Social Determinants of Health   Financial Resource Strain:   . Difficulty of Paying Living Expenses: Not on file  Food Insecurity:   . Worried About Programme researcher, broadcasting/film/videounning Out of Food in the Last Year: Not on file  . Ran Out of Food in the Last Year: Not on file  Transportation Needs:   . Lack of Transportation (Medical): Not on file  . Lack of Transportation (Non-Medical): Not on file  Physical Activity:   . Days of Exercise per Week: Not on file  . Minutes of Exercise per Session: Not on file  Stress:   . Feeling of Stress : Not on file  Social Connections:   . Frequency of Communication with Friends and Family: Not on file  . Frequency of Social Gatherings with  Friends and Family: Not on file  . Attends Religious Services: Not on file  . Active Member of Clubs or Organizations: Not on file  . Attends BankerClub or Organization Meetings: Not on file  . Marital Status: Not on file     Family History:  The patient's family history includes Cancer in her sister; Diabetes in her mother.   ROS:   Please see the history of present illness.    ROS All other systems are reviewed and are negative.   PHYSICAL EXAM:   VS:  BP 140/80   Pulse 60   Wt 98 lb (44.5 kg)   SpO2 100%   BMI 17.92 kg/m      General: Alert, oriented x3, no distress, she appears elderly, very frail, underweight.  Healthy left subclavian pacemaker site.  Prominent kyphosis. Head: no evidence of trauma, PERRL, EOMI, no exophtalmos or lid lag, no myxedema, no xanthelasma; normal ears, nose and oropharynx Neck: normal jugular venous pulsations and no hepatojugular reflux; brisk carotid pulses without delay and no carotid bruits Chest: clear to auscultation, no signs of consolidation by percussion or palpation, normal fremitus, symmetrical and full respiratory excursions Cardiovascular: normal position and quality of the apical impulse, regular rhythm, normal first and second heart sounds, no murmurs, rubs or gallops Abdomen: no tenderness or distention, no masses by palpation, no abnormal pulsatility or arterial bruits, normal bowel sounds, no hepatosplenomegaly Extremities: no clubbing, cyanosis or edema; 2+ radial, ulnar and brachial pulses bilaterally; 2+ right femoral, posterior tibial and dorsalis pedis pulses; 2+ left femoral, posterior tibial and dorsalis pedis pulses; no subclavian or femoral bruits Neurological: grossly nonfocal except blind and very hard of hearing Psych: Normal mood and affect   Wt Readings from Last 3 Encounters:  09/20/20 98 lb (44.5 kg)  09/15/19 96 lb 12.8 oz (43.9 kg)  09/12/18 102 lb 9.6 oz (46.5 kg)      Studies/Labs Reviewed:   EKG:  EKG is not  ordered today.  The intracardiac electrogram shows atrial sensed, ventricular paced asynchronous rhythm.  Device reprogrammed DDDR with atrial sensed, ventricular paced synchronous rhythm.  Recent Labs: 11/03/2019: ALT 11; BUN 13; Creatinine, Ser 1.00; Hemoglobin 12.9; Platelets 174; Potassium 3.9; Sodium 140   Lipid Panel    Component Value Date/Time   CHOL 211 (H) 03/24/2016 0627   TRIG 60 03/24/2016 0627   HDL 74 03/24/2016 0627   CHOLHDL 2.9 03/24/2016 0627   VLDL 12 03/24/2016 0627   LDLCALC 125 (H) 03/24/2016 1610     ASSESSMENT:    1. Persistent atrial fibrillation (HCC)   2. SSS (sick sinus syndrome) (HCC)   3. Second degree AV block   4. Pacemaker   5. Coronary artery disease of native artery of native heart with stable angina pectoris (HCC)   6. Essential hypertension      PLAN:  In order of problems listed above:  1. AFib: This is not a permanent arrhythmia.  Pacemaker reprogrammed to provide AV synchrony today.  Not on anticoagulation in view of advanced age and poor functional status. 2. SSS/2nd deg AV block: She has clear evidence of conduction system disease with severe sinus bradycardia and intermittent second-degree AV block, very slow conduction during atrial fibrillation.  However, she is not pacemaker dependent. 3. PPM: Device reprogrammed DDDR today.  Anticipate need for generator change in roughly 6 months, when we will see her back in clinic.  At this point, generator change out still appears to be at the appropriate intervention, but if her quality of life continues to deteriorate we will have to discuss whether or not a more palliative care approach is appropriate. 4. CAD: Asymptomatic. 5. HTN: Blood pressure is actually on the high side today.  No changes made to her medications.    Medication Adjustments/Labs and Tests Ordered: Current medicines are reviewed at length with the patient today.  Concerns regarding medicines are outlined above.  Medication  changes, Labs and Tests ordered today are listed in the Patient Instructions below. Patient Instructions  Medication Instructions:  No changes *If you need a refill on your cardiac medications before your next appointment, please call your pharmacy*   Lab Work: None ordered If you have labs (blood work) drawn today and your tests are completely normal, you will receive your results only by: Marland Kitchen MyChart Message (if you have MyChart) OR . A paper copy in the mail If you have any lab test that is abnormal or we need to change your treatment, we will call you to review the results.   Testing/Procedures: None ordered   Follow-Up: At [facility], you and your health needs are our priority.  As part of our continuing mission to provide you with exceptional heart care, we have created designated Provider Care Teams.  These Care Teams include your primary Cardiologist (physician) and Advanced Practice Providers (APPs -  Physician Assistants and Nurse Practitioners) who all work together to provide you with the care you need, when you need it.  We recommend signing up for the patient portal called "MyChart".  Sign up information is provided on this After Visit Summary.  MyChart is used to connect with patients for Virtual Visits (Telemedicine).  Patients are able to view lab/test results, encounter notes, upcoming appointments, etc.  Non-urgent messages can be sent to your provider as well.   To learn more about what you can do with MyChart, go to [email or web address].    Your next appointment:   6 month(s)  The format for  your next appointment:   In Person  Provider:   Thurmon Fair, MD      Signed, Thurmon Fair, MD  09/20/2020 2:24 PM    [facility] [address] [place], [place], [place]  [address] Phone: [phone]; Fax: [phone]

## 2020-09-20 NOTE — Patient Instructions (Signed)

## 2020-09-30 DIAGNOSIS — K219 Gastro-esophageal reflux disease without esophagitis: Secondary | ICD-10-CM | POA: Diagnosis not present

## 2020-09-30 DIAGNOSIS — I2510 Atherosclerotic heart disease of native coronary artery without angina pectoris: Secondary | ICD-10-CM | POA: Diagnosis not present

## 2020-09-30 DIAGNOSIS — I10 Essential (primary) hypertension: Secondary | ICD-10-CM | POA: Diagnosis not present

## 2020-09-30 DIAGNOSIS — R102 Pelvic and perineal pain: Secondary | ICD-10-CM | POA: Diagnosis not present

## 2020-09-30 DIAGNOSIS — R682 Dry mouth, unspecified: Secondary | ICD-10-CM | POA: Diagnosis not present

## 2020-09-30 DIAGNOSIS — M150 Primary generalized (osteo)arthritis: Secondary | ICD-10-CM | POA: Diagnosis not present

## 2020-10-07 DIAGNOSIS — E785 Hyperlipidemia, unspecified: Secondary | ICD-10-CM | POA: Diagnosis not present

## 2020-10-07 DIAGNOSIS — I5020 Unspecified systolic (congestive) heart failure: Secondary | ICD-10-CM | POA: Diagnosis not present

## 2020-10-07 DIAGNOSIS — I495 Sick sinus syndrome: Secondary | ICD-10-CM | POA: Diagnosis not present

## 2020-10-07 DIAGNOSIS — I10 Essential (primary) hypertension: Secondary | ICD-10-CM | POA: Diagnosis not present

## 2020-10-07 DIAGNOSIS — I25118 Atherosclerotic heart disease of native coronary artery with other forms of angina pectoris: Secondary | ICD-10-CM | POA: Diagnosis not present

## 2020-10-13 ENCOUNTER — Ambulatory Visit (INDEPENDENT_AMBULATORY_CARE_PROVIDER_SITE_OTHER): Payer: Medicare PPO

## 2020-10-13 DIAGNOSIS — I495 Sick sinus syndrome: Secondary | ICD-10-CM | POA: Diagnosis not present

## 2020-10-13 LAB — CUP PACEART REMOTE DEVICE CHECK
Battery Remaining Longevity: 1 mo
Battery Remaining Percentage: 1 %
Battery Voltage: 2.63 V
Brady Statistic AP VP Percent: 95 %
Brady Statistic AP VS Percent: 1 %
Brady Statistic AS VP Percent: 2.9 %
Brady Statistic AS VS Percent: 1 %
Brady Statistic RA Percent Paced: 16 %
Brady Statistic RV Percent Paced: 98 %
Date Time Interrogation Session: 20211215032407
Implantable Lead Implant Date: 20110923
Implantable Lead Implant Date: 20110923
Implantable Lead Location: 753859
Implantable Lead Location: 753860
Implantable Pulse Generator Implant Date: 20110923
Lead Channel Impedance Value: 410 Ohm
Lead Channel Impedance Value: 460 Ohm
Lead Channel Pacing Threshold Amplitude: 0.5 V
Lead Channel Pacing Threshold Amplitude: 0.875 V
Lead Channel Pacing Threshold Pulse Width: 0.4 ms
Lead Channel Pacing Threshold Pulse Width: 0.4 ms
Lead Channel Sensing Intrinsic Amplitude: 1.2 mV
Lead Channel Sensing Intrinsic Amplitude: 12 mV
Lead Channel Setting Pacing Amplitude: 1.125
Lead Channel Setting Pacing Amplitude: 2 V
Lead Channel Setting Pacing Pulse Width: 0.4 ms
Lead Channel Setting Sensing Sensitivity: 2 mV
Pulse Gen Model: 2210
Pulse Gen Serial Number: 2561895

## 2020-10-21 ENCOUNTER — Other Ambulatory Visit: Payer: Self-pay | Admitting: Cardiovascular Disease

## 2020-10-21 ENCOUNTER — Other Ambulatory Visit: Payer: Self-pay

## 2020-10-21 MED ORDER — CARVEDILOL 12.5 MG PO TABS: ORAL_TABLET | ORAL | 3 refills | Status: DC

## 2020-10-27 ENCOUNTER — Telehealth: Payer: Self-pay

## 2020-10-27 DIAGNOSIS — I700 Atherosclerosis of aorta: Secondary | ICD-10-CM | POA: Diagnosis not present

## 2020-10-27 DIAGNOSIS — T300 Burn of unspecified body region, unspecified degree: Secondary | ICD-10-CM | POA: Diagnosis not present

## 2020-10-27 NOTE — Telephone Encounter (Signed)
Spoke with pt grandaughter, [name] (DPR on file).  Advised device <64mnths until ERI, we will update monitoring to monthly.  Next scheduled check in [place] is 11/15/20.

## 2020-10-27 NOTE — Progress Notes (Signed)
Remote pacemaker transmission.   

## 2020-11-15 ENCOUNTER — Ambulatory Visit (INDEPENDENT_AMBULATORY_CARE_PROVIDER_SITE_OTHER): Payer: Medicare PPO

## 2020-11-15 DIAGNOSIS — I495 Sick sinus syndrome: Secondary | ICD-10-CM

## 2020-11-18 DIAGNOSIS — I739 Peripheral vascular disease, unspecified: Secondary | ICD-10-CM | POA: Diagnosis not present

## 2020-11-18 DIAGNOSIS — L602 Onychogryphosis: Secondary | ICD-10-CM | POA: Diagnosis not present

## 2020-11-30 NOTE — Progress Notes (Signed)
Remote pacemaker transmission.   

## 2020-12-03 DIAGNOSIS — R300 Dysuria: Secondary | ICD-10-CM | POA: Diagnosis not present

## 2020-12-16 ENCOUNTER — Ambulatory Visit (INDEPENDENT_AMBULATORY_CARE_PROVIDER_SITE_OTHER): Payer: Medicare PPO

## 2020-12-16 DIAGNOSIS — I495 Sick sinus syndrome: Secondary | ICD-10-CM

## 2020-12-16 LAB — CUP PACEART REMOTE DEVICE CHECK
Battery Remaining Longevity: 1 mo
Battery Remaining Percentage: 0.5 %
Battery Voltage: 2.6 V
Brady Statistic AP VP Percent: 95 %
Brady Statistic AP VS Percent: 1 %
Brady Statistic AS VP Percent: 3 %
Brady Statistic AS VS Percent: 1 %
Brady Statistic RA Percent Paced: 4.2 %
Brady Statistic RV Percent Paced: 99 %
Date Time Interrogation Session: 20220217034455
Implantable Lead Implant Date: 20110923
Implantable Lead Implant Date: 20110923
Implantable Lead Location: 753859
Implantable Lead Location: 753860
Implantable Pulse Generator Implant Date: 20110923
Lead Channel Impedance Value: 410 Ohm
Lead Channel Impedance Value: 480 Ohm
Lead Channel Pacing Threshold Amplitude: 0.5 V
Lead Channel Pacing Threshold Amplitude: 0.875 V
Lead Channel Pacing Threshold Pulse Width: 0.4 ms
Lead Channel Pacing Threshold Pulse Width: 0.4 ms
Lead Channel Sensing Intrinsic Amplitude: 1.5 mV
Lead Channel Sensing Intrinsic Amplitude: 12 mV
Lead Channel Setting Pacing Amplitude: 1.125
Lead Channel Setting Pacing Amplitude: 2 V
Lead Channel Setting Pacing Pulse Width: 0.4 ms
Lead Channel Setting Sensing Sensitivity: 2 mV
Pulse Gen Model: 2210
Pulse Gen Serial Number: 2561895

## 2020-12-22 NOTE — Progress Notes (Signed)
Remote pacemaker transmission.   

## 2020-12-23 DIAGNOSIS — H401133 Primary open-angle glaucoma, bilateral, severe stage: Secondary | ICD-10-CM | POA: Diagnosis not present

## 2020-12-23 DIAGNOSIS — H548 Legal blindness, as defined in USA: Secondary | ICD-10-CM | POA: Diagnosis not present

## 2021-01-06 DIAGNOSIS — I739 Peripheral vascular disease, unspecified: Secondary | ICD-10-CM | POA: Diagnosis not present

## 2021-01-06 DIAGNOSIS — I495 Sick sinus syndrome: Secondary | ICD-10-CM | POA: Diagnosis not present

## 2021-01-06 DIAGNOSIS — I25118 Atherosclerotic heart disease of native coronary artery with other forms of angina pectoris: Secondary | ICD-10-CM | POA: Diagnosis not present

## 2021-01-06 DIAGNOSIS — E785 Hyperlipidemia, unspecified: Secondary | ICD-10-CM | POA: Diagnosis not present

## 2021-01-06 DIAGNOSIS — I70203 Unspecified atherosclerosis of native arteries of extremities, bilateral legs: Secondary | ICD-10-CM | POA: Diagnosis not present

## 2021-01-06 DIAGNOSIS — I10 Essential (primary) hypertension: Secondary | ICD-10-CM | POA: Diagnosis not present

## 2021-01-06 DIAGNOSIS — I5020 Unspecified systolic (congestive) heart failure: Secondary | ICD-10-CM | POA: Diagnosis not present

## 2021-01-12 ENCOUNTER — Ambulatory Visit (INDEPENDENT_AMBULATORY_CARE_PROVIDER_SITE_OTHER): Payer: Medicare PPO | Admitting: Cardiovascular Disease

## 2021-01-12 ENCOUNTER — Other Ambulatory Visit: Payer: Self-pay

## 2021-01-12 ENCOUNTER — Encounter: Payer: Self-pay | Admitting: Cardiovascular Disease

## 2021-01-12 ENCOUNTER — Ambulatory Visit (INDEPENDENT_AMBULATORY_CARE_PROVIDER_SITE_OTHER): Payer: Medicare PPO

## 2021-01-12 VITALS — BP 128/70 | HR 70 | Ht 59.0 in | Wt 101.2 lb

## 2021-01-12 DIAGNOSIS — I495 Sick sinus syndrome: Secondary | ICD-10-CM

## 2021-01-12 DIAGNOSIS — I10 Essential (primary) hypertension: Secondary | ICD-10-CM | POA: Diagnosis not present

## 2021-01-12 DIAGNOSIS — I25118 Atherosclerotic heart disease of native coronary artery with other forms of angina pectoris: Secondary | ICD-10-CM

## 2021-01-12 DIAGNOSIS — Z950 Presence of cardiac pacemaker: Secondary | ICD-10-CM

## 2021-01-12 DIAGNOSIS — I441 Atrioventricular block, second degree: Secondary | ICD-10-CM | POA: Diagnosis not present

## 2021-01-12 DIAGNOSIS — I4819 Other persistent atrial fibrillation: Secondary | ICD-10-CM | POA: Diagnosis not present

## 2021-01-12 LAB — CUP PACEART REMOTE DEVICE CHECK
Battery Remaining Longevity: 1 mo
Battery Remaining Percentage: 0.5 %
Battery Voltage: 2.59 V
Brady Statistic AP VP Percent: 95 %
Brady Statistic AP VS Percent: 1 %
Brady Statistic AS VP Percent: 3 %
Brady Statistic AS VS Percent: 1 %
Brady Statistic RA Percent Paced: 3.2 %
Brady Statistic RV Percent Paced: 99 %
Date Time Interrogation Session: 20220316045412
Implantable Lead Implant Date: 20110923
Implantable Lead Implant Date: 20110923
Implantable Lead Location: 753859
Implantable Lead Location: 753860
Implantable Pulse Generator Implant Date: 20110923
Lead Channel Impedance Value: 380 Ohm
Lead Channel Impedance Value: 460 Ohm
Lead Channel Pacing Threshold Amplitude: 0.5 V
Lead Channel Pacing Threshold Amplitude: 0.75 V
Lead Channel Pacing Threshold Pulse Width: 0.4 ms
Lead Channel Pacing Threshold Pulse Width: 0.4 ms
Lead Channel Sensing Intrinsic Amplitude: 1.5 mV
Lead Channel Sensing Intrinsic Amplitude: 12 mV
Lead Channel Setting Pacing Amplitude: 1 V
Lead Channel Setting Pacing Amplitude: 2 V
Lead Channel Setting Pacing Pulse Width: 0.4 ms
Lead Channel Setting Sensing Sensitivity: 2 mV
Pulse Gen Model: 2210
Pulse Gen Serial Number: 2561895

## 2021-01-12 MED ORDER — CARVEDILOL 6.25 MG PO TABS: 6.2500 mg | ORAL_TABLET | Freq: Two times a day (BID) | ORAL | 3 refills | Status: AC

## 2021-01-12 NOTE — Patient Instructions (Signed)
Medication Instructions:  DECREASE the Carvedilol to 6.25 mg twice daily  *If you need a refill on your cardiac medications before your next appointment, please call your pharmacy*   Lab Work: None ordered If you have labs (blood work) drawn today and your tests are completely normal, you will receive your results only by: Marland Kitchen MyChart Message (if you have MyChart) OR . A paper copy in the mail If you have any lab test that is abnormal or we need to change your treatment, we will call you to review the results.   Testing/Procedures: None ordered   Follow-Up: At [facility], you and your health needs are our priority.  As part of our continuing mission to provide you with exceptional heart care, we have created designated Provider Care Teams.  These Care Teams include your primary Cardiologist (physician) and Advanced Practice Providers (APPs -  Physician Assistants and Nurse Practitioners) who all work together to provide you with the care you need, when you need it.  We recommend signing up for the patient portal called "MyChart".  Sign up information is provided on this After Visit Summary.  MyChart is used to connect with patients for Virtual Visits (Telemedicine).  Patients are able to view lab/test results, encounter notes, upcoming appointments, etc.  Non-urgent messages can be sent to your provider as well.   To learn more about what you can do with MyChart, go to [email or web address].    Your next appointment:   Follow up as needed with Dr. Royann Shivers

## 2021-01-12 NOTE — Progress Notes (Signed)
Cardiology Office Note    Date:  01/12/2021   ID:  [name], DOB 25-Dec-1917, MRN [id number]  PCP:  Asencion Gowda.August Saucer, MD  Cardiologist:  Rinaldo Cloud, MD; Thurmon Fair, MD   Chief Complaint  Patient presents with  . Pacemaker Problem    History of Present Illness:  [name] is a 87 y.o. female with sinus node dysfunction and AV conduction disease presenting for pacemaker follow-up. She also has paroxysmal atrial fibrillation, coronary artery disease s/p her septal infarction and LAD stent in 2011, stent to left circumflex in 2013, hyperlipidemia and hypertension for which she is followed by Dr. Sharyn [name].  As always she is accompanied by her granddaughter, [name].  Flud is now completely blind and is also very hard of hearing. She is extremely sedentary, but otherwise is doing fairly well.  Her most frequent complaint is low back pain, for which she occasionally takes acetaminophen.  She denies shortness of breath, but has had worsening lower extremity edema.  She never has complaints of chest discomfort.  She sleeps a lot.  Recently saw Dr. Sharyn [name] in clinic who gave her diuretics.  Her urine output has increased but she still has considerable ankle edema.  However, this resolves after lying in bed overnight, comes back when she sits in a chair, which is her preferred position during the day.  [name] and the rest of the family have debated whether or not to go ahead with pacemaker generator change out and have decided against it.  They do not think that [facility] will benefit from this since her overall clinical condition has deteriorated so much.  Her rhythm today is atrial fibrillation/atypical atrial flutter with 100% ventricular pacing at 70 bpm.  At her previous appointment the underlying rhythm was sinus bradycardia with 1: 1 AV conduction at about 44 bpm, but she was unable to conduct normally at faster heart rates.  Her pacemaker check from just yesterday shows 94% atrial  fibrillation and 98% ventricular pacing.  She is taking a relatively high dose of carvedilol.  Estimated generator longevity is less than 3 months.   Past Medical History:  Diagnosis Date  . Anemia   . Angina    on nitro-dur  . Coronary artery disease   . Elevated troponin   . GERD (gastroesophageal reflux disease)   . Glaucoma   . H/O myocardial perfusion scan 07/13/2011   The post stress left ventricle is normal in size. the rest left ventricle is normal in size. there is no scintigraphic evidence of inducible myocardial ishemia. The post-stress left ventricular function is normal. the post-stress ejection fraction is 66%.This is a low risk scan.  Marland Kitchen History of eye surgery    bilateral   . HOH (hard of hearing)   . Hyperlipidemia   . Hypertension   . Myocardial infarction (HCC)    x 3  . Pacemaker   . Rotator cuff tear    chronic  . Shortness of breath    with exertion  . SSS (sick sinus syndrome) (HCC) 07/25/2016    Past Surgical History:  Procedure Laterality Date  . APPENDECTOMY    . cardiac catherization  06/2010  . CARDIAC CATHETERIZATION    . CARDIAC CATHETERIZATION Left 07/19/2010   LV showed good LV systolic function, LVH EF of 60 % to 65 %. Left main was patent. LAD has 20 % to 30 % proximal stenosis and 99 % mid stenosis with TIMI 2 distal flow. Diagonal-1 was very, very  small. Diagonal-2 was very small, Diagonal-3 was small which has 30 % to 40 % ostial stenosis just before the bifurcation with LAD stenosis. Left circumflex has 40 % to 50 % mid stenosis and 70 % to 75 % .  Marland Kitchen CORONARY ANGIOPLASTY WITH STENT PLACEMENT  02/13/2012  . HYSTEROSCOPY WITH D & C  09/07/2011   Procedure: DILATATION AND CURETTAGE (D&C) /HYSTEROSCOPY;  Surgeon: Amy Daniels, MD;  Location: [facility];  Service: Gynecology;  Laterality: N/A;  uterus  . INSERT / REPLACE / REMOVE PACEMAKER    . LEFT HEART CATHETERIZATION WITH CORONARY ANGIOGRAM N/A 01/25/2012   Procedure: LEFT HEART CATHETERIZATION WITH  CORONARY ANGIOGRAM;  Surgeon: Amy Pane, MD;  Location: [facility] CATH LAB;  Service: Cardiovascular;  Laterality: N/A;  . LEFT HEART CATHETERIZATION WITH CORONARY ANGIOGRAM N/A 12/18/2014   Procedure: LEFT HEART CATHETERIZATION WITH CORONARY ANGIOGRAM;  Surgeon: Amy Pane, MD;  Location: [facility] CATH LAB;  Service: Cardiovascular;  Laterality: N/A;  . PACEMAKER INSERTION  06/2010  . PERCUTANEOUS CORONARY STENT INTERVENTION (PCI-S) N/A 02/13/2012   Procedure: PERCUTANEOUS CORONARY STENT INTERVENTION (PCI-S);  Surgeon: Amy Pane, MD;  Location: [facility] CATH LAB;  Service: Cardiovascular;  Laterality: N/A;  . svd     x 2  . THYROIDECTOMY      Current Medications: Outpatient Medications Prior to Visit  Medication Sig Dispense Refill  . acetaminophen (TYLENOL) 500 MG tablet Take 500 mg by mouth every 4 (four) hours as needed for mild pain (pain).     . conjugated estrogens (PREMARIN) vaginal cream Place 1 Applicatorful vaginally 3 (three) times a week. Use at bedtime on Monday, Wednesday, Friday    . dorzolamidel-timolol (COSOPT) 22.3-6.8 MG/ML SOLN ophthalmic solution Place 1 drop into both eyes 2 (two) times daily.    Marland Kitchen gabapentin (NEURONTIN) 100 MG capsule Take 1 capsule (100 mg total) by mouth 3 (three) times daily as needed (pain). 90 capsule 0  . HYDROcodone-acetaminophen (NORCO/VICODIN) 5-325 MG tablet Take 0.5 tablets by mouth every 6 (six) hours as needed for moderate pain.    . hydrocortisone (ANUSOL-HC) 2.5 % rectal cream Place 1 application rectally 2 (two) times daily as needed for hemorrhoids or itching.    . lidocaine (XYLOCAINE) 5 % ointment Apply 1 application topically as needed for mild pain (for rectal pain).    . nitroGLYCERIN (NITRODUR - DOSED IN MG/24 HR) 0.4 mg/hr patch Place 0.4 mg onto the skin every morning. REMOVE EVERY NIGHT    . omeprazole (PRILOSEC) 20 MG capsule Take 20 mg by mouth in the morning. 30 minutes before breakfast    . ondansetron (ZOFRAN) 8 MG tablet 1  tablet    . Psyllium 30.9 % POWD See admin instructions.    . ramipril (ALTACE) 10 MG capsule Take 10 mg by mouth daily.    . Tafluprost 0.0015 % SOLN Place 1 drop into both eyes at bedtime.     . torsemide (DEMADEX) 20 MG tablet Take 20 mg by mouth daily.    . carvedilol (COREG) 12.5 MG tablet TAKE 1 TABLET BY MOUTH TWICE DAILY WITH A MEAL. APPT NEEDED FOR REFILLS 180 tablet 3  . famotidine (PEPCID) 20 MG tablet Take 20 mg by mouth 2 (two) times daily as needed for heartburn.    Marland Kitchen amLODipine (NORVASC) 5 MG tablet Take 5 mg by mouth daily.    . furosemide (LASIX) 20 MG tablet Take 40 mg by mouth daily.      No facility-administered medications prior  to visit.     Allergies:   Influenza vac split [influenza virus vaccine], Tramadol, and Shellfish allergy   Social History   Socioeconomic History  . Marital status: Widowed    Spouse name: Not on file  . Number of children: 2  . Years of education: Not on file  . Highest education level: Not on file  Occupational History  . Occupation: retired  Tobacco Use  . Smoking status: Former Smoker    Packs/day: 0.50    Types: Cigarettes    Quit date: 09/05/1991    Years since quitting: 29.3  . Smokeless tobacco: Never Used  Substance and Sexual Activity  . Alcohol use: No    Comment: none since 1992  . Drug use: No  . Sexual activity: Not Currently  Other Topics Concern  . Not on file  Social History Narrative  . Not on file   Social Determinants of Health   Financial Resource Strain: Not on file  Food Insecurity: Not on file  Transportation Needs: Not on file  Physical Activity: Not on file  Stress: Not on file  Social Connections: Not on file     Family History:  The patient's family history includes Cancer in her sister; Diabetes in her mother.   ROS:   Please see the history of present illness.    ROS All other systems are reviewed and are negative.   PHYSICAL EXAM:   VS:  BP 128/70 (BP Location: Left Arm, Patient  Position: Sitting)   Pulse 70   Ht 4\' 11"  (1.499 m)   Wt 101 lb 3.2 oz (45.9 kg)   SpO2 94%   BMI 20.44 kg/m       General: no distress, very frail and underweight.  Slept during most of our interaction, but can be awoken and does understand questions when spoken to in a very loud voice.  Sitting in a wheelchair slumped over at 90 degrees. Head: no evidence of trauma, PERRL, EOMI, no exophtalmos or lid lag, no myxedema, no xanthelasma; normal ears, nose and oropharynx Neck: normal jugular venous pulsations and no hepatojugular reflux; brisk carotid pulses without delay and no carotid bruits Chest: clear to auscultation, no signs of consolidation by percussion or palpation, normal fremitus, symmetrical and full respiratory excursions.  Healthy left subclavian pacemaker site. Cardiovascular: normal position and quality of the apical impulse, regular rhythm, normal first and second heart sounds, no murmurs, rubs or gallops Abdomen: no tenderness or distention, no masses by palpation, no abnormal pulsatility or arterial bruits, normal bowel sounds, no hepatosplenomegaly Extremities: no clubbing, cyanosis; 2+ symmetrical pretibial soft pitting edema; 2+ radial, ulnar and brachial pulses bilaterally; 2+ right femoral, posterior tibial and dorsalis pedis pulses; 2+ left femoral, posterior tibial and dorsalis pedis pulses; no subclavian or femoral bruits Neurological: grossly nonfocal Psych: Normal mood and affect    Wt Readings from Last 3 Encounters:  01/12/21 101 lb 3.2 oz (45.9 kg)  09/20/20 98 lb (44.5 kg)  09/15/19 96 lb 12.8 oz (43.9 kg)      Studies/Labs Reviewed:   EKG:  EKG is not ordered today.   Recent Labs: No results found for requested labs within last 8760 hours.   Lipid Panel    Component Value Date/Time   CHOL 211 (H) 03/24/2016 0627   TRIG 60 03/24/2016 0627   HDL 74 03/24/2016 0627   CHOLHDL 2.9 03/24/2016 0627   VLDL 12 03/24/2016 0627   LDLCALC 125 (H)  03/24/2016 [id number]  ASSESSMENT:    1. Persistent atrial fibrillation (HCC)   2. SSS (sick sinus syndrome) (HCC)   3. Second degree AV block   4. Pacemaker   5. Coronary artery disease of native artery of native heart with stable angina pectoris (HCC)   6. Essential hypertension      PLAN:  In order of problems listed above:  1. AFib: Surprisingly, she has recently had periods of sinus rhythm, but this has mostly been a longstanding persistent arrhythmia.  Pacemaker reprogrammed to provide AV synchrony at her last office appointment, but she is back in atrial fibrillation today.  Not on anticoagulation in view of advanced age and poor functional status. 2. SSS/2nd deg AV block: She has clear evidence of conduction system disease with severe sinus bradycardia and intermittent second-degree AV block, very slow conduction during atrial fibrillation.  However, she is not pacemaker dependent. 3. PPM: Her quality of life has deteriorated substantially over the last couple of years.  She finds it very hard to communications she is blind and almost completely deaf and does not want to wear hearing aids.  Sleeps most of the day.  She is underweight and appears very feeble and frail.  Her pacemaker is likely to reach end of service in the next 3-6 months.  The family has talked it over and I do not think it is in Otto's best interest to proceed with a pacemaker generator change.  Described the typical behavior of a pacemaker as the battery reaches and exceeds end of service.  To try to reduce the need for pacing, will reduce the dose of carvedilol to 6.25 mg twice daily and the dose can be further gradually weaned in the future. 4. CAD: Asymptomatic.  Hopefully she will not develop angina has reduce the dose of carvedilol. 5. HTN: Blood pressure is in normal range, may increase with reducing carvedilol, but I did not make any other changes to her medications.    Medication Adjustments/Labs and Tests  Ordered: Current medicines are reviewed at length with the patient today.  Concerns regarding medicines are outlined above.  Medication changes, Labs and Tests ordered today are listed in the Patient Instructions below. Patient Instructions  Medication Instructions:  DECREASE the Carvedilol to 6.25 mg twice daily  *If you need a refill on your cardiac medications before your next appointment, please call your pharmacy*   Lab Work: None ordered If you have labs (blood work) drawn today and your tests are completely normal, you will receive your results only by: Marland Kitchen MyChart Message (if you have MyChart) OR . A paper copy in the mail If you have any lab test that is abnormal or we need to change your treatment, we will call you to review the results.   Testing/Procedures: None ordered   Follow-Up: At [facility], you and your health needs are our priority.  As part of our continuing mission to provide you with exceptional heart care, we have created designated Provider Care Teams.  These Care Teams include your primary Cardiologist (physician) and Advanced Practice Providers (APPs -  Physician Assistants and Nurse Practitioners) who all work together to provide you with the care you need, when you need it.  We recommend signing up for the patient portal called "MyChart".  Sign up information is provided on this After Visit Summary.  MyChart is used to connect with patients for Virtual Visits (Telemedicine).  Patients are able to view lab/test results, encounter notes, upcoming appointments, etc.  Non-urgent messages  can be sent to your provider as well.   To learn more about what you can do with MyChart, go to [email or web address].    Your next appointment:   Follow up as needed with Dr. Royann Shivers     Signed, Thurmon Fair, MD  01/12/2021 4:35 PM    [facility] [address] [place], [place], [place]  [address] Phone: [phone]; Fax: [phone]

## 2021-01-20 NOTE — Progress Notes (Signed)
Remote pacemaker transmission.   

## 2021-01-24 ENCOUNTER — Emergency Department (HOSPITAL_COMMUNITY): Payer: Medicare PPO

## 2021-01-24 ENCOUNTER — Other Ambulatory Visit: Payer: Self-pay

## 2021-01-24 ENCOUNTER — Emergency Department (HOSPITAL_COMMUNITY)
Admission: EM | Admit: 2021-01-24 | Discharge: 2021-01-24 | Disposition: A | Discharge status: Home / Self Care | Payer: Medicare PPO | Attending: Emergency Medicine | Admitting: Emergency Medicine

## 2021-01-24 DIAGNOSIS — M549 Dorsalgia, unspecified: Secondary | ICD-10-CM | POA: Diagnosis not present

## 2021-01-24 DIAGNOSIS — Z87891 Personal history of nicotine dependence: Secondary | ICD-10-CM | POA: Diagnosis not present

## 2021-01-24 DIAGNOSIS — Z951 Presence of aortocoronary bypass graft: Secondary | ICD-10-CM | POA: Insufficient documentation

## 2021-01-24 DIAGNOSIS — I2510 Atherosclerotic heart disease of native coronary artery without angina pectoris: Secondary | ICD-10-CM | POA: Diagnosis not present

## 2021-01-24 DIAGNOSIS — M545 Low back pain, unspecified: Secondary | ICD-10-CM | POA: Insufficient documentation

## 2021-01-24 DIAGNOSIS — R600 Localized edema: Secondary | ICD-10-CM | POA: Diagnosis not present

## 2021-01-24 DIAGNOSIS — Z950 Presence of cardiac pacemaker: Secondary | ICD-10-CM | POA: Insufficient documentation

## 2021-01-24 DIAGNOSIS — J90 Pleural effusion, not elsewhere classified: Secondary | ICD-10-CM | POA: Diagnosis not present

## 2021-01-24 DIAGNOSIS — Z79899 Other long term (current) drug therapy: Secondary | ICD-10-CM | POA: Insufficient documentation

## 2021-01-24 DIAGNOSIS — R519 Headache, unspecified: Secondary | ICD-10-CM | POA: Insufficient documentation

## 2021-01-24 DIAGNOSIS — G4489 Other headache syndrome: Secondary | ICD-10-CM | POA: Diagnosis not present

## 2021-01-24 DIAGNOSIS — J9811 Atelectasis: Secondary | ICD-10-CM | POA: Diagnosis not present

## 2021-01-24 DIAGNOSIS — I10 Essential (primary) hypertension: Secondary | ICD-10-CM | POA: Diagnosis not present

## 2021-01-24 DIAGNOSIS — R079 Chest pain, unspecified: Secondary | ICD-10-CM | POA: Diagnosis not present

## 2021-01-24 LAB — CBC WITH DIFFERENTIAL/PLATELET
Abs Immature Granulocytes: 0.01 10*3/uL (ref 0.00–0.07)
Basophils Absolute: 0 10*3/uL (ref 0.0–0.1)
Basophils Relative: 1 %
Eosinophils Absolute: 0 10*3/uL (ref 0.0–0.5)
Eosinophils Relative: 1 %
HCT: 39.1 % (ref 36.0–46.0)
Hemoglobin: 12.7 g/dL (ref 12.0–15.0)
Immature Granulocytes: 0 %
Lymphocytes Relative: 24 %
Lymphs Abs: 0.8 10*3/uL (ref 0.7–4.0)
MCH: 31.3 pg (ref 26.0–34.0)
MCHC: 32.5 g/dL (ref 30.0–36.0)
MCV: 96.3 fL (ref 80.0–100.0)
Monocytes Absolute: 0.4 10*3/uL (ref 0.1–1.0)
Monocytes Relative: 12 %
Neutro Abs: 2.1 10*3/uL (ref 1.7–7.7)
Neutrophils Relative %: 62 %
Platelets: 164 10*3/uL (ref 150–400)
RBC: 4.06 MIL/uL (ref 3.87–5.11)
RDW: 14.4 % (ref 11.5–15.5)
WBC: 3.4 10*3/uL — ABNORMAL LOW (ref 4.0–10.5)
nRBC: 0 % (ref 0.0–0.2)

## 2021-01-24 LAB — COMPREHENSIVE METABOLIC PANEL
ALT: 16 U/L (ref 0–44)
AST: 23 U/L (ref 15–41)
Albumin: 3.1 g/dL — ABNORMAL LOW (ref 3.5–5.0)
Alkaline Phosphatase: 51 U/L (ref 38–126)
Anion gap: 4 — ABNORMAL LOW (ref 5–15)
BUN: 19 mg/dL (ref 8–23)
CO2: 26 mmol/L (ref 22–32)
Calcium: 8.7 mg/dL — ABNORMAL LOW (ref 8.9–10.3)
Chloride: 109 mmol/L (ref 98–111)
Creatinine, Ser: 1.03 mg/dL — ABNORMAL HIGH (ref 0.44–1.00)
GFR, Estimated: 49 mL/min — ABNORMAL LOW (ref >60)
Glucose, Bld: 94 mg/dL (ref 70–99)
Potassium: 4.5 mmol/L (ref 3.5–5.1)
Sodium: 139 mmol/L (ref 135–145)
Total Bilirubin: 0.5 mg/dL (ref 0.3–1.2)
Total Protein: 5.4 g/dL — ABNORMAL LOW (ref 6.5–8.1)

## 2021-01-24 LAB — TROPONIN I (HIGH SENSITIVITY)
Troponin I (High Sensitivity): 19 ng/L — ABNORMAL HIGH (ref <18)
Troponin I (High Sensitivity): 22 ng/L — ABNORMAL HIGH (ref <18)

## 2021-01-24 MED ORDER — HYDROCODONE-ACETAMINOPHEN 5-325 MG PO TABS: 1.0000 | ORAL_TABLET | ORAL | 0 refills | Status: DC | PRN

## 2021-01-24 MED ORDER — HYDROCODONE-ACETAMINOPHEN 5-325 MG PO TABS
0.5000 | ORAL_TABLET | Freq: Once | ORAL | Status: AC
  Administered 2021-01-24: 0.5 via ORAL
  Filled 2021-01-24: qty 1

## 2021-01-24 NOTE — ED Notes (Signed)
Dc instructions reviewed with the caregiver.  Caregiver verbalized understanding.  Pt DC.

## 2021-01-24 NOTE — ED Notes (Signed)
BP 161/102 in left arm BP 170/115 in right arm

## 2021-01-24 NOTE — ED Triage Notes (Signed)
[facility] from home. Unable to ambulate per usual with cane this am d/t "back pain and HA" Pt is baseline and has dementia. Very HOH, Blind

## 2021-01-24 NOTE — ED Provider Notes (Signed)
[facility] EMERGENCY DEPARTMENT Provider Note  CSN: [id number] Arrival date & time: 01/24/21 1219    History No chief complaint on file.   HPI  [name] is a 87 y.o. female with history of multiple medical problems brought to the ED via EMS from home for evaluation. Daughter at the bedside provides much of the history. Patient is blind from glaucoma and doesn't hear well. She is essentially bed/chair bound at home but does walk occasionally with help. She was complaining of back pain and headache today which is not typical for her. Daughter states she was screaming out. She was not sure if the back pain was related to anginal pain she's had before but she is wearing a NTG patch. She has a pacemaker that is nearing end of life, but family has decided not to attempt battery replacement given her age and functional status. Patient also has pain from glaucoma from time to time but is unable to localize her headache for me. She states her pain seems to be better now. Daughter states she sometimes takes half a tab of Norco for pain as well as gabapentin.    Past Medical History:  Diagnosis Date  . Anemia   . Angina    on nitro-dur  . Coronary artery disease   . Elevated troponin   . GERD (gastroesophageal reflux disease)   . Glaucoma   . H/O myocardial perfusion scan 07/13/2011   The post stress left ventricle is normal in size. the rest left ventricle is normal in size. there is no scintigraphic evidence of inducible myocardial ishemia. The post-stress left ventricular function is normal. the post-stress ejection fraction is 66%.This is a low risk scan.  Marland Kitchen History of eye surgery    bilateral   . HOH (hard of hearing)   . Hyperlipidemia   . Hypertension   . Myocardial infarction (HCC)    x 3  . Pacemaker   . Rotator cuff tear    chronic  . Shortness of breath    with exertion  . SSS (sick sinus syndrome) (HCC) 07/25/2016    Past Surgical History:  Procedure Laterality  Date  . APPENDECTOMY    . cardiac catherization  06/2010  . CARDIAC CATHETERIZATION    . CARDIAC CATHETERIZATION Left 07/19/2010   LV showed good LV systolic function, LVH EF of 60 % to 65 %. Left main was patent. LAD has 20 % to 30 % proximal stenosis and 99 % mid stenosis with TIMI 2 distal flow. Diagonal-1 was very, very small. Diagonal-2 was very small, Diagonal-3 was small which has 30 % to 40 % ostial stenosis just before the bifurcation with LAD stenosis. Left circumflex has 40 % to 50 % mid stenosis and 70 % to 75 % .  Marland Kitchen CORONARY ANGIOPLASTY WITH STENT PLACEMENT  02/13/2012  . HYSTEROSCOPY WITH D & C  09/07/2011   Procedure: DILATATION AND CURETTAGE (D&C) /HYSTEROSCOPY;  Surgeon: Turner Daniels, MD;  Location: [facility];  Service: Gynecology;  Laterality: N/A;  uterus  . INSERT / REPLACE / REMOVE PACEMAKER    . LEFT HEART CATHETERIZATION WITH CORONARY ANGIOGRAM N/A 01/25/2012   Procedure: LEFT HEART CATHETERIZATION WITH CORONARY ANGIOGRAM;  Surgeon: Robynn Pane, MD;  Location: [facility] CATH LAB;  Service: Cardiovascular;  Laterality: N/A;  . LEFT HEART CATHETERIZATION WITH CORONARY ANGIOGRAM N/A 12/18/2014   Procedure: LEFT HEART CATHETERIZATION WITH CORONARY ANGIOGRAM;  Surgeon: Robynn Pane, MD;  Location: [facility] CATH LAB;  Service:  Cardiovascular;  Laterality: N/A;  . PACEMAKER INSERTION  06/2010  . PERCUTANEOUS CORONARY STENT INTERVENTION (PCI-S) N/A 02/13/2012   Procedure: PERCUTANEOUS CORONARY STENT INTERVENTION (PCI-S);  Surgeon: Robynn PaneMohan N Harwani, MD;  [facility];  Service: Cardiovascular;  Laterality: N/A;  . svd     x 2  . THYROIDECTOMY      Family History  Problem Relation Age of Onset  . Diabetes Mother   . Cancer Sister     Social History   Tobacco Use  . Smoking status: Former Smoker    Packs/day: 0.50    Types: Cigarettes    Quit date: 09/05/1991    Years since quitting: 29.4  . Smokeless tobacco: Never Used  Substance Use Topics  . Alcohol use: No    Comment:  none since 1992  . Drug use: No     Home Medications Prior to Admission medications   Medication Sig Start Date End Date Taking? Authorizing Provider  HYDROcodone-acetaminophen (NORCO/VICODIN) 5-325 MG tablet Take 1 tablet by mouth every 4 (four) hours as needed. 01/24/21  Yes Tegeler, Canary Brimhristopher J, MD  acetaminophen (TYLENOL) 500 MG tablet Take 500 mg by mouth every 4 (four) hours as needed for mild pain (pain).     [provider]  carvedilol (COREG) 6.25 MG tablet Take 1 tablet (6.25 mg total) by mouth 2 (two) times daily with a meal. 01/12/21   Croitoru, Mihai, MD  conjugated estrogens (PREMARIN) vaginal cream Place 1 Applicatorful vaginally 3 (three) times a week. Use at bedtime on Monday, Wednesday, Friday    [provider]  dorzolamidel-timolol (COSOPT) 22.3-6.8 MG/ML SOLN ophthalmic solution Place 1 drop into both eyes 2 (two) times daily.    [provider]  gabapentin (NEURONTIN) 100 MG capsule Take 1 capsule (100 mg total) by mouth 3 (three) times daily as needed (pain). 03/26/16   Kathlen ModyAkula, Vijaya, MD  HYDROcodone-acetaminophen (NORCO/VICODIN) 5-325 MG tablet Take 0.5 tablets by mouth every 6 (six) hours as needed for moderate pain.    [provider]  hydrocortisone (ANUSOL-HC) 2.5 % rectal cream Place 1 application rectally 2 (two) times daily as needed for hemorrhoids or itching.    [provider]  lidocaine (XYLOCAINE) 5 % ointment Apply 1 application topically as needed for mild pain (for rectal pain).    [provider]  nitroGLYCERIN (NITRODUR - DOSED IN MG/24 HR) 0.4 mg/hr patch Place 0.4 mg onto the skin every morning. REMOVE EVERY NIGHT    [provider]  omeprazole (PRILOSEC) 20 MG capsule Take 20 mg by mouth in the morning. 30 minutes before breakfast 08/18/20   [provider]  ondansetron (ZOFRAN) 8 MG tablet 1 tablet 02/02/17   [provider]  Psyllium 30.9 % POWD See admin instructions.     [provider]  ramipril (ALTACE) 10 MG capsule Take 10 mg by mouth daily.    [provider]  Tafluprost 0.0015 % SOLN Place 1 drop into both eyes at bedtime.     [provider]  torsemide (DEMADEX) 20 MG tablet Take 20 mg by mouth daily.    [provider]     Allergies    Influenza vac split [influenza virus vaccine], Tramadol, and Shellfish allergy   Review of Systems   Review of Systems Unable to assess due to mental status.    Physical Exam BP (!) 169/102   Pulse 70   Temp 97.6 F (36.4 C)   Resp 13   SpO2 99%  Physical Exam Vitals and nursing note reviewed.  Constitutional:      Appearance: Normal appearance.  HENT:     Head: Normocephalic and atraumatic.     Nose: Nose normal.     Mouth/Throat:     Mouth: Mucous membranes are moist.  Eyes:     Extraocular Movements: Extraocular movements intact.     Conjunctiva/sclera: Conjunctivae normal.  Cardiovascular:     Rate and Rhythm: Normal rate.     Comments: Pacemaker in L upper chest Pulmonary:     Effort: Pulmonary effort is normal.     Breath sounds: Normal breath sounds.  Abdominal:     General: Abdomen is flat.     Palpations: Abdomen is soft.     Tenderness: There is no abdominal tenderness.  Musculoskeletal:        General: No swelling or tenderness (No tendernes to palpation of back or neck). Normal range of motion.     Cervical back: Neck supple.     Right lower leg: Edema present.     Left lower leg: Edema present.  Skin:    General: Skin is warm and dry.  Neurological:     General: No focal deficit present.     Mental Status: She is alert.  Psychiatric:        Mood and Affect: Mood normal.      ED Results / Procedures / Treatments   Labs (all labs ordered are listed, but only abnormal results are displayed) Labs Reviewed  COMPREHENSIVE METABOLIC PANEL - Abnormal; Notable for the following components:      Result Value   Creatinine, Ser 1.03 (*)     Calcium 8.7 (*)    Total Protein 5.4 (*)    Albumin 3.1 (*)    GFR, Estimated 49 (*)    Anion gap 4 (*)    All other components within normal limits  CBC WITH DIFFERENTIAL/PLATELET - Abnormal; Notable for the following components:   WBC 3.4 (*)    All other components within normal limits  TROPONIN I (HIGH SENSITIVITY) - Abnormal; Notable for the following components:   Troponin I (High Sensitivity) 19 (*)    All other components within normal limits  TROPONIN I (HIGH SENSITIVITY) - Abnormal; Notable for the following components:   Troponin I (High Sensitivity) 22 (*)    All other components within normal limits    EKG EKG Interpretation  Date/Time:  Monday January 24 2021 12:27:48 EDT Ventricular Rate:  70 PR Interval:    QRS Duration: 121 QT Interval:  470 QTC Calculation: 508 R Axis:   -69 Text Interpretation: Ventricular-paced rhythm No significant change since last tracing Confirmed by Susy Frizzle [phone]) on 01/24/2021 2:10:37 PM   Radiology CT Head Wo Contrast  Result Date: 01/24/2021 CLINICAL DATA:  Headache, intracranial hemorrhage suspected. unable to ambulate per usual with cane this am d/t "back pain and HA" Patient is baseline and has dementia; EXAM: CT HEAD WITHOUT CONTRAST TECHNIQUE: Contiguous axial images were obtained from the base of the skull through the vertex without intravenous contrast. COMPARISON:  None. FINDINGS: Brain: Cerebral ventricle sizes are concordant with the degree of mild cerebral volume loss. Patchy and confluent areas of decreased attenuation are noted throughout the deep and periventricular white matter of the cerebral hemispheres bilaterally, compatible with chronic microvascular ischemic disease. No evidence of large-territorial acute infarction. No parenchymal hemorrhage. No mass lesion. No extra-axial collection. No mass effect or midline shift. No hydrocephalus. Basilar cisterns are patent. Vascular:  No hyperdense vessel.  Atherosclerotic calcifications are present within the cavernous internal carotid and vertebral arteries. Skull: No acute fracture or focal lesion. Sinuses/Orbits: Paranasal sinuses and mastoid air cells are clear. The orbits are unremarkable. Other: None. IMPRESSION: No acute intracranial abnormality. Electronically Signed   By: Tish Frederickson M.D.   On: 01/24/2021 17:24   DG Chest Port 1 View  Result Date: 01/24/2021 CLINICAL DATA:  Back pain, chest pain EXAM: PORTABLE CHEST 1 VIEW COMPARISON:  January 2021 FINDINGS: Cardiomegaly likely similar to prior study allowing for differences in technique. Left chest wall dual lead pacemaker. Small right pleural effusion and right basilar atelectasis. No pneumothorax. IMPRESSION: Small right pleural effusion and right basilar atelectasis. Likely similar cardiomegaly. Electronically Signed   By: Guadlupe Spanish M.D.   On: 01/24/2021 14:42    Procedures Procedures  Medications Ordered in the ED Medications  HYDROcodone-acetaminophen (NORCO/VICODIN) 5-325 MG per tablet 0.5 tablet (0.5 tablets Oral Given 01/24/21 1420)     MDM Rules/Calculators/A&P MDM Patient here with vague back pain and headache. Discussed with the daughter we can do some screening labs and imaging. Treat her pain with norco and reassess.   ED Course  I have reviewed the triage vital signs and the nursing notes.  Pertinent labs & imaging results that were available during my care of the patient were reviewed by me and considered in my medical decision making (see chart for details).  Clinical Course as of 01/25/21 1455  Mon Amy 28, 2022  1448 CXR with small effusion, no significant change.  [CS]  1512 Care of the patient signed out to Dr. Rush Landmark at the change of shift.  [CS]    Clinical Course User Index [CS] Pollyann Savoy, MD    Final Clinical Impression(s) / ED Diagnoses Final diagnoses:  Acute nonintractable headache, unspecified headache type  Acute back pain,  unspecified back location, unspecified back pain laterality    Rx / DC Orders ED Discharge Orders         Ordered    HYDROcodone-acetaminophen (NORCO/VICODIN) 5-325 MG tablet  Every 4 hours PRN        01/24/21 2002           Pollyann Savoy, MD 01/25/21 1455

## 2021-01-24 NOTE — Discharge Instructions (Signed)
Your work-up today was overall reassuring and similar to prior.  Please follow-up with your ophthalmologist for your headaches and eye pain, your cardiologist, and your PCP.  Please use the pain medicine as you have been to help with your symptoms.  Please rest and stay hydrated.  If any symptoms change or worsen acutely, please return to the nearest emergency department

## 2021-01-24 NOTE — ED Provider Notes (Signed)
3:13 PM Care sent from Dr. Bernette Mayers.  At time of transfer of care, patient awaiting reassessment after screening laboratory testing.  Patient had work-up to look for emergent causes of the back pain and headache today.  On assessment by the previous team, patient symptoms have improved and plan of care is to discharge home if work-up does not show any concerning findings.  7:56 PM Work-up was finally all returned and did not show significantly elevated or abnormal findings.  Troponin was elevated from 14-19 and 22 respectively.  She was not complaining of any further chest discomfort.  Other labs similar to prior.  CT head showed no acute abnormality and chest x-ray did not show convincing evidence of pneumonia.  A small effusion was seen and we discussed with the family.  Family thinks her symptoms are related to her glaucoma and chronic pains.  We again discussed checking eye pressures which patient refused earlier with the previous team.  We will have her follow-up with her ophthalmology team, cardiology team, and her PCP for further management.  They requested further medication of her Norco that she has broken in half and done well with.  We will give her a prescription for several tablets and have her follow-up with all these providers.  Family had no other questions or concerns and patient discharged in good condition after overall reassuring work-up.  Clinical Impression: 1. Acute nonintractable headache, unspecified headache type   2. Acute back pain, unspecified back location, unspecified back pain laterality     Disposition: Discharge  Condition: Good  I have discussed the results, Dx and Tx plan with the pt(& family if present). He/she/they expressed understanding and agree(s) with the plan. Discharge instructions discussed at great length. Strict return precautions discussed and pt &/or family have verbalized understanding of the instructions. No further questions at time of discharge.     New Prescriptions   HYDROCODONE-ACETAMINOPHEN (NORCO/VICODIN) 5-325 MG TABLET    Take 1 tablet by mouth every 4 (four) hours as needed.    Follow Up: Clovis Riley, L.August Saucer, MD [address] [place] [address] [phone]     Your cardiologist and your ophthalmologist     [facility] EMERGENCY DEPARTMENT [address] [id number] mc [place] [address] [phone]      .   [name], Amy Brim, MD 01/24/21 2003

## 2021-02-03 DIAGNOSIS — R300 Dysuria: Secondary | ICD-10-CM | POA: Diagnosis not present

## 2021-02-08 ENCOUNTER — Emergency Department (HOSPITAL_COMMUNITY): Payer: Medicare PPO

## 2021-02-08 ENCOUNTER — Emergency Department (HOSPITAL_COMMUNITY)
Admission: EM | Admit: 2021-02-08 | Discharge: 2021-02-09 | Disposition: A | Discharge status: Home / Self Care | Payer: Medicare PPO | Attending: Emergency Medicine | Admitting: Emergency Medicine

## 2021-02-08 DIAGNOSIS — R600 Localized edema: Secondary | ICD-10-CM | POA: Diagnosis not present

## 2021-02-08 DIAGNOSIS — I10 Essential (primary) hypertension: Secondary | ICD-10-CM | POA: Insufficient documentation

## 2021-02-08 DIAGNOSIS — Z79899 Other long term (current) drug therapy: Secondary | ICD-10-CM | POA: Diagnosis not present

## 2021-02-08 DIAGNOSIS — R0902 Hypoxemia: Secondary | ICD-10-CM | POA: Diagnosis not present

## 2021-02-08 DIAGNOSIS — R109 Unspecified abdominal pain: Secondary | ICD-10-CM | POA: Insufficient documentation

## 2021-02-08 DIAGNOSIS — R1033 Periumbilical pain: Secondary | ICD-10-CM

## 2021-02-08 DIAGNOSIS — D72819 Decreased white blood cell count, unspecified: Secondary | ICD-10-CM | POA: Insufficient documentation

## 2021-02-08 DIAGNOSIS — Z950 Presence of cardiac pacemaker: Secondary | ICD-10-CM | POA: Diagnosis not present

## 2021-02-08 DIAGNOSIS — M545 Low back pain, unspecified: Secondary | ICD-10-CM | POA: Diagnosis not present

## 2021-02-08 DIAGNOSIS — R1084 Generalized abdominal pain: Secondary | ICD-10-CM | POA: Diagnosis not present

## 2021-02-08 DIAGNOSIS — I2510 Atherosclerotic heart disease of native coronary artery without angina pectoris: Secondary | ICD-10-CM | POA: Diagnosis not present

## 2021-02-08 DIAGNOSIS — K5730 Diverticulosis of large intestine without perforation or abscess without bleeding: Secondary | ICD-10-CM | POA: Diagnosis not present

## 2021-02-08 DIAGNOSIS — K219 Gastro-esophageal reflux disease without esophagitis: Secondary | ICD-10-CM | POA: Diagnosis not present

## 2021-02-08 DIAGNOSIS — Z87891 Personal history of nicotine dependence: Secondary | ICD-10-CM | POA: Insufficient documentation

## 2021-02-08 DIAGNOSIS — N280 Ischemia and infarction of kidney: Secondary | ICD-10-CM | POA: Diagnosis not present

## 2021-02-08 DIAGNOSIS — D171 Benign lipomatous neoplasm of skin and subcutaneous tissue of trunk: Secondary | ICD-10-CM | POA: Diagnosis not present

## 2021-02-08 DIAGNOSIS — N2889 Other specified disorders of kidney and ureter: Secondary | ICD-10-CM | POA: Diagnosis not present

## 2021-02-08 LAB — CBC WITH DIFFERENTIAL/PLATELET
Abs Immature Granulocytes: 0.01 10*3/uL (ref 0.00–0.07)
Basophils Absolute: 0 10*3/uL (ref 0.0–0.1)
Basophils Relative: 1 %
Eosinophils Absolute: 0 10*3/uL (ref 0.0–0.5)
Eosinophils Relative: 1 %
HCT: 41.7 % (ref 36.0–46.0)
Hemoglobin: 13.1 g/dL (ref 12.0–15.0)
Immature Granulocytes: 0 %
Lymphocytes Relative: 23 %
Lymphs Abs: 0.8 10*3/uL (ref 0.7–4.0)
MCH: 31 pg (ref 26.0–34.0)
MCHC: 31.4 g/dL (ref 30.0–36.0)
MCV: 98.8 fL (ref 80.0–100.0)
Monocytes Absolute: 0.4 10*3/uL (ref 0.1–1.0)
Monocytes Relative: 11 %
Neutro Abs: 2.2 10*3/uL (ref 1.7–7.7)
Neutrophils Relative %: 64 %
Platelets: 134 10*3/uL — ABNORMAL LOW (ref 150–400)
RBC: 4.22 MIL/uL (ref 3.87–5.11)
RDW: 14.9 % (ref 11.5–15.5)
WBC: 3.5 10*3/uL — ABNORMAL LOW (ref 4.0–10.5)
nRBC: 0 % (ref 0.0–0.2)

## 2021-02-08 LAB — COMPREHENSIVE METABOLIC PANEL
ALT: 24 U/L (ref 0–44)
AST: 28 U/L (ref 15–41)
Albumin: 3.5 g/dL (ref 3.5–5.0)
Alkaline Phosphatase: 55 U/L (ref 38–126)
Anion gap: 10 (ref 5–15)
BUN: 14 mg/dL (ref 8–23)
CO2: 21 mmol/L — ABNORMAL LOW (ref 22–32)
Calcium: 8.7 mg/dL — ABNORMAL LOW (ref 8.9–10.3)
Chloride: 110 mmol/L (ref 98–111)
Creatinine, Ser: 0.94 mg/dL (ref 0.44–1.00)
GFR, Estimated: 54 mL/min — ABNORMAL LOW (ref >60)
Glucose, Bld: 114 mg/dL — ABNORMAL HIGH (ref 70–99)
Potassium: 4.1 mmol/L (ref 3.5–5.1)
Sodium: 141 mmol/L (ref 135–145)
Total Bilirubin: 1.1 mg/dL (ref 0.3–1.2)
Total Protein: 5.9 g/dL — ABNORMAL LOW (ref 6.5–8.1)

## 2021-02-08 LAB — LIPASE, BLOOD: Lipase: 24 U/L (ref 11–51)

## 2021-02-08 MED ORDER — FENTANYL CITRATE (PF) 100 MCG/2ML IJ SOLN
25.0000 ug | Freq: Once | INTRAMUSCULAR | Status: AC
Start: 2021-02-08 — End: 2021-02-08
  Administered 2021-02-08: 25 ug via INTRAVENOUS
  Filled 2021-02-08: qty 2

## 2021-02-08 MED ORDER — IOHEXOL 300 MG/ML  SOLN
100.0000 mL | Freq: Once | INTRAMUSCULAR | Status: AC | PRN
  Administered 2021-02-08: 100 mL via INTRAVENOUS

## 2021-02-08 MED ORDER — CARVEDILOL 3.125 MG PO TABS
6.2500 mg | ORAL_TABLET | Freq: Once | ORAL | Status: DC
  Filled 2021-02-08: qty 2

## 2021-02-08 MED ORDER — HYDROCODONE-ACETAMINOPHEN 5-325 MG PO TABS
1.0000 | ORAL_TABLET | Freq: Once | ORAL | Status: DC
  Filled 2021-02-08: qty 1

## 2021-02-08 NOTE — ED Notes (Signed)
RN signed MSE as pt refusal per pt being severely demented and legally blind.

## 2021-02-08 NOTE — ED Triage Notes (Signed)
Pt bib [facility] for abd pain x2 days per family.  ABD soft and non-distended.  Pt has sever demenita and is legally blind.

## 2021-02-08 NOTE — ED Provider Notes (Signed)
[facility] EMERGENCY DEPARTMENT Provider Note   CSN: [id number] Arrival date & time: 02/08/21  1724     History No chief complaint on file.   [name] is a 87 y.o. female presenting for evaluation of abdominal pain.  Level 5 caveat due to patient being extremely hard of hearing.  History provided by patient's granddaughter.  She states that the past 2 days, patient has had abdominal pain/crying out due to abdominal pain for 2 days.  Her last bowel movement was Sunday, 2 days ago.  Additionally, for the past week patient has had a vaginal odor.  She saw her PCP within the past week, had a negative UA for UTI.  No known fevers, chills, nausea, vomiting.  HPI     Past Medical History:  Diagnosis Date  . Anemia   . Angina    on nitro-dur  . Coronary artery disease   . Elevated troponin   . GERD (gastroesophageal reflux disease)   . Glaucoma   . H/O myocardial perfusion scan 07/13/2011   The post stress left ventricle is normal in size. the rest left ventricle is normal in size. there is no scintigraphic evidence of inducible myocardial ishemia. The post-stress left ventricular function is normal. the post-stress ejection fraction is 66%.This is a low risk scan.  Marland Kitchen History of eye surgery    bilateral   . HOH (hard of hearing)   . Hyperlipidemia   . Hypertension   . Myocardial infarction (HCC)    x 3  . Pacemaker   . Rotator cuff tear    chronic  . Shortness of breath    with exertion  . SSS (sick sinus syndrome) (HCC) 07/25/2016    Patient Active Problem List   Diagnosis Date Noted  . Impingement syndrome of left shoulder 09/15/2016  . SSS (sick sinus syndrome) (HCC) 07/25/2016  . Paroxysmal atrial fibrillation (HCC) 07/25/2016  . Elevated troponin 03/24/2016  . Left shoulder pain 03/24/2016  . Rotator cuff tear   . HOH (hard of hearing)   . Left arm pain   . Unstable angina (HCC) 11/18/2015  . Chest pain 12/17/2014  . CAD (coronary  artery disease) 04/11/2013  . HTN (hypertension) 04/11/2013  . Pacemaker   . Hyperlipidemia     Past Surgical History:  Procedure Laterality Date  . APPENDECTOMY    . cardiac catherization  06/2010  . CARDIAC CATHETERIZATION    . CARDIAC CATHETERIZATION Left 07/19/2010   LV showed good LV systolic function, LVH EF of 60 % to 65 %. Left main was patent. LAD has 20 % to 30 % proximal stenosis and 99 % mid stenosis with TIMI 2 distal flow. Diagonal-1 was very, very small. Diagonal-2 was very small, Diagonal-3 was small which has 30 % to 40 % ostial stenosis just before the bifurcation with LAD stenosis. Left circumflex has 40 % to 50 % mid stenosis and 70 % to 75 % .  Marland Kitchen CORONARY ANGIOPLASTY WITH STENT PLACEMENT  02/13/2012  . HYSTEROSCOPY WITH D & C  09/07/2011   Procedure: DILATATION AND CURETTAGE (D&C) /HYSTEROSCOPY;  Surgeon: Turner Daniels, MD;  Location: [facility];  Service: Gynecology;  Laterality: N/A;  uterus  . INSERT / REPLACE / REMOVE PACEMAKER    . LEFT HEART CATHETERIZATION WITH CORONARY ANGIOGRAM N/A 01/25/2012   Procedure: LEFT HEART CATHETERIZATION WITH CORONARY ANGIOGRAM;  Surgeon: Robynn Pane, MD;  Location: [facility] CATH LAB;  Service: Cardiovascular;  Laterality: N/A;  .  LEFT HEART CATHETERIZATION WITH CORONARY ANGIOGRAM N/A 12/18/2014   Procedure: LEFT HEART CATHETERIZATION WITH CORONARY ANGIOGRAM;  Surgeon: Robynn Pane, MD;  Location: [facility] CATH LAB;  Service: Cardiovascular;  Laterality: N/A;  . PACEMAKER INSERTION  06/2010  . PERCUTANEOUS CORONARY STENT INTERVENTION (PCI-S) N/A 02/13/2012   Procedure: PERCUTANEOUS CORONARY STENT INTERVENTION (PCI-S);  Surgeon: Robynn Pane, MD;  Location: [facility] CATH LAB;  Service: Cardiovascular;  Laterality: N/A;  . svd     x 2  . THYROIDECTOMY       OB History   No obstetric history on file.     Family History  Problem Relation Age of Onset  . Diabetes Mother   . Cancer Sister     Social History   Tobacco Use  . Smoking status:  Former Smoker    Packs/day: 0.50    Types: Cigarettes    Quit date: 09/05/1991    Years since quitting: 29.4  . Smokeless tobacco: Never Used  Substance Use Topics  . Alcohol use: No    Comment: none since 1992  . Drug use: No    Home Medications Prior to Admission medications   Medication Sig Start Date End Date Taking? Authorizing Provider  acetaminophen (TYLENOL) 500 MG tablet Take 500 mg by mouth every 4 (four) hours as needed for mild pain (pain).     [provider]  carvedilol (COREG) 6.25 MG tablet Take 1 tablet (6.25 mg total) by mouth 2 (two) times daily with a meal. 01/12/21   Croitoru, Mihai, MD  conjugated estrogens (PREMARIN) vaginal cream Place 1 Applicatorful vaginally 3 (three) times a week. Use at bedtime on Monday, Wednesday, Friday    [provider]  dorzolamidel-timolol (COSOPT) 22.3-6.8 MG/ML SOLN ophthalmic solution Place 1 drop into both eyes 2 (two) times daily.    [provider]  gabapentin (NEURONTIN) 100 MG capsule Take 1 capsule (100 mg total) by mouth 3 (three) times daily as needed (pain). 03/26/16   Kathlen Mody, MD  HYDROcodone-acetaminophen (NORCO/VICODIN) 5-325 MG tablet Take 0.5 tablets by mouth every 6 (six) hours as needed for moderate pain.    [provider]  HYDROcodone-acetaminophen (NORCO/VICODIN) 5-325 MG tablet Take 1 tablet by mouth every 4 (four) hours as needed. 01/24/21   Tegeler, Canary Brim, MD  hydrocortisone (ANUSOL-HC) 2.5 % rectal cream Place 1 application rectally 2 (two) times daily as needed for hemorrhoids or itching.    [provider]  lidocaine (XYLOCAINE) 5 % ointment Apply 1 application topically as needed for mild pain (for rectal pain).    [provider]  nitroGLYCERIN (NITRODUR - DOSED IN MG/24 HR) 0.4 mg/hr patch Place 0.4 mg onto the skin every morning. REMOVE EVERY NIGHT    [provider]  omeprazole (PRILOSEC) 20 MG capsule Take 20 mg by mouth in the  morning. 30 minutes before breakfast 08/18/20   [provider]  ondansetron (ZOFRAN) 8 MG tablet 1 tablet 02/02/17   [provider]  Psyllium 30.9 % POWD See admin instructions.    [provider]  ramipril (ALTACE) 10 MG capsule Take 10 mg by mouth daily.    [provider]  Tafluprost 0.0015 % SOLN Place 1 drop into both eyes at bedtime.     [provider]  torsemide (DEMADEX) 20 MG tablet Take 20 mg by mouth daily.    [provider]    Allergies    Influenza vac split [influenza virus vaccine], Tramadol, and Shellfish allergy  Review  of Systems   Review of Systems  Unable to perform ROS: Other  Gastrointestinal: Positive for abdominal pain.    Physical Exam Updated Vital Signs SpO2 98%   Physical Exam Vitals and nursing note reviewed.  Constitutional:      General: She is not in acute distress.    Appearance: She is cachectic.     Comments: Appears chronically ill. Not in acute distress.   HENT:     Head: Normocephalic and atraumatic.  Eyes:     Extraocular Movements: Extraocular movements intact.     Conjunctiva/sclera: Conjunctivae normal.     Pupils: Pupils are equal, round, and reactive to light.  Cardiovascular:     Rate and Rhythm: Normal rate and regular rhythm.     Pulses: Normal pulses.  Pulmonary:     Effort: Pulmonary effort is normal. No respiratory distress.     Breath sounds: Normal breath sounds. No wheezing.  Abdominal:     General: There is no distension.     Palpations: Abdomen is soft. There is no mass.     Tenderness: There is no abdominal tenderness. There is no guarding or rebound.     Comments: No clear ttp. Exam difficult due to pt cooperation  Musculoskeletal:        General: Normal range of motion.     Cervical back: Normal range of motion and neck supple.     Right lower leg: Edema present.     Left lower leg: Edema present.     Comments: 2+ pitting edema bilaterally.   Skin:     General: Skin is warm and dry.     Capillary Refill: Capillary refill takes less than 2 seconds.  Neurological:     Mental Status: She is alert.     ED Results / Procedures / Treatments   Labs (all labs ordered are listed, but only abnormal results are displayed) Labs Reviewed  CBC WITH DIFFERENTIAL/PLATELET  COMPREHENSIVE METABOLIC PANEL  LIPASE, BLOOD  URINALYSIS, ROUTINE W REFLEX MICROSCOPIC    EKG EKG Interpretation  Date/Time:  Tuesday February 08 2021 17:55:31 EDT Ventricular Rate:  70 PR Interval:  33 QRS Duration: 129 QT Interval:  475 QTC Calculation: 513 R Axis:   -77 Text Interpretation: VENTRICULAR PACED RHYTHM Confirmed by Tilden Fossa [phone]) on 02/08/2021 6:04:09 PM   Radiology No results found.  Procedures Procedures   Medications Ordered in ED Medications - No data to display  ED Course  I have reviewed the triage vital signs and the nursing notes.  Pertinent labs & imaging results that were available during my care of the patient were reviewed by me and considered in my medical decision making (see chart for details).    MDM Rules/Calculators/A&P                          Patient presenting for evaluation of abdominal pain.  On exam, patient appears chronically ill, but not in acute distress.  She does have some pitting edema.  Difficult to assess for focal abdominal pain as patient becomes very upset whenever she is touched anywhere.  Will obtain labs and CT abdomen pelvis for further evaluation.  Labs interpreted by me, overall reassuring.  Mild leukopenia, similar to baseline.  CT abdomen pelvis shows abnormality of the right kidney, consider infarct versus pyelo.  Urine is pending.  Additionally, patient with a pericardial effusion.  Per chart review, patient has a pacemaker his battery is nearing the  end of its life.  Patient and family have decided not to pursue surgery.  This is likely contributing to the edema and pericardial effusion.   Patient without tachycardia or signs of respiratory distress at this time.  Pt signed out to S Upstill, PA-C for f/u on UA. If positive, pt can likely be d/c with abx if she is pain controlled. If negative, higher concern for possible renal infarct, and pt may benefit from admission for consultation/palliative management.   Final Clinical Impression(s) / ED Diagnoses Final diagnoses:  None    Rx / DC Orders ED Discharge Orders    None       Alveria ApleyCaccavale, Vanessia Bokhari, PA-C 02/09/21 0017    Tilden Fossaees, Elizabeth, MD 02/11/21 2236

## 2021-02-08 NOTE — ED Provider Notes (Signed)
87 yo patient, here with abdominal pain X "few days" CT = rt renal infarct vs pyelonephritis UA pending  If UA definitively positive, can go home If infarct, consult nephrology regarding appropriate management  Plan: review UA Recheck blood pressure for improvement  UA is negative for infection, raising the question of diagnosis of infarct. On recheck, the patient has refused to take any PO medications, however, blood pressure is improving over time as the patient becomes for settled and is resting.   Discussed management of renal infarction with nephrology. Inpatient work up/management is not felt necessary. Suggested possible renal artery duplex inpatient or outpatient.    Discussed with urology, Dr. Marlou Porch, who advises no management necessary.   Discussed home pain management with granddaughter. She is currently taking 0.5 Norco tablet which was not helping. She received Fentanyl here and seems to be more comfortable. Per granddaughter at bedside, she complains only of how uncomfortable the bed is, not of belly pain. Discussed admission vs discharge home. Granddaughter reports she is comfortable taking the patient home. Suggested increasing the Norco to a whole table if needed. Recommended close PCP follow up and to return to the ED if pain was uncontrolled, or if any new concern develops.   Elpidio Anis, PA-C 02/09/21 0128    Tilden Fossa, MD 02/11/21 [phone]

## 2021-02-09 LAB — URINALYSIS, ROUTINE W REFLEX MICROSCOPIC
Bacteria, UA: NONE SEEN
Bilirubin Urine: NEGATIVE
Glucose, UA: NEGATIVE mg/dL
Ketones, ur: NEGATIVE mg/dL
Leukocytes,Ua: NEGATIVE
Nitrite: NEGATIVE
Protein, ur: >=300 mg/dL — AB
Specific Gravity, Urine: 1.03 (ref 1.005–1.030)
pH: 5 (ref 5.0–8.0)

## 2021-02-09 MED ORDER — HYDROCODONE-ACETAMINOPHEN 5-325 MG PO TABS: 0.5000 | ORAL_TABLET | Freq: Four times a day (QID) | ORAL | 0 refills | Status: AC | PRN

## 2021-02-09 NOTE — ED Notes (Signed)
DC instructions reviewed with the pt's family.  Understanding was verbalized.  Pt DC

## 2021-02-09 NOTE — Discharge Instructions (Addendum)
You can take 1/2 to 1 tablet of Norco for pain control. You can also take ibuprofen 400 mg every 6 hours for additional relief as needed.   Please return to the emergency department with any uncontrolled pain, fever, vomiting or new concern.

## 2021-02-09 NOTE — ED Notes (Signed)
Pt is currentlly refusing to take oral meds.

## 2021-02-17 ENCOUNTER — Ambulatory Visit (INDEPENDENT_AMBULATORY_CARE_PROVIDER_SITE_OTHER): Payer: Medicare PPO

## 2021-02-17 DIAGNOSIS — I495 Sick sinus syndrome: Secondary | ICD-10-CM

## 2021-02-19 LAB — CUP PACEART REMOTE DEVICE CHECK
Battery Remaining Longevity: 1 mo
Battery Remaining Percentage: 0.5 %
Battery Voltage: 2.57 V
Brady Statistic AP VP Percent: 94 %
Brady Statistic AP VS Percent: 1 %
Brady Statistic AS VP Percent: 3.6 %
Brady Statistic AS VS Percent: 1 %
Brady Statistic RA Percent Paced: 3.4 %
Brady Statistic RV Percent Paced: 99 %
Date Time Interrogation Session: 20220422095617
Implantable Lead Implant Date: 20110923
Implantable Lead Implant Date: 20110923
Implantable Lead Location: 753859
Implantable Lead Location: 753860
Implantable Pulse Generator Implant Date: 20110923
Lead Channel Impedance Value: 400 Ohm
Lead Channel Impedance Value: 440 Ohm
Lead Channel Pacing Threshold Amplitude: 0.5 V
Lead Channel Pacing Threshold Amplitude: 0.75 V
Lead Channel Pacing Threshold Pulse Width: 0.4 ms
Lead Channel Pacing Threshold Pulse Width: 0.4 ms
Lead Channel Sensing Intrinsic Amplitude: 1 mV
Lead Channel Sensing Intrinsic Amplitude: 12 mV
Lead Channel Setting Pacing Amplitude: 1 V
Lead Channel Setting Pacing Amplitude: 2 V
Lead Channel Setting Pacing Pulse Width: 0.4 ms
Lead Channel Setting Sensing Sensitivity: 2 mV
Pulse Gen Model: 2210
Pulse Gen Serial Number: 2561895

## 2021-02-24 DIAGNOSIS — M545 Low back pain, unspecified: Secondary | ICD-10-CM | POA: Diagnosis not present

## 2021-02-24 DIAGNOSIS — L602 Onychogryphosis: Secondary | ICD-10-CM | POA: Diagnosis not present

## 2021-02-24 DIAGNOSIS — R102 Pelvic and perineal pain: Secondary | ICD-10-CM | POA: Diagnosis not present

## 2021-02-24 DIAGNOSIS — I739 Peripheral vascular disease, unspecified: Secondary | ICD-10-CM | POA: Diagnosis not present

## 2021-02-24 DIAGNOSIS — N9489 Other specified conditions associated with female genital organs and menstrual cycle: Secondary | ICD-10-CM | POA: Diagnosis not present

## 2021-03-08 NOTE — Addendum Note (Signed)
Addended by: Elease Etienne A on: 03/08/2021 09:37 AM   Modules accepted: Level of Service

## 2021-03-08 NOTE — Progress Notes (Signed)
Remote pacemaker transmission.   

## 2021-03-21 ENCOUNTER — Ambulatory Visit (INDEPENDENT_AMBULATORY_CARE_PROVIDER_SITE_OTHER): Payer: Medicare PPO

## 2021-03-21 DIAGNOSIS — I495 Sick sinus syndrome: Secondary | ICD-10-CM

## 2021-03-22 LAB — CUP PACEART REMOTE DEVICE CHECK
Battery Remaining Longevity: 1 mo
Battery Remaining Percentage: 0.5 %
Battery Voltage: 2.57 V
Brady Statistic AP VP Percent: 94 %
Brady Statistic AP VS Percent: 1 %
Brady Statistic AS VP Percent: 3.6 %
Brady Statistic AS VS Percent: 1 %
Brady Statistic RA Percent Paced: 3.3 %
Brady Statistic RV Percent Paced: 99 %
Date Time Interrogation Session: 20220522131230
Implantable Lead Implant Date: 20110923
Implantable Lead Implant Date: 20110923
Implantable Lead Location: 753859
Implantable Lead Location: 753860
Implantable Pulse Generator Implant Date: 20110923
Lead Channel Impedance Value: 390 Ohm
Lead Channel Impedance Value: 480 Ohm
Lead Channel Pacing Threshold Amplitude: 0.5 V
Lead Channel Pacing Threshold Amplitude: 0.75 V
Lead Channel Pacing Threshold Pulse Width: 0.4 ms
Lead Channel Pacing Threshold Pulse Width: 0.4 ms
Lead Channel Sensing Intrinsic Amplitude: 1 mV
Lead Channel Sensing Intrinsic Amplitude: 12 mV
Lead Channel Setting Pacing Amplitude: 1 V
Lead Channel Setting Pacing Amplitude: 2 V
Lead Channel Setting Pacing Pulse Width: 0.4 ms
Lead Channel Setting Sensing Sensitivity: 2 mV
Pulse Gen Model: 2210
Pulse Gen Serial Number: 2561895

## 2021-03-31 DIAGNOSIS — M150 Primary generalized (osteo)arthritis: Secondary | ICD-10-CM | POA: Diagnosis not present

## 2021-03-31 DIAGNOSIS — I2510 Atherosclerotic heart disease of native coronary artery without angina pectoris: Secondary | ICD-10-CM | POA: Diagnosis not present

## 2021-03-31 DIAGNOSIS — K219 Gastro-esophageal reflux disease without esophagitis: Secondary | ICD-10-CM | POA: Diagnosis not present

## 2021-03-31 DIAGNOSIS — R102 Pelvic and perineal pain: Secondary | ICD-10-CM | POA: Diagnosis not present

## 2021-03-31 DIAGNOSIS — I10 Essential (primary) hypertension: Secondary | ICD-10-CM | POA: Diagnosis not present

## 2021-04-07 DIAGNOSIS — I10 Essential (primary) hypertension: Secondary | ICD-10-CM | POA: Diagnosis not present

## 2021-04-07 DIAGNOSIS — E785 Hyperlipidemia, unspecified: Secondary | ICD-10-CM | POA: Diagnosis not present

## 2021-04-07 DIAGNOSIS — I5020 Unspecified systolic (congestive) heart failure: Secondary | ICD-10-CM | POA: Diagnosis not present

## 2021-04-07 DIAGNOSIS — I495 Sick sinus syndrome: Secondary | ICD-10-CM | POA: Diagnosis not present

## 2021-04-07 DIAGNOSIS — I25118 Atherosclerotic heart disease of native coronary artery with other forms of angina pectoris: Secondary | ICD-10-CM | POA: Diagnosis not present

## 2021-04-12 NOTE — Progress Notes (Signed)
Remote pacemaker transmission.   

## 2021-04-12 NOTE — Addendum Note (Signed)
Addended by: Geralyn Flash D on: 04/12/2021 01:29 PM   Modules accepted: Level of Service

## 2021-04-15 ENCOUNTER — Telehealth: Payer: Self-pay | Admitting: Emergency Medicine

## 2021-04-15 NOTE — Telephone Encounter (Signed)
Attempted to notify patient that battery on pacemaker has reached ERI as of June  11 th. Unable to contact patient routing to Dr. Royann Shivers and scheduling to set up appointment. Will re-attempt to contact patient or DPR.

## 2021-04-26 NOTE — Telephone Encounter (Signed)
Spoke with the patient's granddaughter. She confirmed that they will not go through with the procedure due to the patient's declining health. She would like to keep the appointment on 7/6 to talk about what to expect (appointment with Alejandro Mulling, PA)

## 2021-05-03 NOTE — Progress Notes (Deleted)
Electrophysiology Office Note Date: 05/03/2021  ID:  [name], DOB Jul 16, 1918, MRN [id number]  PCP: Asencion Gowda.August Saucer, MD Primary Cardiologist: None Electrophysiologist: Thurmon Fair, MD   CC: Pacemaker follow-up  [name] is a 87 y.o. female seen today for Thurmon Fair, MD for routine electrophysiology followup.  Since last being seen in our clinic the patient reports doing ***.  she denies chest pain, palpitations, dyspnea, PND, orthopnea, nausea, vomiting, dizziness, syncope, edema, weight gain, or early satiety.  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 the rest left ventricle is normal in size. there is no scintigraphic evidence of inducible myocardial ishemia. The post-stress left ventricular function is normal. the post-stress ejection fraction is 66%.This is a low risk scan.   History of eye surgery    bilateral    HOH (hard of hearing)    Hyperlipidemia    Hypertension    Myocardial infarction (HCC)    x 3   Pacemaker    Rotator cuff tear    chronic   Shortness of breath    with exertion   SSS (sick sinus syndrome) (HCC) 07/25/2016   Past Surgical History:  Procedure Laterality Date   APPENDECTOMY     cardiac catherization  06/2010   CARDIAC CATHETERIZATION     CARDIAC CATHETERIZATION Left 07/19/2010   LV showed good LV systolic function, LVH EF of 60 % to 65 %. Left main was patent. LAD has 20 % to 30 % proximal stenosis and 99 % mid stenosis with TIMI 2 distal flow. Diagonal-1 was very, very small. Diagonal-2 was very small, Diagonal-3 was small which has 30 % to 40 % ostial stenosis just before the bifurcation with LAD stenosis. Left  circumflex has 40 % to 50 % mid stenosis and 70 % to 75 % .   CORONARY ANGIOPLASTY WITH STENT PLACEMENT  02/13/2012   HYSTEROSCOPY WITH D & C  09/07/2011   Procedure: DILATATION AND CURETTAGE (D&C) /HYSTEROSCOPY;  Surgeon: Turner Daniels, MD;  Location: [facility];  Service: Gynecology;  Laterality: N/A;  uterus   INSERT / REPLACE / REMOVE PACEMAKER     LEFT HEART CATHETERIZATION WITH CORONARY ANGIOGRAM N/A 01/25/2012   Procedure: LEFT HEART CATHETERIZATION WITH CORONARY ANGIOGRAM;  Surgeon: Robynn Pane, MD;  Location: [facility] CATH LAB;  Service: Cardiovascular;  Laterality: N/A;   LEFT HEART CATHETERIZATION WITH CORONARY ANGIOGRAM N/A 12/18/2014   Procedure: LEFT HEART CATHETERIZATION WITH CORONARY ANGIOGRAM;  Surgeon: Robynn Pane, MD;  Location: [facility] CATH LAB;  Service: Cardiovascular;  Laterality: N/A;   PACEMAKER INSERTION  06/2010   PERCUTANEOUS CORONARY STENT INTERVENTION (PCI-S) N/A 02/13/2012   Procedure: PERCUTANEOUS CORONARY STENT INTERVENTION (PCI-S);  Surgeon: Robynn Pane, MD;  Location: [facility] CATH LAB;  Service: Cardiovascular;  Laterality: N/A;   svd     x 2   THYROIDECTOMY      Current Outpatient Medications  Medication Sig Dispense Refill   acetaminophen (TYLENOL) 500 MG tablet Take 500 mg by mouth every 4 (four) hours as needed for mild pain (pain).      carvedilol (COREG) 6.25 MG tablet Take 1  tablet (6.25 mg total) by mouth 2 (two) times daily with a meal. 180 tablet 3   conjugated estrogens (PREMARIN) vaginal cream Place 1 Applicatorful vaginally 3 (three) times a week. Use at bedtime on Monday, Wednesday, Friday     dorzolamidel-timolol (COSOPT) 22.3-6.8 MG/ML SOLN ophthalmic solution Place 1 drop into both eyes 2 (two) times daily.     gabapentin (NEURONTIN) 100 MG capsule Take 1 capsule (100 mg total) by mouth 3 (three) times daily as needed (pain). 90 capsule 0   HYDROcodone-acetaminophen (NORCO/VICODIN) 5-325 MG tablet Take 0.5-1 tablets by mouth every 6 (six) hours as needed  for moderate pain. 30 tablet 0   hydrocortisone (ANUSOL-HC) 2.5 % rectal cream Place 1 application rectally 2 (two) times daily as needed for hemorrhoids or itching.     lidocaine (XYLOCAINE) 5 % ointment Apply 1 application topically as needed for mild pain (for rectal pain).     nitroGLYCERIN (NITRODUR - DOSED IN MG/24 HR) 0.4 mg/hr patch Place 0.4 mg onto the skin every morning. REMOVE EVERY NIGHT     omeprazole (PRILOSEC) 20 MG capsule Take 20 mg by mouth in the morning. 30 minutes before breakfast     ondansetron (ZOFRAN) 8 MG tablet 1 tablet     Psyllium 30.9 % POWD See admin instructions.     ramipril (ALTACE) 10 MG capsule Take 10 mg by mouth daily.     Tafluprost 0.0015 % SOLN Place 1 drop into both eyes at bedtime.      torsemide (DEMADEX) 20 MG tablet Take 20 mg by mouth daily.     No current facility-administered medications for this visit.    Allergies:   Influenza vac split [influenza virus vaccine], Tramadol, and Shellfish allergy   Social History: Social History   Socioeconomic History   Marital status: Widowed    Spouse name: Not on file   Number of children: 2   Years of education: Not on file   Highest education level: Not on file  Occupational History   Occupation: retired  Tobacco Use   Smoking status: Former    Packs/day: 0.50    Pack years: 0.00    Types: Cigarettes    Quit date: 09/05/1991    Years since quitting: 29.6   Smokeless tobacco: Never  Substance and Sexual Activity   Alcohol use: No    Comment: none since 1992   Drug use: No   Sexual activity: Not Currently  Other Topics Concern   Not on file  Social History Narrative   Not on file   Social Determinants of Health   Financial Resource Strain: Not on file  Food Insecurity: Not on file  Transportation Needs: Not on file  Physical Activity: Not on file  Stress: Not on file  Social Connections: Not on file  Intimate Partner Violence: Not on file    Family History: Family History   Problem Relation Age of Onset   Diabetes Mother    Cancer Sister      Review of Systems: All other systems reviewed and are otherwise negative except as noted above.  Physical Exam: There were no vitals filed for this visit.   GEN- The patient is well appearing, alert and oriented x 3 today.   HEENT: normocephalic, atraumatic; sclera clear, conjunctiva pink; hearing intact; oropharynx clear; neck supple  Lungs- Clear to ausculation bilaterally, normal work of breathing.  No wheezes, rales, rhonchi Heart- Regular rate and rhythm, no murmurs, rubs or gallops  GI- soft, non-tender, non-distended,  bowel sounds present  Extremities- no clubbing or cyanosis. No edema MS- no significant deformity or atrophy Skin- warm and dry, no rash or lesion; PPM pocket well healed Psych- euthymic mood, full affect Neuro- strength and sensation are intact  PPM Interrogation- reviewed in detail today,  See PACEART report  EKG:  EKG is not ordered today.  Recent Labs: 02/08/2021: ALT 24; BUN 14; Creatinine, Ser 0.94; Hemoglobin 13.1; Platelets 134; Potassium 4.1; Sodium 141   Wt Readings from Last 3 Encounters:  01/12/21 101 lb 3.2 oz (45.9 kg)  09/20/20 98 lb (44.5 kg)  09/15/19 96 lb 12.8 oz (43.9 kg)     Other studies Reviewed: Additional studies/ records that were reviewed today include: Previous EP office notes, Previous remote checks, Most recent labwork.   Assessment and Plan:  1. Sick sinus syndrome s/p [employer] PPM  Normal PPM function See Pace Art report No changes today  2. CAD Asymptomatic.  *** coreg  3. HTN Blood pressure ***  4. Afib ***  5. Goals of Care As previously discussed, her quality of life has continually declined.  She is blind and nearly completely deaf. She mostly declines hearing aids.  She is frail and feeble appearing. Her device has *** until ERI In shared decision process, Dr. Royann Shivers and family have decided not to proceed with generator  change. We further discussed and confirmed that decision today.  Beneath her pacemaker, ***  Current medicines are reviewed at length with the patient today.   The patient {ACTIONS; HAS/DOES NOT HAVE:19233} concerns regarding her medicines.  The following changes were made today:  {NONE DEFAULTED:18576}  Labs/ tests ordered today include: *** No orders of the defined types were placed in this encounter.    Disposition:   Follow up with {Blank single:19197::"Dr. Allred","Dr. Amada Jupiter. Klein","Dr. Camnitz","Dr. Lambert","EP APP"} in *** {Blank single:19197::"Months","Weeks"}    Signed, Graciella Freer, PA-C  05/03/2021 9:04 PM  [facility] [address] [place] [address] [phone] (office) [phone] (fax)

## 2021-05-04 ENCOUNTER — Encounter: Payer: Medicare PPO | Admitting: Student

## 2021-05-24 NOTE — Progress Notes (Deleted)
Electrophysiology Office Note Date: 05/24/2021  ID:  [name], DOB 16-Jul-1918, MRN [id number]  PCP: Asencion Gowda.August Saucer, MD Primary Cardiologist: None Electrophysiologist: Thurmon Fair, MD ***  CC: Pacemaker follow-up  [name] is a 87 y.o. female seen today for Thurmon Fair, MD for {Blank single:19197::"cardiac clearance","post hospital follow up","acute visit due to ***","routine electrophysiology followup"}.  Since {Blank single:19197::"last being seen in our clinic","discharge from hospital"} the patient reports doing ***.  she denies chest pain, palpitations, dyspnea, PND, orthopnea, nausea, vomiting, dizziness, syncope, edema, weight gain, or early satiety.  Device History: {Blank single:19197::"Medtronic","St. Jude","[employer]","Biotronik"} {Blank single:19197::"Dual Chamber","Single Chamber","BiV","Leadless"} PPM implanted *** for ***  Past Medical History:  Diagnosis Date   Anemia    Angina    on nitro-dur   Coronary artery disease    Elevated troponin    GERD (gastroesophageal reflux disease)    Glaucoma    H/O myocardial perfusion scan 07/13/2011   The post stress left ventricle is normal in size. the rest left ventricle is normal in size. there is no scintigraphic evidence of inducible myocardial ishemia. The post-stress left ventricular function is normal. the post-stress ejection fraction is 66%.This is a low risk scan.   History of eye surgery    bilateral    HOH (hard of hearing)    Hyperlipidemia    Hypertension    Myocardial infarction (HCC)    x 3   Pacemaker    Rotator cuff tear    chronic   Shortness of breath    with exertion   SSS (sick sinus syndrome) (HCC) 07/25/2016   Past Surgical History:  Procedure Laterality Date   APPENDECTOMY     cardiac catherization  06/2010   CARDIAC CATHETERIZATION     CARDIAC CATHETERIZATION Left 07/19/2010   LV showed good LV systolic function, LVH EF of 60 % to 65 %. Left main was patent. LAD  has 20 % to 30 % proximal stenosis and 99 % mid stenosis with TIMI 2 distal flow. Diagonal-1 was very, very small. Diagonal-2 was very small, Diagonal-3 was small which has 30 % to 40 % ostial stenosis just before the bifurcation with LAD stenosis. Left circumflex has 40 % to 50 % mid stenosis and 70 % to 75 % .   CORONARY ANGIOPLASTY WITH STENT PLACEMENT  02/13/2012   HYSTEROSCOPY WITH D & C  09/07/2011   Procedure: DILATATION AND CURETTAGE (D&C) /HYSTEROSCOPY;  Surgeon: Turner Daniels, MD;  Location: [facility];  Service: Gynecology;  Laterality: N/A;  uterus   INSERT / REPLACE / REMOVE PACEMAKER     LEFT HEART CATHETERIZATION WITH CORONARY ANGIOGRAM N/A 01/25/2012   Procedure: LEFT HEART CATHETERIZATION WITH CORONARY ANGIOGRAM;  Surgeon: Robynn Pane, MD;  Location: [facility] CATH [facility];  Service: Cardiovascular;  Laterality: N/A;   LEFT HEART CATHETERIZATION WITH CORONARY ANGIOGRAM N/A 12/18/2014   Procedure: LEFT HEART CATHETERIZATION WITH CORONARY ANGIOGRAM;  Surgeon: Robynn Pane, MD;  Location: [facility]ATH [facility];  Service: Cardiovascular;  Laterality: N/A;   PACEMAKER INSERTION  06/2010   PERCUTANEOUS CORONARY STENT INTERVENTION (PCI-S) N/A 02/13/2012   Procedure: PERCUTANEOUS CORONARY STENT INTERVENTION (PCI-S);  Surgeon: Robynn Pane, MD;  Location: [facility] CATH [facility];  Service: Cardiovascular;  Laterality: N/A;   svd     x 2   THYROIDECTOMY      Current Outpatient Medications  Medication Sig Dispense Refill   acetaminophen (TYLENOL) 500 MG tablet Take 500 mg by mouth every 4 (four) hours as needed  for mild pain (pain).      carvedilol (COREG) 6.25 MG tablet Take 1 tablet (6.25 mg total) by mouth 2 (two) times daily with a meal. 180 tablet 3   conjugated estrogens (PREMARIN) vaginal cream Place 1 Applicatorful vaginally 3 (three) times a week. Use at bedtime on Monday, Wednesday, Friday     dorzolamidel-timolol (COSOPT) 22.3-6.8 MG/ML SOLN ophthalmic solution Place 1 drop into both eyes 2 (two) times daily.      gabapentin (NEURONTIN) 100 MG capsule Take 1 capsule (100 mg total) by mouth 3 (three) times daily as needed (pain). 90 capsule 0   HYDROcodone-acetaminophen (NORCO/VICODIN) 5-325 MG tablet Take 0.5-1 tablets by mouth every 6 (six) hours as needed for moderate pain. 30 tablet 0   hydrocortisone (ANUSOL-HC) 2.5 % rectal cream Place 1 application rectally 2 (two) times daily as needed for hemorrhoids or itching.     lidocaine (XYLOCAINE) 5 % ointment Apply 1 application topically as needed for mild pain (for rectal pain).     nitroGLYCERIN (NITRODUR - DOSED IN MG/24 HR) 0.4 mg/hr patch Place 0.4 mg onto the skin every morning. REMOVE EVERY NIGHT     omeprazole (PRILOSEC) 20 MG capsule Take 20 mg by mouth in the morning. 30 minutes before breakfast     ondansetron (ZOFRAN) 8 MG tablet 1 tablet     Psyllium 30.9 % POWD See admin instructions.     ramipril (ALTACE) 10 MG capsule Take 10 mg by mouth daily.     Tafluprost 0.0015 % SOLN Place 1 drop into both eyes at bedtime.      torsemide (DEMADEX) 20 MG tablet Take 20 mg by mouth daily.     No current facility-administered medications for this visit.    Allergies:   Influenza vac split [influenza virus vaccine], Tramadol, and Shellfish allergy   Social History: Social History   Socioeconomic History   Marital status: Widowed    Spouse name: Not on file   Number of children: 2   Years of education: Not on file   Highest education level: Not on file  Occupational History   Occupation: retired  Tobacco Use   Smoking status: Former    Packs/day: 0.50    Types: Cigarettes    Quit date: 09/05/1991    Years since quitting: 29.7   Smokeless tobacco: Never  Substance and Sexual Activity   Alcohol use: No    Comment: none since 1992   Drug use: No   Sexual activity: Not Currently  Other Topics Concern   Not on file  Social History Narrative   Not on file   Social Determinants of Health   Financial Resource Strain: Not on file  Food  Insecurity: Not on file  Transportation Needs: Not on file  Physical Activity: Not on file  Stress: Not on file  Social Connections: Not on file  Intimate Partner Violence: Not on file    Family History: Family History  Problem Relation Age of Onset   Diabetes Mother    Cancer Sister      Review of Systems: All other systems reviewed and are otherwise negative except as noted above.  Physical Exam: There were no vitals filed for this visit.   GEN- The patient is well appearing, alert and oriented x 3 today.   HEENT: normocephalic, atraumatic; sclera clear, conjunctiva pink; hearing intact; oropharynx clear; neck supple  Lungs- Clear to ausculation bilaterally, normal work of breathing.  No wheezes, rales, rhonchi Heart- Regular rate and rhythm,  no murmurs, rubs or gallops  GI- soft, non-tender, non-distended, bowel sounds present  Extremities- no clubbing or cyanosis. No edema MS- no significant deformity or atrophy Skin- warm and dry, no rash or lesion; PPM pocket well healed Psych- euthymic mood, full affect Neuro- strength and sensation are intact  PPM Interrogation- reviewed in detail today,  See PACEART report  EKG:  EKG {ACTION; IS/IS [id number]} ordered today. The ekg ordered today shows ***  Recent Labs: 02/08/2021: ALT 24; BUN 14; Creatinine, Ser 0.94; Hemoglobin 13.1; Platelets 134; Potassium 4.1; Sodium 141   Wt Readings from Last 3 Encounters:  01/12/21 101 lb 3.2 oz (45.9 kg)  09/20/20 98 lb (44.5 kg)  09/15/19 96 lb 12.8 oz (43.9 kg)     Other studies Reviewed: Additional studies/ records that were reviewed today include: Previous EP office notes, Previous remote checks, Most recent labwork.   Assessment and Plan:  1. Sick sinus syndrome s/p St. Jude PPM  Normal PPM function See Pace Art report No changes today *** dependent *** coreg  2. Persistent Atrial fibrillation Not on OAC given advanced age and very poor functional status.   3.  Goals of Care Her quality of life has deteriorated substantially over the last couple of years.  She finds it very hard to communications she is blind and almost completely deaf and does not want to wear hearing aids.  Sleeps most of the day.  She is underweight and appears very feeble and frail.  The family has talked it over and I do not think it is in [name] best interest to proceed with a pacemaker generator change.  Described the typical behavior of a pacemaker as the battery reaches and exceeds end of service.   *** shared decision making  4. CAD Asymptomatic. No clear anginal symptoms.   Current medicines are reviewed at length with the patient today.   The patient {ACTIONS; HAS/DOES NOT HAVE:19233} concerns regarding her medicines.  The following changes were made today:  {NONE DEFAULTED:18576}  Labs/ tests ordered today include: *** No orders of the defined types were placed in this encounter.    Disposition:   Follow up with {Blank single:19197::"Dr. Allred","Dr. Amada Jupiter. Klein","Dr. Camnitz","Dr. Lambert","EP APP"} in *** {Blank single:19197::"Months","Weeks"}    Signed, Graciella Freer, PA-C  05/24/2021 2:03 PM  [facility] [address] [place] [address] [phone] (office) [phone] (fax)

## 2021-05-25 ENCOUNTER — Encounter: Payer: Medicare PPO | Admitting: Student

## 2021-05-25 DIAGNOSIS — I441 Atrioventricular block, second degree: Secondary | ICD-10-CM

## 2021-05-25 DIAGNOSIS — I4819 Other persistent atrial fibrillation: Secondary | ICD-10-CM

## 2021-05-25 DIAGNOSIS — I495 Sick sinus syndrome: Secondary | ICD-10-CM

## 2021-05-25 DIAGNOSIS — I25118 Atherosclerotic heart disease of native coronary artery with other forms of angina pectoris: Secondary | ICD-10-CM

## 2021-06-02 ENCOUNTER — Encounter (HOSPITAL_COMMUNITY): Payer: Self-pay | Admitting: *Deleted

## 2021-06-02 ENCOUNTER — Emergency Department (HOSPITAL_BASED_OUTPATIENT_CLINIC_OR_DEPARTMENT_OTHER): Payer: Medicare PPO

## 2021-06-02 ENCOUNTER — Emergency Department (HOSPITAL_COMMUNITY): Payer: Medicare PPO

## 2021-06-02 ENCOUNTER — Other Ambulatory Visit: Payer: Self-pay

## 2021-06-02 ENCOUNTER — Inpatient Hospital Stay (HOSPITAL_COMMUNITY)
Admission: EM | Admit: 2021-06-02 | Discharge: 2021-06-30 | DRG: 299 | Disposition: E | Payer: Medicare PPO | Attending: Internal Medicine | Admitting: Internal Medicine

## 2021-06-02 DIAGNOSIS — I5033 Acute on chronic diastolic (congestive) heart failure: Secondary | ICD-10-CM | POA: Diagnosis present

## 2021-06-02 DIAGNOSIS — I480 Paroxysmal atrial fibrillation: Secondary | ICD-10-CM | POA: Diagnosis present

## 2021-06-02 DIAGNOSIS — I441 Atrioventricular block, second degree: Secondary | ICD-10-CM | POA: Diagnosis present

## 2021-06-02 DIAGNOSIS — R7989 Other specified abnormal findings of blood chemistry: Secondary | ICD-10-CM | POA: Diagnosis present

## 2021-06-02 DIAGNOSIS — Z91013 Allergy to seafood: Secondary | ICD-10-CM | POA: Diagnosis not present

## 2021-06-02 DIAGNOSIS — Z887 Allergy status to serum and vaccine status: Secondary | ICD-10-CM | POA: Diagnosis not present

## 2021-06-02 DIAGNOSIS — Z87891 Personal history of nicotine dependence: Secondary | ICD-10-CM

## 2021-06-02 DIAGNOSIS — I82432 Acute embolism and thrombosis of left popliteal vein: Secondary | ICD-10-CM | POA: Diagnosis present

## 2021-06-02 DIAGNOSIS — M79604 Pain in right leg: Secondary | ICD-10-CM

## 2021-06-02 DIAGNOSIS — E890 Postprocedural hypothyroidism: Secondary | ICD-10-CM | POA: Diagnosis present

## 2021-06-02 DIAGNOSIS — M7989 Other specified soft tissue disorders: Secondary | ICD-10-CM | POA: Diagnosis not present

## 2021-06-02 DIAGNOSIS — I82402 Acute embolism and thrombosis of unspecified deep veins of left lower extremity: Secondary | ICD-10-CM

## 2021-06-02 DIAGNOSIS — Z833 Family history of diabetes mellitus: Secondary | ICD-10-CM

## 2021-06-02 DIAGNOSIS — Z6820 Body mass index (BMI) 20.0-20.9, adult: Secondary | ICD-10-CM | POA: Diagnosis not present

## 2021-06-02 DIAGNOSIS — I10 Essential (primary) hypertension: Secondary | ICD-10-CM | POA: Diagnosis present

## 2021-06-02 DIAGNOSIS — I495 Sick sinus syndrome: Secondary | ICD-10-CM | POA: Diagnosis present

## 2021-06-02 DIAGNOSIS — I110 Hypertensive heart disease with heart failure: Secondary | ICD-10-CM | POA: Diagnosis present

## 2021-06-02 DIAGNOSIS — Z955 Presence of coronary angioplasty implant and graft: Secondary | ICD-10-CM | POA: Diagnosis not present

## 2021-06-02 DIAGNOSIS — N179 Acute kidney failure, unspecified: Secondary | ICD-10-CM | POA: Diagnosis present

## 2021-06-02 DIAGNOSIS — Z950 Presence of cardiac pacemaker: Secondary | ICD-10-CM | POA: Diagnosis present

## 2021-06-02 DIAGNOSIS — E785 Hyperlipidemia, unspecified: Secondary | ICD-10-CM | POA: Diagnosis present

## 2021-06-02 DIAGNOSIS — F0390 Unspecified dementia without behavioral disturbance: Secondary | ICD-10-CM | POA: Diagnosis present

## 2021-06-02 DIAGNOSIS — I82409 Acute embolism and thrombosis of unspecified deep veins of unspecified lower extremity: Secondary | ICD-10-CM | POA: Diagnosis present

## 2021-06-02 DIAGNOSIS — D6959 Other secondary thrombocytopenia: Secondary | ICD-10-CM | POA: Diagnosis present

## 2021-06-02 DIAGNOSIS — Z515 Encounter for palliative care: Secondary | ICD-10-CM | POA: Diagnosis not present

## 2021-06-02 DIAGNOSIS — R0602 Shortness of breath: Secondary | ICD-10-CM | POA: Diagnosis not present

## 2021-06-02 DIAGNOSIS — I2510 Atherosclerotic heart disease of native coronary artery without angina pectoris: Secondary | ICD-10-CM | POA: Diagnosis present

## 2021-06-02 DIAGNOSIS — K219 Gastro-esophageal reflux disease without esophagitis: Secondary | ICD-10-CM | POA: Diagnosis present

## 2021-06-02 DIAGNOSIS — R638 Other symptoms and signs concerning food and fluid intake: Secondary | ICD-10-CM | POA: Diagnosis not present

## 2021-06-02 DIAGNOSIS — I517 Cardiomegaly: Secondary | ICD-10-CM | POA: Diagnosis not present

## 2021-06-02 DIAGNOSIS — M79605 Pain in left leg: Secondary | ICD-10-CM | POA: Diagnosis not present

## 2021-06-02 DIAGNOSIS — Z7189 Other specified counseling: Secondary | ICD-10-CM | POA: Diagnosis not present

## 2021-06-02 DIAGNOSIS — J90 Pleural effusion, not elsewhere classified: Secondary | ICD-10-CM | POA: Diagnosis not present

## 2021-06-02 DIAGNOSIS — I509 Heart failure, unspecified: Secondary | ICD-10-CM | POA: Diagnosis not present

## 2021-06-02 DIAGNOSIS — I252 Old myocardial infarction: Secondary | ICD-10-CM

## 2021-06-02 DIAGNOSIS — Z743 Need for continuous supervision: Secondary | ICD-10-CM | POA: Diagnosis not present

## 2021-06-02 DIAGNOSIS — R531 Weakness: Secondary | ICD-10-CM | POA: Diagnosis present

## 2021-06-02 DIAGNOSIS — Z66 Do not resuscitate: Secondary | ICD-10-CM | POA: Diagnosis present

## 2021-06-02 DIAGNOSIS — R609 Edema, unspecified: Secondary | ICD-10-CM | POA: Diagnosis not present

## 2021-06-02 DIAGNOSIS — R64 Cachexia: Secondary | ICD-10-CM | POA: Diagnosis present

## 2021-06-02 DIAGNOSIS — R5381 Other malaise: Secondary | ICD-10-CM | POA: Diagnosis not present

## 2021-06-02 LAB — CBC WITH DIFFERENTIAL/PLATELET
Abs Immature Granulocytes: 0.02 10*3/uL (ref 0.00–0.07)
Basophils Absolute: 0 10*3/uL (ref 0.0–0.1)
Basophils Relative: 0 %
Eosinophils Absolute: 0 10*3/uL (ref 0.0–0.5)
Eosinophils Relative: 0 %
HCT: 44.5 % (ref 36.0–46.0)
Hemoglobin: 14.4 g/dL (ref 12.0–15.0)
Immature Granulocytes: 1 %
Lymphocytes Relative: 22 %
Lymphs Abs: 0.7 10*3/uL (ref 0.7–4.0)
MCH: 32.1 pg (ref 26.0–34.0)
MCHC: 32.4 g/dL (ref 30.0–36.0)
MCV: 99.1 fL (ref 80.0–100.0)
Monocytes Absolute: 0.3 10*3/uL (ref 0.1–1.0)
Monocytes Relative: 9 %
Neutro Abs: 2.2 10*3/uL (ref 1.7–7.7)
Neutrophils Relative %: 68 %
Platelets: 101 10*3/uL — ABNORMAL LOW (ref 150–400)
RBC: 4.49 MIL/uL (ref 3.87–5.11)
RDW: 14.9 % (ref 11.5–15.5)
WBC: 3.2 10*3/uL — ABNORMAL LOW (ref 4.0–10.5)
nRBC: 0 % (ref 0.0–0.2)

## 2021-06-02 LAB — BASIC METABOLIC PANEL
Anion gap: 9 (ref 5–15)
BUN: 19 mg/dL (ref 8–23)
CO2: 27 mmol/L (ref 22–32)
Calcium: 8.9 mg/dL (ref 8.9–10.3)
Chloride: 106 mmol/L (ref 98–111)
Creatinine, Ser: 1.21 mg/dL — ABNORMAL HIGH (ref 0.44–1.00)
GFR, Estimated: 40 mL/min — ABNORMAL LOW (ref >60)
Glucose, Bld: 118 mg/dL — ABNORMAL HIGH (ref 70–99)
Potassium: 4.5 mmol/L (ref 3.5–5.1)
Sodium: 142 mmol/L (ref 135–145)

## 2021-06-02 LAB — BRAIN NATRIURETIC PEPTIDE: B Natriuretic Peptide: 3535.9 pg/mL — ABNORMAL HIGH (ref 0.0–100.0)

## 2021-06-02 MED ORDER — HYDROCORTISONE 2.5 % RE CREA: 1.0000 "application " | TOPICAL_CREAM | Freq: Two times a day (BID) | RECTAL | Status: DC | PRN

## 2021-06-02 MED ORDER — GLYCOPYRROLATE 0.2 MG/ML IJ SOLN
0.2000 mg | INTRAMUSCULAR | Status: DC | PRN
  Administered 2021-06-05: 0.2 mg via INTRAVENOUS
  Filled 2021-06-02: qty 1

## 2021-06-02 MED ORDER — ONDANSETRON HCL 4 MG/2ML IJ SOLN: 4.0000 mg | Freq: Four times a day (QID) | INTRAMUSCULAR | Status: DC | PRN

## 2021-06-02 MED ORDER — ACETAMINOPHEN 650 MG RE SUPP: 650.0000 mg | Freq: Four times a day (QID) | RECTAL | Status: DC | PRN

## 2021-06-02 MED ORDER — ACETAMINOPHEN 325 MG PO TABS: 650.0000 mg | ORAL_TABLET | Freq: Four times a day (QID) | ORAL | Status: DC | PRN

## 2021-06-02 MED ORDER — SODIUM CHLORIDE 0.9% FLUSH: 3.0000 mL | INTRAVENOUS | Status: DC | PRN

## 2021-06-02 MED ORDER — LORAZEPAM 1 MG PO TABS: 1.0000 mg | ORAL_TABLET | ORAL | Status: DC | PRN

## 2021-06-02 MED ORDER — GLYCOPYRROLATE 1 MG PO TABS: 1.0000 mg | ORAL_TABLET | ORAL | Status: DC | PRN

## 2021-06-02 MED ORDER — SODIUM CHLORIDE 0.9% FLUSH
3.0000 mL | Freq: Two times a day (BID) | INTRAVENOUS | Status: DC
  Administered 2021-06-02 – 2021-06-04 (×4): 3 mL via INTRAVENOUS

## 2021-06-02 MED ORDER — SODIUM CHLORIDE 0.9 % IV SOLN: 250.0000 mL | INTRAVENOUS | Status: DC | PRN

## 2021-06-02 MED ORDER — DORZOLAMIDE HCL-TIMOLOL MAL PF 22.3-6.8 MG/ML OP SOLN: 1.0000 [drp] | Freq: Two times a day (BID) | OPHTHALMIC | Status: DC

## 2021-06-02 MED ORDER — LATANOPROST 0.005 % OP SOLN
1.0000 [drp] | Freq: Every day | OPHTHALMIC | Status: DC
  Administered 2021-06-02 – 2021-06-04 (×3): 1 [drp] via OPHTHALMIC
  Filled 2021-06-02: qty 2.5

## 2021-06-02 MED ORDER — MORPHINE SULFATE (PF) 2 MG/ML IV SOLN: 1.0000 mg | INTRAVENOUS | Status: DC | PRN

## 2021-06-02 MED ORDER — HALOPERIDOL LACTATE 2 MG/ML PO CONC
0.5000 mg | ORAL | Status: DC | PRN
  Filled 2021-06-02: qty 0.3

## 2021-06-02 MED ORDER — POLYVINYL ALCOHOL 1.4 % OP SOLN: 1.0000 [drp] | Freq: Four times a day (QID) | OPHTHALMIC | Status: DC | PRN

## 2021-06-02 MED ORDER — ENOXAPARIN SODIUM 60 MG/0.6ML IJ SOSY
45.0000 mg | PREFILLED_SYRINGE | INTRAMUSCULAR | Status: DC
  Administered 2021-06-02: 45 mg via SUBCUTANEOUS
  Filled 2021-06-02: qty 0.6
  Filled 2021-06-02: qty 0.45

## 2021-06-02 MED ORDER — NITROGLYCERIN 0.4 MG/HR TD PT24
0.4000 mg | MEDICATED_PATCH | Freq: Every morning | TRANSDERMAL | Status: DC
  Administered 2021-06-03 – 2021-06-04 (×2): 0.4 mg via TRANSDERMAL
  Filled 2021-06-02 (×2): qty 1

## 2021-06-02 MED ORDER — HALOPERIDOL LACTATE 5 MG/ML IJ SOLN: 0.5000 mg | INTRAMUSCULAR | Status: DC | PRN

## 2021-06-02 MED ORDER — BIOTENE DRY MOUTH MT LIQD: 15.0000 mL | OROMUCOSAL | Status: DC | PRN

## 2021-06-02 MED ORDER — OXYCODONE HCL 5 MG PO TABS
5.0000 mg | ORAL_TABLET | ORAL | Status: DC | PRN
Start: 2021-06-02 — End: 2021-06-06
  Administered 2021-06-04: 5 mg via ORAL
  Filled 2021-06-02: qty 1

## 2021-06-02 MED ORDER — PANTOPRAZOLE SODIUM 40 MG PO TBEC
40.0000 mg | DELAYED_RELEASE_TABLET | Freq: Every day | ORAL | Status: DC
  Administered 2021-06-03 – 2021-06-04 (×2): 40 mg via ORAL
  Filled 2021-06-02 (×2): qty 1

## 2021-06-02 MED ORDER — ONDANSETRON 4 MG PO TBDP
4.0000 mg | ORAL_TABLET | Freq: Four times a day (QID) | ORAL | Status: DC | PRN
  Filled 2021-06-02: qty 1

## 2021-06-02 MED ORDER — CARVEDILOL 6.25 MG PO TABS
6.2500 mg | ORAL_TABLET | Freq: Two times a day (BID) | ORAL | Status: DC
  Administered 2021-06-02 – 2021-06-03 (×2): 6.25 mg via ORAL
  Filled 2021-06-02 (×2): qty 1

## 2021-06-02 MED ORDER — GLYCOPYRROLATE 0.2 MG/ML IJ SOLN: 0.2000 mg | INTRAMUSCULAR | Status: DC | PRN

## 2021-06-02 MED ORDER — HALOPERIDOL 0.5 MG PO TABS
0.5000 mg | ORAL_TABLET | ORAL | Status: DC | PRN
  Filled 2021-06-02: qty 1

## 2021-06-02 MED ORDER — LORAZEPAM 2 MG/ML PO CONC: 1.0000 mg | ORAL | Status: DC | PRN

## 2021-06-02 MED ORDER — LORAZEPAM 2 MG/ML IJ SOLN: 1.0000 mg | INTRAMUSCULAR | Status: DC | PRN

## 2021-06-02 MED ORDER — DORZOLAMIDE HCL-TIMOLOL MAL 2-0.5 % OP SOLN
1.0000 [drp] | Freq: Two times a day (BID) | OPHTHALMIC | Status: DC
  Administered 2021-06-02 – 2021-06-04 (×4): 1 [drp] via OPHTHALMIC
  Filled 2021-06-02: qty 10

## 2021-06-02 MED ORDER — FAMOTIDINE 20 MG PO TABS: 20.0000 mg | ORAL_TABLET | Freq: Every day | ORAL | Status: DC

## 2021-06-02 NOTE — ED Notes (Signed)
Vascular at bedside

## 2021-06-02 NOTE — Progress Notes (Signed)
ANTICOAGULATION CONSULT NOTE - Initial Consult  Pharmacy Consult for Enoxaparin Indication: DVT  Allergies  Allergen Reactions   Influenza Vac Split [Influenza Virus Vaccine] Other (See Comments)    unknown   Tramadol Nausea And Vomiting   Shellfish Allergy Itching and Rash    shrimp    Patient Measurements: Weight: 45.9 kg (101 lb 3.1 oz)  Vital Signs: Temp: 97.9 F (36.6 C) (08/04 1119) Temp Source: Rectal (08/04 1119) BP: 182/119 (08/04 1430) Pulse Rate: 79 (08/04 1430)  Labs: Recent Labs    06/15/2021 1320  HGB 14.4  HCT 44.5  PLT 101*  CREATININE 1.21*    Estimated Creatinine Clearance: 16.9 mL/min (A) (by C-G formula based on SCr of 1.21 mg/dL (H)).   Medical History: Past Medical History:  Diagnosis Date   Anemia    Angina    on nitro-dur   Coronary artery disease    Elevated troponin    GERD (gastroesophageal reflux disease)    Glaucoma    H/O myocardial perfusion scan 07/13/2011   The post stress left ventricle is normal in size. the rest left ventricle is normal in size. there is no scintigraphic evidence of inducible myocardial ishemia. The post-stress left ventricular function is normal. the post-stress ejection fraction is 66%.This is a low risk scan.   History of eye surgery    bilateral    HOH (hard of hearing)    Hyperlipidemia    Hypertension    Myocardial infarction (HCC)    x 3   Pacemaker    Rotator cuff tear    chronic   Shortness of breath    with exertion   SSS (sick sinus syndrome) (HCC) 07/25/2016    Assessment: 87 YO female who presented with altered mental status. She has notable leg swelling with pitting edema. Work up is concerning for CHF exacerbation with a DVT in the left popliteal vein. Patient has a pacemaker that needs a battery change, however, the patient and family has opted for palliative care. CrCl is 16.9, Scr 1.21, plts 101.  Goal of Therapy:  Monitor platelets by anticoagulation protocol: Yes   Plan:   Lovenox 45 mg SQ q24h Monitor renal function and platelets.    March Rummage, PharmD PGY1 Pharmacy Resident  Please check AMION for all [facility] pharmacy phone numbers After 10:00 PM call main pharmacy [phone]

## 2021-06-02 NOTE — ED Provider Notes (Signed)
[facility] EMERGENCY DEPARTMENT Provider Note   CSN: [id number] Arrival date & time: 06/13/2021  1045     History Chief Complaint  Patient presents with   Extremity Weakness    [name] is a 87 y.o. female.  HPI  87 year old female with past medical history of CAD status post stent, HTN, HLD, sick sinus syndrome with pacemaker in place who is very hard of hearing and partially blind presents the emergency department accompanied by her granddaughter for concern of confusion and weakness.  Granddaughter is the primary history giver, the patient is confused and unable to hear questions.  The granddaughter cares for the patient, states that she usually ambulates with a walker however over the past couple days her legs have become significantly swollen.  She is no longer ambulatory even with the help of her granddaughter.  Over the past couple days she has been having a difficult time sleeping at night, confused and calling her different names.  She gets agitated easily.  Patient's pacemaker is due for battery change however the family has made a joint decision to not pursue this and do not believe that they would want any further aggressive medical care.  Past Medical History:  Diagnosis Date   Anemia    Angina    on nitro-dur   Coronary artery disease    Elevated troponin    GERD (gastroesophageal reflux disease)    Glaucoma    H/O myocardial perfusion scan 07/13/2011   The post stress left ventricle is normal in size. the rest left ventricle is normal in size. there is no scintigraphic evidence of inducible myocardial ishemia. The post-stress left ventricular function is normal. the post-stress ejection fraction is 66%.This is a low risk scan.   History of eye surgery    bilateral    HOH (hard of hearing)    Hyperlipidemia    Hypertension    Myocardial infarction (HCC)    x 3   Pacemaker    Rotator cuff tear    chronic   Shortness of breath    with  exertion   SSS (sick sinus syndrome) (HCC) 07/25/2016    Patient Active Problem List   Diagnosis Date Noted   Impingement syndrome of left shoulder 09/15/2016   SSS (sick sinus syndrome) (HCC) 07/25/2016   Paroxysmal atrial fibrillation (HCC) 07/25/2016   Elevated troponin 03/24/2016   Left shoulder pain 03/24/2016   Rotator cuff tear    HOH (hard of hearing)    Left arm pain    Unstable angina (HCC) 11/18/2015   Chest pain 12/17/2014   CAD (coronary artery disease) 04/11/2013   HTN (hypertension) 04/11/2013   Pacemaker    Hyperlipidemia     Past Surgical History:  Procedure Laterality Date   APPENDECTOMY     cardiac catherization  06/2010   CARDIAC CATHETERIZATION     CARDIAC CATHETERIZATION Left 07/19/2010   LV showed good LV systolic function, LVH EF of 60 % to 65 %. Left main was patent. LAD has 20 % to 30 % proximal stenosis and 99 % mid stenosis with TIMI 2 distal flow. Diagonal-1 was very, very small. Diagonal-2 was very small, Diagonal-3 was small which has 30 % to 40 % ostial stenosis just before the bifurcation with LAD stenosis. Left circumflex has 40 % to 50 % mid stenosis and 70 % to 75 % .   CORONARY ANGIOPLASTY WITH STENT PLACEMENT  02/13/2012   HYSTEROSCOPY WITH D & C  09/07/2011  Procedure: DILATATION AND CURETTAGE (D&C) /HYSTEROSCOPY;  Surgeon: Turner Daniels, MD;  Location: [facility];  Service: Gynecology;  Laterality: N/A;  uterus   INSERT / REPLACE / REMOVE PACEMAKER     LEFT HEART CATHETERIZATION WITH CORONARY ANGIOGRAM N/A 01/25/2012   Procedure: LEFT HEART CATHETERIZATION WITH CORONARY ANGIOGRAM;  Surgeon: Robynn Pane, MD;  Location: [facility] CATH LAB;  Service: Cardiovascular;  Laterality: N/A;   LEFT HEART CATHETERIZATION WITH CORONARY ANGIOGRAM N/A 12/18/2014   Procedure: LEFT HEART CATHETERIZATION WITH CORONARY ANGIOGRAM;  Surgeon: Robynn Pane, MD;  Location: [facility] CATH LAB;  Service: Cardiovascular;  Laterality: N/A;   PACEMAKER INSERTION  06/2010   PERCUTANEOUS  CORONARY STENT INTERVENTION (PCI-S) N/A 02/13/2012   Procedure: PERCUTANEOUS CORONARY STENT INTERVENTION (PCI-S);  Surgeon: Robynn Pane, MD;  Location[facility] C[facility] LAB;  Service: Cardiovascular;  Laterality: N/A;   svd     x 2   THYROIDECTOMY       OB History   No obstetric history on file.     Family History  Problem Relation Age of Onset   Diabetes Mother    Cancer Sister     Social History   Tobacco Use   Smoking status: Former    Packs/day: 0.50    Types: Cigarettes    Quit date: 09/05/1991    Years since quitting: 29.7   Smokeless tobacco: Never  Substance Use Topics   Alcohol use: No    Comment: none since 1992   Drug use: No    Home Medications Prior to Admission medications   Medication Sig Start Date End Date Taking? Authorizing Provider  acetaminophen (TYLENOL) 500 MG tablet Take 500 mg by mouth every 4 (four) hours as needed for mild pain (pain).    Yes [provider]  carvedilol (COREG) 6.25 MG tablet Take 1 tablet (6.25 mg total) by mouth 2 (two) times daily with a meal. 01/12/21  Yes Croitoru, Mihai, MD  conjugated estrogens (PREMARIN) vaginal cream Place 1 Applicatorful vaginally 3 (three) times a week. Use at bedtime on Monday, Wednesday, Friday   Yes [provider]  dorzolamidel-timolol (COSOPT) 22.3-6.8 MG/ML SOLN ophthalmic solution Place 1 drop into both eyes 2 (two) times daily.   Yes [provider]  famotidine (PEPCID) 20 MG tablet Take 20 mg by mouth 2 (two) times daily.   Yes [provider]  gabapentin (NEURONTIN) 100 MG capsule Take 1 capsule (100 mg total) by mouth 3 (three) times daily as needed (pain). 03/26/16  Yes Kathlen Mody, MD  HYDROcodone-acetaminophen (NORCO/VICODIN) 5-325 MG tablet Take 0.5-1 tablets by mouth every 6 (six) hours as needed for moderate pain. 02/09/21  Yes Upstill, Melvenia Beam, PA-C  hydrocortisone (ANUSOL-HC) 2.5 % rectal cream Place 1 application rectally 2 (two) times daily as needed for  hemorrhoids or itching.   Yes [provider]  nitroGLYCERIN (NITRODUR - DOSED IN MG/24 HR) 0.4 mg/hr patch Place 0.4 mg onto the skin every morning. REMOVE EVERY NIGHT   Yes [provider]  omeprazole (PRILOSEC) 20 MG capsule Take 20 mg by mouth in the morning. 30 minutes before breakfast 08/18/20  Yes [provider]  ramipril (ALTACE) 10 MG capsule Take 10 mg by mouth daily.   Yes [provider]  Tafluprost 0.0015 % SOLN Place 1 drop into both eyes at bedtime.    Yes [provider]  torsemide (DEMADEX) 20 MG tablet Take 20 mg by mouth daily.   Yes [provider]    Allergies  Influenza vac split [influenza virus vaccine], Tramadol, and Shellfish allergy  Review of Systems   Review of Systems  Unable to perform ROS: Mental status change   Physical Exam Updated Vital Signs BP (!) 174/113   Pulse 70   Temp 97.9 F (36.6 C) (Rectal)   Resp 14   SpO2 100%   Physical Exam Vitals and nursing note reviewed.  Constitutional:      General: She is not in acute distress.    Appearance: Normal appearance. She is not diaphoretic.  HENT:     Head: Normocephalic.     Mouth/Throat:     Mouth: Mucous membranes are moist.  Cardiovascular:     Rate and Rhythm: Normal rate.  Pulmonary:     Effort: Pulmonary effort is normal. No respiratory distress.  Abdominal:     Palpations: Abdomen is soft.     Tenderness: There is no abdominal tenderness.  Musculoskeletal:        General: Swelling present.     Comments: Pitting edema of the bilateral lower extremities  Skin:    General: Skin is warm.  Neurological:     Mental Status: She is alert.     Comments: Altered, very hard of hearing, does not follow commands, repetitive sentences, at times agitated    ED Results / Procedures / Treatments   Labs (all labs ordered are listed, but only abnormal results are displayed) Labs Reviewed  CBC WITH DIFFERENTIAL/PLATELET - Abnormal;  Notable for the following components:      Result Value   WBC 3.2 (*)    Platelets 101 (*)    All other components within normal limits  BASIC METABOLIC PANEL - Abnormal; Notable for the following components:   Glucose, Bld 118 (*)    Creatinine, Ser 1.21 (*)    GFR, Estimated 40 (*)    All other components within normal limits  BRAIN NATRIURETIC PEPTIDE  URINALYSIS, ROUTINE W REFLEX MICROSCOPIC    EKG None  Radiology DG Chest Port 1 View  Result Date: 06/19/2021 CLINICAL DATA:  Shortness of breath. EXAM: PORTABLE CHEST 1 VIEW COMPARISON:  CT 01/24/2021.  Chest x-ray 01/24/2021. FINDINGS: Cardiac pacer in stable position. Severe cardiomegaly again noted. No pulmonary venous congestion. No focal infiltrate. Tiny right pleural effusion again noted. No pneumothorax. Degenerative change thoracic spine. IMPRESSION: 1. Cardiac pacer stable position. Severe cardiomegaly again noted. No pulmonary venous congestion. 2.  Tiny right pleural effusion again noted. Electronically Signed   By: Maisie Fus  Register   On: 06/10/2021 13:04   VAS [place] LOWER EXTREMITY VENOUS (DVT) ([facility] 7a-7p)  Result Date: 06/26/2021  Lower Venous DVT Study Patient Name:  [name]  Date of Exam:   06/21/2021 Medical Rec #: [id number]        Accession #:    [id number] Date of Birth: 07-30-1918        Patient Gender: F Patient Age:   89Y Exam Location:  [facility] Procedure:      VAS [place] LOWER EXTREMITY VENOUS (DVT) Referring Phys: Wayburn Shaler --------------------------------------------------------------------------------  Indications: Pain, and Swelling.  Risk Factors: Patient has pacemaker that has reached elective replacement indication and family has chosen not to replace. Limitations: Significant interstitial and pitting edema, non cooperative patient. Comparison Study: No prior study on file Performing Technologist: Sherren Kerns RVS  Examination Guidelines: A complete evaluation includes B-mode imaging,  spectral Doppler, color Doppler, and power Doppler as needed of all accessible portions of each vessel. Bilateral testing is  considered an integral part of a complete examination. Limited examinations for reoccurring indications may be performed as noted. The reflux portion of the exam is performed with the patient in reverse Trendelenburg.  +---------+---------------+---------+-----------+----------+-------------------+ RIGHT    CompressibilityPhasicitySpontaneityPropertiesThrombus Aging      +---------+---------------+---------+-----------+----------+-------------------+ CFV      Full                                         pulsatile waveform  +---------+---------------+---------+-----------+----------+-------------------+ SFJ      Full                                                             +---------+---------------+---------+-----------+----------+-------------------+ FV Prox  Full                                                             +---------+---------------+---------+-----------+----------+-------------------+ FV Mid   Full                                                             +---------+---------------+---------+-----------+----------+-------------------+ FV DistalFull                                                             +---------+---------------+---------+-----------+----------+-------------------+ PFV      Full                                         pulsatile waveform  +---------+---------------+---------+-----------+----------+-------------------+ POP      Full                                                             +---------+---------------+---------+-----------+----------+-------------------+ PTV                                                   Not well visualized +---------+---------------+---------+-----------+----------+-------------------+ PERO                                                  Not  well visualized +---------+---------------+---------+-----------+----------+-------------------+   +---------+---------------+---------+-----------+----------+-------------------+ LEFT     CompressibilityPhasicitySpontaneityPropertiesThrombus Aging      +---------+---------------+---------+-----------+----------+-------------------+  CFV      Full                                         pulsatile waveform  +---------+---------------+---------+-----------+----------+-------------------+ SFJ      Full                                                             +---------+---------------+---------+-----------+----------+-------------------+ FV Prox  Full                                                             +---------+---------------+---------+-----------+----------+-------------------+ FV Mid   Full                                                             +---------+---------------+---------+-----------+----------+-------------------+ FV DistalFull                                                             +---------+---------------+---------+-----------+----------+-------------------+ PFV      Full                                                             +---------+---------------+---------+-----------+----------+-------------------+ POP      None           No       No                                       +---------+---------------+---------+-----------+----------+-------------------+ PTV                                                   Not well visualized +---------+---------------+---------+-----------+----------+-------------------+ PERO                                                  Not well visualized +---------+---------------+---------+-----------+----------+-------------------+     Summary: RIGHT: - There is no evidence of deep vein thrombosis in the lower extremity. However, portions of this examination were limited- see  technologist comments above.  significant interstitial fluid noted throughout the extremity  LEFT: - Findings consistent with acute  deep vein thrombosis involving the left popliteal vein. Significant interstitial fluid noted throughout the extremity.  *See table(s) above for measurements and observations.    Preliminary     Procedures Procedures   Medications Ordered in ED Medications - No data to display  ED Course  I have reviewed the triage vital signs and the nursing notes.  Pertinent labs & imaging results that were available during my care of the patient were reviewed by me and considered in my medical decision making (see chart for details).    MDM Rules/Calculators/A&P                           87 year old female presents emergency department accompanied by her granddaughter for concern of leg swelling and general decline.  Vitals are stable on arrival.  She has edematous lower extremities with pitting edema.  Work-up so far is concerning for CHF exacerbation with a DVT in the left popliteal vein.  Significant subcutaneous edema prevents evaluation of the left calf veins.  Patient is not tachypneic or hypoxic in regards to possible PE.  Granddaughter is at bedside.  They are realistic about the patient's care.  They do not seem to want any further aggressive care and want to make her comfortable.  Granddaughter will talk to the family in regards to comfort measures/hospice/palliative.  Patient will be admitted.  Patients evaluation and results requires admission for further treatment and care. Patient agrees with admission plan, offers no new complaints and is stable/unchanged at time of admit.  Final Clinical Impression(s) / ED Diagnoses Final diagnoses:  None    Rx / DC Orders ED Discharge Orders     None        Rozelle Logan, DO 17-Jun-2021 [id number]

## 2021-06-02 NOTE — ED Notes (Signed)
Pt provided with gingerale and graham crackers. Patients daughter remains at the bedside.

## 2021-06-02 NOTE — ED Notes (Signed)
Attempted report 

## 2021-06-02 NOTE — H&P (Signed)
History and Physical    [name] PIR:[id number] DOB: 1918/10/30 DOA: Jun 17, 2021  PCP: Clovis Riley, L.August Saucer, MD Consultants:  cardiology: Dr. Royann Shivers  Patient coming from:  Home - lives with her granddaughter: Tilman Neat   Chief Complaint: left leg swelling and pain   HPI: [name] is a 87 y.o. female with medical history significant of atrial fibrillation, sinus node dysfunction and AV conduction disease s/p pacemaker placement, CAD s/p septal infarction and LAD stent in 2011, stent to left circumflex in 2013, HLD, HTN, CHF who presented to ED with left leg swelling and pain that started this morning around 9:15am. She lives with her granddaughter and history is obtained by her. She rubbed her leg down with alcohol and she continued to be in pain so she called EMS. She denies any fever/chills, abdominal pain N/V/D. She did say she was short of breath yesterday, but was talking comfortably. She also had some complaints of chest pain yesterday as well, but none today. Her PO intake has had a slow decline. She has also had increased hallucinations. No increased weight, cough. Increased leg swelling in left leg.   Her memory is impaired, but she is at her baseline. She is deaf and blind.   ED Course: vitals: afebrile, bp: 162/98, HR: 53-->73, RR: 11, oxygen: 94%. Pertinent labs: Bnp: 3535.9, Creatinine: 1.21, Wbc: 3.2 (baseline: 3.4-3.5), Platelets: 101 CXR: severe cardiomegaly, no pulmonary congestion. Doppler LE shows DVT in left lower extremity. Asked to admit.   Review of Systems: As per HPI; otherwise review of systems reviewed and negative.   Ambulatory Status:  wheelchair   Past Medical History:  Diagnosis Date   Anemia    Angina    on nitro-dur   Coronary artery disease    Elevated troponin    GERD (gastroesophageal reflux disease)    Glaucoma    H/O myocardial perfusion scan 07/13/2011   The post stress left ventricle is normal in size. the rest left ventricle is  normal in size. there is no scintigraphic evidence of inducible myocardial ishemia. The post-stress left ventricular function is normal. the post-stress ejection fraction is 66%.This is a low risk scan.   History of eye surgery    bilateral    HOH (hard of hearing)    Hyperlipidemia    Hypertension    Myocardial infarction (HCC)    x 3   Pacemaker    Rotator cuff tear    chronic   Shortness of breath    with exertion   SSS (sick sinus syndrome) (HCC) 07/25/2016    Past Surgical History:  Procedure Laterality Date   APPENDECTOMY     cardiac catherization  06/2010   CARDIAC CATHETERIZATION     CARDIAC CATHETERIZATION Left 07/19/2010   LV showed good LV systolic function, LVH EF of 60 % to 65 %. Left main was patent. LAD has 20 % to 30 % proximal stenosis and 99 % mid stenosis with TIMI 2 distal flow. Diagonal-1 was very, very small. Diagonal-2 was very small, Diagonal-3 was small which has 30 % to 40 % ostial stenosis just before the bifurcation with LAD stenosis. Left circumflex has 40 % to 50 % mid stenosis and 70 % to 75 % .   CORONARY ANGIOPLASTY WITH STENT PLACEMENT  02/13/2012   HYSTEROSCOPY WITH D & C  09/07/2011   Procedure: DILATATION AND CURETTAGE (D&C) /HYSTEROSCOPY;  Surgeon: Turner Daniels, MD;  Location: [facility];  Service: Gynecology;  Laterality: N/A;  uterus  INSERT / REPLACE / REMOVE PACEMAKER     LEFT HEART CATHETERIZATION WITH CORONARY ANGIOGRAM N/A 01/25/2012   Procedure: LEFT HEART CATHETERIZATION WITH CORONARY ANGIOGRAM;  Surgeon: Robynn Pane, MD;  Location: [facility]ATH [facility];  Service: Cardiovascular;  Laterality: N/A;   LEFT HEART CATHETERIZATION WITH CORONARY ANGIOGRAM N/A 12/18/2014   Procedure: LEFT HEART CATHETERIZATION WITH CORONARY ANGIOGRAM;  Surgeon: Robynn Pane, MD;  Location: [facility]ATH [facility];  Service: Cardiovascular;  Laterality: N/A;   PACEMAKER INSERTION  06/2010   PERCUTANEOUS CORONARY STENT INTERVENTION (PCI-S) N/A 02/13/2012   Procedure: PERCUTANEOUS CORONARY  STENT INTERVENTION (PCI-S);  Surgeon: Robynn Pane, MD;  Location: [facility] CATH LAB;  Service: Cardiovascular;  Laterality: N/A;   svd     x 2   THYROIDECTOMY      Social History   Socioeconomic History   Marital status: Widowed    Spouse name: Not on file   Number of children: 2   Years of education: Not on file   Highest education level: Not on file  Occupational History   Occupation: retired  Tobacco Use   Smoking status: Former    Packs/day: 0.50    Types: Cigarettes    Quit date: 09/05/1991    Years since quitting: 29.7   Smokeless tobacco: Never  Substance and Sexual Activity   Alcohol use: No    Comment: none since 1992   Drug use: No   Sexual activity: Not Currently  Other Topics Concern   Not on file  Social History Narrative   Not on file   Social Determinants of Health   Financial Resource Strain: Not on file  Food Insecurity: Not on file  Transportation Needs: Not on file  Physical Activity: Not on file  Stress: Not on file  Social Connections: Not on file  Intimate Partner Violence: Not on file    Allergies  Allergen Reactions   Influenza Vac Split [Influenza Virus Vaccine] Other (See Comments)    unknown   Tramadol Nausea And Vomiting   Shellfish Allergy Itching and Rash    shrimp    Family History  Problem Relation Age of Onset   Diabetes Mother    Cancer Sister     Prior to Admission medications   Medication Sig Start Date End Date Taking? Authorizing Provider  acetaminophen (TYLENOL) 500 MG tablet Take 500 mg by mouth every 4 (four) hours as needed for mild pain (pain).    Yes [provider]  carvedilol (COREG) 6.25 MG tablet Take 1 tablet (6.25 mg total) by mouth 2 (two) times daily with a meal. 01/12/21  Yes Croitoru, Mihai, MD  conjugated estrogens (PREMARIN) vaginal cream Place 1 Applicatorful vaginally 3 (three) times a week. Use at bedtime on Monday, Wednesday, Friday   Yes [provider]  dorzolamidel-timolol  (COSOPT) 22.3-6.8 MG/ML SOLN ophthalmic solution Place 1 drop into both eyes 2 (two) times daily.   Yes [provider]  famotidine (PEPCID) 20 MG tablet Take 20 mg by mouth 2 (two) times daily.   Yes [provider]  gabapentin (NEURONTIN) 100 MG capsule Take 1 capsule (100 mg total) by mouth 3 (three) times daily as needed (pain). 03/26/16  Yes Kathlen Mody, MD  HYDROcodone-acetaminophen (NORCO/VICODIN) 5-325 MG tablet Take 0.5-1 tablets by mouth every 6 (six) hours as needed for moderate pain. 02/09/21  Yes Upstill, Melvenia Beam, PA-C  hydrocortisone (ANUSOL-HC) 2.5 % rectal cream Place 1 application rectally 2 (two) times daily as needed for hemorrhoids or itching.  Yes [provider]  nitroGLYCERIN (NITRODUR - DOSED IN MG/24 HR) 0.4 mg/hr patch Place 0.4 mg onto the skin every morning. REMOVE EVERY NIGHT   Yes [provider]  omeprazole (PRILOSEC) 20 MG capsule Take 20 mg by mouth in the morning. 30 minutes before breakfast 08/18/20  Yes [provider]  ramipril (ALTACE) 10 MG capsule Take 10 mg by mouth daily.   Yes [provider]  Tafluprost 0.0015 % SOLN Place 1 drop into both eyes at bedtime.    Yes [provider]  torsemide (DEMADEX) 20 MG tablet Take 20 mg by mouth daily.   Yes [provider]    Physical Exam: Vitals:   06/25/2021 1330 06/23/2021 1400 06/22/2021 1430 06/28/2021 1711  BP: (!) 184/116 (!) 174/113 (!) 182/119   Pulse:  70 79   Resp: Temp:      TempSrc:      SpO2:  100% 93%   Weight:    45.9 kg     General:  Appears calm and comfortable and is in NAD. Cachetic  Eyes:  blind, EOMI, normal lids, iris ENT:  extremely hard of hearing, lips & tongue, dry mucous membranes  Neck:  no LAD, masses or thyromegaly; no carotid bruits Cardiovascular:  RRR, no m/r/g. Bilateral LE edema. L>R.  Respiratory:   CTA bilaterally with no wheezes/rales/rhonchi.  Normal respiratory effort. Limited exam  Abdomen:   soft, NT, ND, NABS Back:   normal alignment, no CVAT Skin:  no rash or induration seen on limited exam Musculoskeletal:  grossly normal tone BUE/BLE, good ROM, no bony abnormality Lower extremity:  Limited foot exam with no ulcerations.  2+ distal pulses. Psychiatric:  confused and disoriented. Keeps calling her granddaughter a different person. Some hallucinations.  Neurologic:  could not test    Radiological Exams on Admission: Independently reviewed - see discussion in A/P where applicable  DG Chest Port 1 View  Result Date: 06/24/2021 CLINICAL DATA:  Shortness of breath. EXAM: PORTABLE CHEST 1 VIEW COMPARISON:  CT 01/24/2021.  Chest x-ray 01/24/2021. FINDINGS: Cardiac pacer in stable position. Severe cardiomegaly again noted. No pulmonary venous congestion. No focal infiltrate. Tiny right pleural effusion again noted. No pneumothorax. Degenerative change thoracic spine. IMPRESSION: 1. Cardiac pacer stable position. Severe cardiomegaly again noted. No pulmonary venous congestion. 2.  Tiny right pleural effusion again noted. Electronically Signed   By: Maisie Fus  Register   On: 06/10/2021 13:04   VAS [place] LOWER EXTREMITY VENOUS (DVT) ([facility] 7a-7p)  Result Date: 06/16/2021  Lower Venous DVT Study Patient Name:  DANILA EDDIE  Date of Exam:   06/25/2021 Medical Rec #: [id number]        Accession #:    [id number] Date of Birth: 1917/12/08        Patient Gender: F Patient Age:   89Y Exam Location:  [facility] Procedure:      VAS [place] LOWER EXTREMITY VENOUS (DVT) Referring Phys: KRISTIE HORTON --------------------------------------------------------------------------------  Indications: Pain, and Swelling.  Risk Factors: Patient has pacemaker that has reached elective replacement indication and family has chosen not to replace. Limitations: Significant interstitial and pitting edema, non cooperative patient. Comparison Study: No prior study on file Performing Technologist: Sherren Kerns RVS   Examination Guidelines: A complete evaluation includes B-mode imaging, spectral Doppler, color Doppler, and power Doppler as needed of all accessible portions of each vessel. Bilateral testing is considered an integral part of a complete examination. Limited examinations for  reoccurring indications may be performed as noted. The reflux portion of the exam is performed with the patient in reverse Trendelenburg.  +---------+---------------+---------+-----------+----------+-------------------+ RIGHT    CompressibilityPhasicitySpontaneityPropertiesThrombus Aging      +---------+---------------+---------+-----------+----------+-------------------+ CFV      Full                                         pulsatile waveform  +---------+---------------+---------+-----------+----------+-------------------+ SFJ      Full                                                             +---------+---------------+---------+-----------+----------+-------------------+ FV Prox  Full                                                             +---------+---------------+---------+-----------+----------+-------------------+ FV Mid   Full                                                             +---------+---------------+---------+-----------+----------+-------------------+ FV DistalFull                                                             +---------+---------------+---------+-----------+----------+-------------------+ PFV      Full                                         pulsatile waveform  +---------+---------------+---------+-----------+----------+-------------------+ POP      Full                                                             +---------+---------------+---------+-----------+----------+-------------------+ PTV                                                   Not well visualized  +---------+---------------+---------+-----------+----------+-------------------+ PERO                                                  Not well visualized +---------+---------------+---------+-----------+----------+-------------------+   +---------+---------------+---------+-----------+----------+-------------------+ LEFT     CompressibilityPhasicitySpontaneityPropertiesThrombus Aging      +---------+---------------+---------+-----------+----------+-------------------+ CFV      Full  pulsatile waveform  +---------+---------------+---------+-----------+----------+-------------------+ SFJ      Full                                                             +---------+---------------+---------+-----------+----------+-------------------+ FV Prox  Full                                                             +---------+---------------+---------+-----------+----------+-------------------+ FV Mid   Full                                                             +---------+---------------+---------+-----------+----------+-------------------+ FV DistalFull                                                             +---------+---------------+---------+-----------+----------+-------------------+ PFV      Full                                                             +---------+---------------+---------+-----------+----------+-------------------+ POP      None           No       No                                       +---------+---------------+---------+-----------+----------+-------------------+ PTV                                                   Not well visualized +---------+---------------+---------+-----------+----------+-------------------+ PERO                                                  Not well visualized +---------+---------------+---------+-----------+----------+-------------------+      Summary: RIGHT: - There is no evidence of deep vein thrombosis in the lower extremity. However, portions of this examination were limited- see technologist comments above.  significant interstitial fluid noted throughout the extremity  LEFT: - Findings consistent with acute deep vein thrombosis involving the left popliteal vein. Significant interstitial fluid noted throughout the extremity.  *See table(s) above for measurements and observations. Electronically signed by Lemar Livings MD on 06/09/2021 at 3:49:35 PM.    Final      Labs on Admission: I  have personally reviewed the available labs and imaging studies at the time of the admission.  Pertinent labs:  Bnp: 3535.9 Creatinine: 1.21 Wbc: 3.2 (baseline: 3.4-3.5) Platelets: 101 CXR: severe cardiomegaly, no pulmonary congestion.   Assessment/Plan Principal Problem:   DVT (deep venous thrombosis) (HCC) -had discussion with her granddaughter about full comfort care. She would like to go ahead and anticoagulate her today and they will discuss further with family. I have ordered lovenox per pharmacy, platelets are 101 so will need to watch this.  -palliative care consulted and can discuss further plans tomorrow with family. Otherwise would like comfort care.   Active Problems:   AKI (acute kidney injury) (HCC) -has had worsening PO intake over the past week or so. Likely a pre renal picture. Holding nephrotoxic drugs (ACE-I and diuretic).  -saline lock with possible CHF  -PO intake as tolerated -repeat bmp in AM    Elevated brain natriuretic peptide (BNP) level -no signs of overload on CXR or exam. Could be from clot/renal +cardiac -family would like more comfort care -will check intake/output and daily weights -diet as tolerated    Paroxysmal atrial fibrillation (HCC) -not anticoagulated by her cardiologist due to age and poor function status.  -pacemaker reprogrammed on 10/2019 to provide AV synchrony -continue tele unless we make her  full comfort care.  -continue her beta blocker     CAD (coronary artery disease) -septal infarction and LAD stent in 2011, stent to left circumflex in 2013, -no chest pain, stable    HTN (hypertension) -has been elevated. -continue her beta blocker. Holding her altace in light of AKI -continue to monitor.     Pacemaker Secondary to SSS/2nd degree AV block -needed a generator change, but family opted to not do this and go with a more palliative care approach.   Thrombocytopenia -watch closely if stays on lovenox -repeat in AM   Body mass index is 20.44 kg/m.     Level of care: Telemetry Cardiac DVT prophylaxis:   VTE tx with lovenox.  Code Status: DNR confirmed with family  Family Communication: granddaughter, Tilman Neat present  Disposition Plan:  The patient is from: home  Anticipated d/c is to: SNF vs. Hospice  Anticipated d/c date will depend on clinical response to treatment as well as placement needs/comfort care. Requires inpatient hospitalization and is at significant risk of decompensation and requires constant monitoring, assessment and treatment.  Patient is currently: acutely ill Consults called: palliative care/social work  Admission status:  observation    Orland Mustard MD [facility]   How to contact the [facility] Attending or Consulting provider 7A - 7P or covering provider during after hours 7P -7A, for this patient?  Check the care team in [facility] and look for a) attending/consulting [facility] provider listed and b) the [facility] team listed Log into [email or web address] and use [place]'s universal password to access. If you do not have the password, please contact the hospital operator. Locate the [facility] provider you are looking for under [facility] and page to a number that you can be directly reached. If you still have difficulty reaching the provider, please page the [facility] (Director on Call) for the Hospitalists listed on amion for assistance.   06/21/2021, 5:22  PM

## 2021-06-02 NOTE — ED Triage Notes (Signed)
Patient presents to ed via [facility] from home , states per family patients legs are swollen more than normal. States she walks with walker and assist. However hasn't been able to use walker in several days, Patient had 4plus  pitting edema to bilateral ext. Patient is HOH and visually impaired.

## 2021-06-02 NOTE — Consult Note (Signed)
Consultation Note Date: 06/23/2021   Patient Name: [name]  DOB: [birth year]  MRN: [id number]  Age / Sex: 87 y.o., female  PCP: Clovis Riley, L.August Saucer, MD Referring Physician: Orland Mustard, MD  Reason for Consultation: Establishing goals of care, "comfort care"  HPI/Patient Profile: 87 y.o. female  with past medical history of heart failure, atrial fibrillation, sinus node dysfunction and AV conduction disease s/p pacemaker placement, CAD s/p stent x 2 (2011 and 2013), HTN, and HLD. She presented to the emergency department on 06/27/2021 with left leg swelling and pain that started today. In the ED, labs are significant for creatinine of 1.21 and BNP of 3500. Vascular ultrasound with findings consistent with acute DVT of the left popliteal vein. Chest x-ray shows severe cardiomegaly but no pulmonary congestion. Patient admitted to [facility] with acute DVT, AKI, and elevated BNP.   Clinical Assessment and Goals of Care: I have reviewed medical records including EPIC notes, labs and imaging, and went to see the patient in the ED. She is confused/agitated - she has pulled off her cardiac monitor leads and has been trying to get OOB.   Her granddaughter [name] is at bedside. We walked to the ED consult room  to discuss diagnosis, prognosis, GOC, EOL wishes, disposition, and options.  I introduced Palliative Medicine as specialized medical care for people living with serious illness. It focuses on providing relief from the symptoms and stress of a serious illness.   We discussed a brief life review of the patient. She has lived in [place] most of her life. She has been widowed for over 40 years. She had 2 children; sadly they are both deceased. She worked for [employer] in [employer]. [name] shares that Mrs. Wrenn was a excellent cook and "loved to entertain people".   Mrs. Rudy has lived  with [name] for 2 years. As far as functional and nutritional status, there has been a significant decline over the past 3 weeks. She has not been able to walk and her oral intake has decreased. Discussed that this decline was consistent with approaching end of life.   We discussed her current illness and what it means in the larger context of her ongoing co-morbidities.  Natural trajectory at EOL was discussed.  The difference between full scope medical intervention and comfort care was considered.  Reviewed the concept of a comfort path, emphasizing this means stopping (or not starting) full scope medical interventions, allowing a natural course to occur. Discussed that the goal is comfort and dignity rather than cure/prolonging life.  Introduced hospice philosophy and provided information on home vs residential hospice services.   [name] confirms that the goal of care is comfort. Discussed comfort care provided in the hospital, and what that would look like--keeping her clean and dry, no labs, no artificial hydration or feeding, no antibiotics, minimizing of medications, no cardiac monitoring. Also discussed symptom management at EOL, including medication for pain, dyspnea, and agitation.    Questions and concerns were addressed.  I provided [name] with  PMT contact card and encouraged her to call with questions or concerns.    Primary decision maker: [name] (granddaughter)    SUMMARY OF RECOMMENDATIONS   Full comfort measures  DNR/DNI as previously documented Continue lovenox for now - will discuss with granddaughter tomorrow PRN medications are available for symptom management Unrestricted visitation  PMT will continue to follow   Symptom Management:  Morphine prn for pain or dyspnea Lorazepam (ATIVAN) prn for anxiety Haloperidol (HALDOL) prn for agitation  Glycopyrrolate (ROBINUL) for excessive secretions Ondansetron (ZOFRAN) prn for nausea Polyvinyl alcohol (LIQUIFILM TEARS) prn  for dry eyes Antiseptic oral rinse (BIOTENE) prn for dry mouth   Code Status/Advance Care Planning: DNR  Palliative Prophylaxis:  Frequent Pain Assessment and Turn Reposition  Additional Recommendations (Limitations, Scope, Preferences): Full Comfort Care  Psycho-social/Spiritual:  Created space and opportunity for family to express thoughts and feelings regarding patient's current medical situation.  Emotional support provided   Prognosis:  < 2 weeks  Discharge Planning: To Be Determined      Primary Diagnoses: Present on Admission:  CAD (coronary artery disease)  HTN (hypertension)  Pacemaker  Hyperlipidemia  Paroxysmal atrial fibrillation (HCC)  DVT (deep venous thrombosis) (HCC)  Elevated brain natriuretic peptide (BNP) level  AKI (acute kidney injury) (HCC)   I have reviewed the medical record, interviewed the patient and family, and examined the patient. The following aspects are pertinent.  Past Medical History:  Diagnosis Date   Anemia    Angina    on nitro-dur   Coronary artery disease    Elevated troponin    GERD (gastroesophageal reflux disease)    Glaucoma    H/O myocardial perfusion scan 07/13/2011   The post stress left ventricle is normal in size. the rest left ventricle is normal in size. there is no scintigraphic evidence of inducible myocardial ishemia. The post-stress left ventricular function is normal. the post-stress ejection fraction is 66%.This is a low risk scan.   History of eye surgery    bilateral    HOH (hard of hearing)    Hyperlipidemia    Hypertension    Myocardial infarction (HCC)    x 3   Pacemaker    Rotator cuff tear    chronic   Shortness of breath    with exertion   SSS (sick sinus syndrome) (HCC) 07/25/2016     Family History  Problem Relation Age of Onset   Diabetes Mother    Cancer Sister    Scheduled Meds:  carvedilol  6.25 mg Oral BID WC   dorzolamidel-timolol  1 drop Both Eyes BID   enoxaparin (LOVENOX)  injection  45 mg Subcutaneous Q24H   [START ON 06/03/2021] famotidine  20 mg Oral Daily   latanoprost  1 drop Both Eyes QHS   [START ON 06/03/2021] nitroGLYCERIN  0.4 mg Transdermal q morning   [START ON 06/03/2021] pantoprazole  40 mg Oral Daily   sodium chloride flush  3 mL Intravenous Q12H   Continuous Infusions:  sodium chloride     PRN Meds:.sodium chloride, acetaminophen **OR** acetaminophen, haloperidol **OR** haloperidol **OR** haloperidol lactate, hydrocortisone, morphine injection, oxyCODONE, sodium chloride flush   Allergies  Allergen Reactions   Influenza Vac Split [Influenza Virus Vaccine] Other (See Comments)    unknown   Tramadol Nausea And Vomiting   Shellfish Allergy Itching and Rash    shrimp   Review of Systems  Unable to perform ROS: Mental status change   Physical Exam Constitutional:  Appearance: She is ill-appearing.  Pulmonary:     Effort: Pulmonary effort is normal.  Neurological:     Mental Status: She is confused.  Psychiatric:        Behavior: Behavior is agitated.    Vital Signs: BP (!) 174/115   Pulse (!) 28   Temp 97.9 F (36.6 C) (Rectal)   Resp (!) 21   Wt 45.9 kg   SpO2 95%   BMI 20.44 kg/m  Pain Scale: 0-10   Pain Score: Asleep   SpO2: SpO2: 95 % O2 Device:SpO2: 95 % O2 Flow Rate: .O2 Flow Rate (L/min): 3 L/min  IO: Intake/output summary: No intake or output data in the 24 hours ending 06/18/2021 1900  LBM:   Baseline Weight: Weight: 45.9 kg Most recent weight: Weight: 45.9 kg      Palliative Assessment/Data: PPS 20%     Time In: 1830 Time Out: 1925 Time Total: 55 minutes Greater than 50%  of this time was spent counseling and coordinating care related to the above assessment and plan.  Signed by: Merry Proud, NP   Please contact Palliative Medicine Team phone at [phone] for questions and concerns.  For individual provider: See Loretha Stapler

## 2021-06-02 NOTE — Progress Notes (Signed)
VASCULAR LAB    Bilateral lower extremity has been performed.  See CV proc for preliminary results.  Gave verbal results to Dr. Wilkie Aye.   Jasmond River, RVT 06/29/2021, 12:32 PM

## 2021-06-02 NOTE — Plan of Care (Signed)
  Problem: Clinical Measurements: Goal: Respiratory complications will improve Outcome: Progressing   Problem: Safety: Goal: Ability to remain free from injury will improve Outcome: Progressing   

## 2021-06-03 ENCOUNTER — Encounter (HOSPITAL_COMMUNITY): Payer: Self-pay | Admitting: Family Medicine

## 2021-06-03 DIAGNOSIS — I2510 Atherosclerotic heart disease of native coronary artery without angina pectoris: Secondary | ICD-10-CM | POA: Diagnosis present

## 2021-06-03 DIAGNOSIS — R638 Other symptoms and signs concerning food and fluid intake: Secondary | ICD-10-CM | POA: Diagnosis not present

## 2021-06-03 DIAGNOSIS — Z955 Presence of coronary angioplasty implant and graft: Secondary | ICD-10-CM | POA: Diagnosis not present

## 2021-06-03 DIAGNOSIS — Z950 Presence of cardiac pacemaker: Secondary | ICD-10-CM | POA: Diagnosis not present

## 2021-06-03 DIAGNOSIS — Z66 Do not resuscitate: Secondary | ICD-10-CM | POA: Diagnosis present

## 2021-06-03 DIAGNOSIS — I82402 Acute embolism and thrombosis of unspecified deep veins of left lower extremity: Secondary | ICD-10-CM | POA: Diagnosis not present

## 2021-06-03 DIAGNOSIS — I5033 Acute on chronic diastolic (congestive) heart failure: Secondary | ICD-10-CM | POA: Diagnosis present

## 2021-06-03 DIAGNOSIS — Z833 Family history of diabetes mellitus: Secondary | ICD-10-CM | POA: Diagnosis not present

## 2021-06-03 DIAGNOSIS — I480 Paroxysmal atrial fibrillation: Secondary | ICD-10-CM | POA: Diagnosis present

## 2021-06-03 DIAGNOSIS — R64 Cachexia: Secondary | ICD-10-CM | POA: Diagnosis present

## 2021-06-03 DIAGNOSIS — D6959 Other secondary thrombocytopenia: Secondary | ICD-10-CM | POA: Diagnosis present

## 2021-06-03 DIAGNOSIS — I82432 Acute embolism and thrombosis of left popliteal vein: Secondary | ICD-10-CM | POA: Diagnosis present

## 2021-06-03 DIAGNOSIS — Z515 Encounter for palliative care: Secondary | ICD-10-CM | POA: Diagnosis not present

## 2021-06-03 DIAGNOSIS — E890 Postprocedural hypothyroidism: Secondary | ICD-10-CM | POA: Diagnosis present

## 2021-06-03 DIAGNOSIS — N179 Acute kidney failure, unspecified: Secondary | ICD-10-CM | POA: Diagnosis present

## 2021-06-03 DIAGNOSIS — E785 Hyperlipidemia, unspecified: Secondary | ICD-10-CM | POA: Diagnosis present

## 2021-06-03 DIAGNOSIS — Z91013 Allergy to seafood: Secondary | ICD-10-CM | POA: Diagnosis not present

## 2021-06-03 DIAGNOSIS — K219 Gastro-esophageal reflux disease without esophagitis: Secondary | ICD-10-CM | POA: Diagnosis present

## 2021-06-03 DIAGNOSIS — I495 Sick sinus syndrome: Secondary | ICD-10-CM | POA: Diagnosis present

## 2021-06-03 DIAGNOSIS — I509 Heart failure, unspecified: Secondary | ICD-10-CM | POA: Diagnosis not present

## 2021-06-03 DIAGNOSIS — R531 Weakness: Secondary | ICD-10-CM | POA: Diagnosis present

## 2021-06-03 DIAGNOSIS — Z87891 Personal history of nicotine dependence: Secondary | ICD-10-CM | POA: Diagnosis not present

## 2021-06-03 DIAGNOSIS — I252 Old myocardial infarction: Secondary | ICD-10-CM | POA: Diagnosis not present

## 2021-06-03 DIAGNOSIS — I441 Atrioventricular block, second degree: Secondary | ICD-10-CM | POA: Diagnosis present

## 2021-06-03 DIAGNOSIS — Z6820 Body mass index (BMI) 20.0-20.9, adult: Secondary | ICD-10-CM | POA: Diagnosis not present

## 2021-06-03 DIAGNOSIS — Z887 Allergy status to serum and vaccine status: Secondary | ICD-10-CM | POA: Diagnosis not present

## 2021-06-03 DIAGNOSIS — I110 Hypertensive heart disease with heart failure: Secondary | ICD-10-CM | POA: Diagnosis present

## 2021-06-03 DIAGNOSIS — F0390 Unspecified dementia without behavioral disturbance: Secondary | ICD-10-CM | POA: Diagnosis present

## 2021-06-03 MED ORDER — HYDRALAZINE HCL 20 MG/ML IJ SOLN
5.0000 mg | Freq: Once | INTRAMUSCULAR | Status: AC
  Administered 2021-06-03: 5 mg via INTRAVENOUS
  Filled 2021-06-03: qty 1

## 2021-06-03 NOTE — Progress Notes (Signed)
Nutrition Brief Note  Chart reviewed. Pt now transitioning to comfort care.  No further nutrition interventions planned at this time.  Please re-consult as needed.   [name], RD, LDN, CDCES Registered Dietitian II Certified Diabetes Care and Education Specialist Please refer to AMION for RD and/or RD on-call/weekend/after hours pager   

## 2021-06-03 NOTE — Progress Notes (Signed)
Daily Progress Note   Patient Name: [name]       Date: 06/03/2021 DOB: 02/19/1918  Age: 87 y.o. MRN#: [id number] Attending Physician: Lurene Shadow, MD Primary Care Physician: Asencion Gowda.August Saucer, MD Admit Date: 06/12/21  Reason for Consultation/Follow-up: end of life care, symptom management, disposition  Subjective: Patient appears to be resting comfortably, I did not attempt to wake her up. Bedside RN reports that patient has very minimal oral intake. Today she had 2 bites of her lunch and a few sips of water.   I spoke with granddaughter [name] by phone. Discussed stopping the lovenox injection as this may be causing discomfort and is not really helping her at this point.  Also discussed transfer to hospice facility more a more peaceful EOL transition. [name] agrees and would prefer [facility].   Length of Stay: 0  Current Medications: Scheduled Meds:  . dorzolamide-timolol  1 drop Both Eyes BID  . hydrALAZINE  5 mg Intravenous Once  . latanoprost  1 drop Both Eyes QHS  . nitroGLYCERIN  0.4 mg Transdermal q morning  . pantoprazole  40 mg Oral Daily  . sodium chloride flush  3 mL Intravenous Q12H     PRN Meds: sodium chloride, acetaminophen **OR** acetaminophen, antiseptic oral rinse, glycopyrrolate **OR** glycopyrrolate **OR** glycopyrrolate, haloperidol **OR** haloperidol **OR** haloperidol lactate, hydrocortisone, LORazepam **OR** LORazepam **OR** LORazepam, morphine injection, ondansetron **OR** ondansetron (ZOFRAN) IV, oxyCODONE, polyvinyl alcohol, sodium chloride flush          Vital Signs: BP (!) 177/111 (BP Location: Right Arm)   Pulse 69   Temp 97.7 F (36.5 C) (Oral)   Resp 15   Ht 4\' 11"  (1.499 m)   Wt 45.7 kg   SpO2 100%   BMI 20.35 kg/m  SpO2: SpO2:  100 % O2 Device: O2 Device: Room Air   Intake/output summary:  Intake/Output Summary (Last 24 hours) at 06/03/2021 1910 Last data filed at 2021/06/12 2100 Gross per 24 hour  Intake 120 ml  Output --  Net 120 ml   LBM: Last BM Date:  (Pt. unable to give date) Baseline Weight: Weight: 45.9 kg Most recent weight: Weight: 45.7 kg       Palliative Assessment/Data: PPS 20%     Palliative Care Assessment & Plan   HPI/Patient Profile: 87 y.o. female  with past medical history of heart failure, atrial fibrillation, sinus node dysfunction and AV conduction disease s/p pacemaker placement, CAD s/p stent x 2 (2011 and 2013), HTN, and HLD. She presented to the emergency department on 06/19/2021 with left leg swelling and pain that started today. In the ED, labs are significant for creatinine of 1.21 and BNP of 3500. Vascular ultrasound with findings consistent with acute DVT of the left popliteal vein. Chest x-ray shows severe cardiomegaly but no pulmonary congestion. Patient admitted to [facility] with acute DVT, AKI, and elevated BNP.    Assessment: - DVT left leg - AKI - functional decline - decreased oral intake - end of life care  Recommendations/Plan: Continue full comfort care PRN medications are available for symptom management at EOL Transfer to [facility] when bed is available - TOC notified PMT will continue to follow and support  Goals of Care and Additional Recommendations: Limitations on Scope of Treatment: Full Comfort Care  Code Status: DNR/DNI  Prognosis:  < 2 weeks  Discharge Planning: Hospice facility   Thank you for allowing the Palliative Medicine Team to assist in the care of this patient.   Total Time 25 minute Prolonged Time Billed  no       Greater than 50%  of this time was spent counseling and coordinating care related to the above assessment and plan.  Merry Proud, NP  Please contact Palliative Medicine Team phone at [phone] for questions and  concerns.

## 2021-06-03 NOTE — Plan of Care (Signed)
  Problem: Clinical Measurements: Goal: Respiratory complications will improve Outcome: Progressing   Problem: Safety: Goal: Ability to remain free from injury will improve Outcome: Progressing   

## 2021-06-03 NOTE — Progress Notes (Addendum)
Progress Note    CHEVELLA [name]  MVH:[id number] DOB: Nov 17, 1917  DOA: 14-Jun-2021 PCP: Clovis Riley, L.August Saucer, MD      Brief Narrative:    Medical records reviewed and are as summarized below:  [name] is a 87 y.o. female  with medical history significant of atrial fibrillation, sinus node dysfunction and AV conduction disease s/p pacemaker placement, CAD s/p septal infarction and LAD stent in 2011, stent to left circumflex in 2013, hypertension, hyperlipidemia, chronic diastolic CHF, cognitive impairment with suspected dementia, who was brought to the hospital because of pain and swelling in the left leg.  Work-up reviewed acute left popliteal DVT, AKI and acute on chronic diastolic CHF.  She was treated with Lovenox.      Assessment/Plan:   Principal Problem:   DVT (deep venous thrombosis) (HCC) Active Problems:   CAD (coronary artery disease)   HTN (hypertension)   Pacemaker   Hyperlipidemia   Paroxysmal atrial fibrillation (HCC)   Elevated brain natriuretic peptide (BNP) level   AKI (acute kidney injury) (HCC)   Body mass index is 20.35 kg/m.   Acute DVT left popliteal vein Acute kidney injury Acute on chronic diastolic CHF with anasarca (BNP- 3,535) CAD with coronary stent Sinus node dysfunction s/p permanent pacemaker placement Hypertension Cognitive impairment with suspected dementia    PLAN  Goals of care were discussed with [name], granddaughter.  She confirmed that she wants patient to be on comfort measures. Discontinue Lovenox and carvedilol. Patient has been transitioned to comfort measures. Follow-up with palliative care team. Consult hospice team to assist with disposition.     Diet Order             Diet regular Room service appropriate? Yes; Fluid consistency: Thin  Diet effective now                      Consultants: Palliative care and hospice team  Procedures: None    Medications:    dorzolamide-timolol  1  drop Both Eyes BID   latanoprost  1 drop Both Eyes QHS   nitroGLYCERIN  0.4 mg Transdermal q morning   pantoprazole  40 mg Oral Daily   sodium chloride flush  3 mL Intravenous Q12H   Continuous Infusions:  sodium chloride       Anti-infectives (From admission, onward)    None              Family Communication/Anticipated D/C date and plan/Code Status   DVT prophylaxis:      Code Status: DNR  Family Communication: Discussed plan of care with Betty, granddaughter, at the bedside Disposition Plan:    Status is: Observation  The patient will require care spanning > 2 midnights and should be moved to inpatient because: IV treatments appropriate due to intensity of illness or inability to take PO and comfort measures  Dispo: The patient is from: Home              Anticipated d/c is to:  Hospice house              Patient currently is not medically stable to d/c.   Difficult to place patient No           Subjective:   Interval events noted.  She is confused and unable to provide any history.  Her granddaughter, [name], was at the bedside.  Objective:    Vitals:   06-14-2021 1711 06/14/21 1800 14-Jun-2021 2010 06-14-2021 2105  BP:  (!) 174/115 (!) 166/111 (!) 188/116  Pulse:  (!) 28 79 69  Resp:  (!) 21 15 20   Temp:   (!) 97.5 F (36.4 C) 97.7 F (36.5 C)  TempSrc:   Oral Oral  SpO2:  95% 100% 95%  Weight: 45.9 kg  47.6 kg 45.7 kg  Height:   4\' 11"  (1.499 m) 4\' 11"  (1.499 m)   No data found.   Intake/Output Summary (Last 24 hours) at 06/03/2021 1355 Last data filed at 06/10/2021 2100 Gross per 24 hour  Intake 120 ml  Output --  Net 120 ml   Filed Weights   06/23/2021 1711 06/04/2021 2010 06/14/2021 2105  Weight: 45.9 kg 47.6 kg 45.7 kg    Exam:  GEN: NAD SKIN: Warm and dry EYES: No pallor or icterus ENT: MMM CV: RRR PULM: CTA B ABD: soft, distended, NT, +BS CNS: Alert and oriented x1 (person).  She moves all extremities spontaneously. EXT:  Bilateral lower extremity edema from the thighs to the feet.        Data Reviewed:   I have personally reviewed following labs and imaging studies:  Labs: Labs show the following:   Basic Metabolic Panel: Recent Labs  Lab 06/17/2021 1320  NA 142  K 4.5  CL 106  CO2 27  GLUCOSE 118*  BUN 19  CREATININE 1.21*  CALCIUM 8.9   GFR Estimated Creatinine Clearance: 16.9 mL/min (A) (by C-G formula based on SCr of 1.21 mg/dL (H)). Liver Function Tests: No results for input(s): AST, ALT, ALKPHOS, BILITOT, PROT, ALBUMIN in the last 168 hours. No results for input(s): LIPASE, AMYLASE in the last 168 hours. No results for input(s): AMMONIA in the last 168 hours. Coagulation profile No results for input(s): INR, PROTIME in the last 168 hours.  CBC: Recent Labs  Lab 06/20/2021 1320  WBC 3.2*  NEUTROABS 2.2  HGB 14.4  HCT 44.5  MCV 99.1  PLT 101*   Cardiac Enzymes: No results for input(s): CKTOTAL, CKMB, CKMBINDEX, TROPONINI in the last 168 hours. BNP (last 3 results) No results for input(s): PROBNP in the last 8760 hours. CBG: No results for input(s): GLUCAP in the last 168 hours. D-Dimer: No results for input(s): DDIMER in the last 72 hours. Hgb A1c: No results for input(s): HGBA1C in the last 72 hours. Lipid Profile: No results for input(s): CHOL, HDL, LDLCALC, TRIG, CHOLHDL, LDLDIRECT in the last 72 hours. Thyroid function studies: No results for input(s): TSH, T4TOTAL, T3FREE, THYROIDAB in the last 72 hours.  Invalid input(s): FREET3 Anemia work up: No results for input(s): VITAMINB12, FOLATE, FERRITIN, TIBC, IRON, RETICCTPCT in the last 72 hours. Sepsis Labs: Recent Labs  Lab 06/21/2021 1320  WBC 3.2*    Microbiology No results found for this or any previous visit (from the past 240 hour(s)).  Procedures and diagnostic studies:  DG Chest Port 1 View  Result Date: 06/07/2021 CLINICAL DATA:  Shortness of breath. EXAM: PORTABLE CHEST 1 VIEW COMPARISON:  CT  01/24/2021.  Chest x-ray 01/24/2021. FINDINGS: Cardiac pacer in stable position. Severe cardiomegaly again noted. No pulmonary venous congestion. No focal infiltrate. Tiny right pleural effusion again noted. No pneumothorax. Degenerative change thoracic spine. IMPRESSION: 1. Cardiac pacer stable position. Severe cardiomegaly again noted. No pulmonary venous congestion. 2.  Tiny right pleural effusion again noted. Electronically Signed   By: 08/02/2021  Register   On: 06/28/2021 13:04   VAS 01/26/2021 LOWER EXTREMITY VENOUS (DVT) ([facility] and [facility] 7a-7p)  Result Date: 06/22/2021  Lower  Venous DVT Study Patient Name:  BRENDY FICEK  Date of Exam:   July 02, 2021 Medical Rec #: [id number]        Accession #:    [id number] Date of Birth: 09-04-1918        Patient Gender: F Patient Age:   89Y Exam Location:  [facility] Procedure:      VAS [place] LOWER EXTREMITY VENOUS (DVT) Referring Phys: KRISTIE HORTON --------------------------------------------------------------------------------  Indications: Pain, and Swelling.  Risk Factors: Patient has pacemaker that has reached elective replacement indication and family has chosen not to replace. Limitations: Significant interstitial and pitting edema, non cooperative patient. Comparison Study: No prior study on file Performing Technologist: Sherren Kerns RVS  Examination Guidelines: A complete evaluation includes B-mode imaging, spectral Doppler, color Doppler, and power Doppler as needed of all accessible portions of each vessel. Bilateral testing is considered an integral part of a complete examination. Limited examinations for reoccurring indications may be performed as noted. The reflux portion of the exam is performed with the patient in reverse Trendelenburg.  +---------+---------------+---------+-----------+----------+-------------------+ RIGHT    CompressibilityPhasicitySpontaneityPropertiesThrombus Aging       +---------+---------------+---------+-----------+----------+-------------------+ CFV      Full                                         pulsatile waveform  +---------+---------------+---------+-----------+----------+-------------------+ SFJ      Full                                                             +---------+---------------+---------+-----------+----------+-------------------+ FV Prox  Full                                                             +---------+---------------+---------+-----------+----------+-------------------+ FV Mid   Full                                                             +---------+---------------+---------+-----------+----------+-------------------+ FV DistalFull                                                             +---------+---------------+---------+-----------+----------+-------------------+ PFV      Full                                         pulsatile waveform  +---------+---------------+---------+-----------+----------+-------------------+ POP      Full                                                             +---------+---------------+---------+-----------+----------+-------------------+  PTV                                                   Not well visualized +---------+---------------+---------+-----------+----------+-------------------+ PERO                                                  Not well visualized +---------+---------------+---------+-----------+----------+-------------------+   +---------+---------------+---------+-----------+----------+-------------------+ LEFT     CompressibilityPhasicitySpontaneityPropertiesThrombus Aging      +---------+---------------+---------+-----------+----------+-------------------+ CFV      Full                                         pulsatile waveform  +---------+---------------+---------+-----------+----------+-------------------+  SFJ      Full                                                             +---------+---------------+---------+-----------+----------+-------------------+ FV Prox  Full                                                             +---------+---------------+---------+-----------+----------+-------------------+ FV Mid   Full                                                             +---------+---------------+---------+-----------+----------+-------------------+ FV DistalFull                                                             +---------+---------------+---------+-----------+----------+-------------------+ PFV      Full                                                             +---------+---------------+---------+-----------+----------+-------------------+ POP      None           No       No                                       +---------+---------------+---------+-----------+----------+-------------------+ PTV  Not well visualized +---------+---------------+---------+-----------+----------+-------------------+ PERO                                                  Not well visualized +---------+---------------+---------+-----------+----------+-------------------+     Summary: RIGHT: - There is no evidence of deep vein thrombosis in the lower extremity. However, portions of this examination were limited- see technologist comments above.  significant interstitial fluid noted throughout the extremity  LEFT: - Findings consistent with acute deep vein thrombosis involving the left popliteal vein. Significant interstitial fluid noted throughout the extremity.  *See table(s) above for measurements and observations. Electronically signed by Lemar Livings MD on 06/06/2021 at 3:49:35 PM.    Final                LOS: 0 days   Neil Brickell  [facility]   Pager on [email or web address]. If 7PM-7AM,  please contact night-coverage at [email or web address]     06/03/2021, 1:55 PM

## 2021-06-03 NOTE — TOC Initial Note (Signed)
Transition of Care [facility]) - Initial/Assessment Note    Patient Details  Name: [name] MRN: [id number] Date of Birth: Jun 12, 1918  [facility]) CM/SW Contact:    Erin Sons, LCSW Phone Number: 06/03/2021, 2:10 PM  Clinical Narrative:                  PMT confirmed pt is CC and plan is for residential hospice. Notified they prefer [facility]. May be open to [facility] in [place] if long wait list for [place].   CSW confirmed plan with pt granddaughter. CSW made referral to [facility]. They will review pt. For appropriateness And bed availability.   Expected Discharge Plan: Hospice Medical Facility Barriers to Discharge: Hospice Bed not available   Patient Goals and CMS Choice Patient states their goals for this hospitalization and ongoing recovery are:: Family wanting [facility] hospice facility      Expected Discharge Plan and Services Expected Discharge Plan: Hospice Medical Facility                                              Prior Living Arrangements/Services              Need for Family Participation in Patient Care: Yes (Comment) Care giver support system in place?: No (comment)      Activities of Daily Living Home Assistive Devices/Equipment: Bedside commode/3-in-1, Walker (specify type), Wheelchair, Blood pressure cuff, Oxygen, Eyeglasses, Grab bars in shower ADL Screening (condition at time of admission) Patient's cognitive ability adequate to safely complete daily activities?: No Is the patient deaf or have difficulty hearing?: Yes Does the patient have difficulty seeing, even when wearing glasses/contacts?: Yes Does the patient have difficulty concentrating, remembering, or making decisions?: Yes Patient able to express need for assistance with ADLs?: No Does the patient have difficulty dressing or bathing?: Yes Independently performs ADLs?: No Communication: Dependent Is this a change from baseline?:  Pre-admission baseline Dressing (OT): Needs assistance Is this a change from baseline?: Pre-admission baseline Grooming: Needs assistance Is this a change from baseline?: Pre-admission baseline Feeding: Needs assistance Is this a change from baseline?: Pre-admission baseline Bathing: Needs assistance Is this a change from baseline?: Pre-admission baseline Toileting: Dependent Is this a change from baseline?: Pre-admission baseline In/Out Bed: Dependent Is this a change from baseline?: Pre-admission baseline Walks in Home: Dependent Is this a change from baseline?: Pre-admission baseline Does the patient have difficulty walking or climbing stairs?: Yes Weakness of Legs: Both Weakness of Arms/Hands: None  Permission Sought/Granted                  Emotional Assessment         Alcohol / Substance Use: Not Applicable Psych Involvement: No (comment)  Admission diagnosis:  Acute exacerbation of CHF (congestive heart failure) (HCC) [I50.9] Patient Active Problem List   Diagnosis Date Noted   DVT (deep venous thrombosis) (HCC) 06/04/2021   Elevated brain natriuretic peptide (BNP) level 06/27/2021   AKI (acute kidney injury) (HCC) 06/25/2021   Impingement syndrome of left shoulder 09/15/2016   SSS (sick sinus syndrome) (HCC) 07/25/2016   Paroxysmal atrial fibrillation (HCC) 07/25/2016   Elevated troponin 03/24/2016   Left shoulder pain 03/24/2016   Rotator cuff tear    HOH (hard of hearing)    Left arm pain    Unstable angina (HCC)  11/18/2015   Chest pain 12/17/2014   CAD (coronary artery disease) 04/11/2013   HTN (hypertension) 04/11/2013   Pacemaker    Hyperlipidemia    PCP:  Clovis Riley, L.August Saucer, MD Pharmacy:   [facility] [address], [place] - [address] [place] [address] Phone: [phone] Fax: [phone]  [id number] [place], [place] - [address] AT [facility] [address] [place] [address] Phone:  [phone] Fax: [phone]     Social Determinants of Health (SDOH) Interventions    Readmission Risk Interventions No flowsheet data found.

## 2021-06-03 NOTE — Progress Notes (Signed)
Received request from [facility] for [facility]. Chart reviewed and [facility] eligibility is pending at this time. Have spoken with patient's granddaughter [name] to acknowledge referral and explain services. Unfortunately [facility] does not have a bed to offer today. TOC is aware hospital liaison will follow up tomorrow or sooner if room becomes available.   Please do not hesitate to call with any hospice related questions or concerns.   Thank you for the opportunity to participate in this patient's care.  Thea Gist, Charity fundraiser, [facility] Liaison  [phone]

## 2021-06-04 DIAGNOSIS — N179 Acute kidney failure, unspecified: Secondary | ICD-10-CM | POA: Diagnosis not present

## 2021-06-04 DIAGNOSIS — R638 Other symptoms and signs concerning food and fluid intake: Secondary | ICD-10-CM | POA: Diagnosis not present

## 2021-06-04 DIAGNOSIS — I82432 Acute embolism and thrombosis of left popliteal vein: Secondary | ICD-10-CM | POA: Diagnosis not present

## 2021-06-04 DIAGNOSIS — I5033 Acute on chronic diastolic (congestive) heart failure: Secondary | ICD-10-CM | POA: Diagnosis not present

## 2021-06-04 MED ORDER — HYDROMORPHONE BOLUS VIA INFUSION
0.2000 mg | INTRAVENOUS | Status: DC | PRN
  Administered 2021-06-05: 0.2 mg via INTRAVENOUS
  Filled 2021-06-04: qty 1

## 2021-06-04 MED ORDER — SODIUM CHLORIDE 0.9 % IV SOLN
0.2000 mg/h | INTRAVENOUS | Status: DC
  Administered 2021-06-04: 0.2 mg/h via INTRAVENOUS
  Filled 2021-06-04: qty 2.5

## 2021-06-04 MED ORDER — OXYCODONE HCL 5 MG/5ML PO SOLN: 5.0000 mg | ORAL | Status: AC

## 2021-06-04 NOTE — Progress Notes (Signed)
Daily Progress Note   Patient Name: [name]       Date: 06/04/2021 DOB: [birth year]  Age: 87 y.o. MRN#: [id number] Attending Physician: Lurene Shadow, MD Primary Care Physician: Asencion Gowda.August Saucer, MD Admit Date: 2021-06-30  Reason for Consultation/Follow-up: end of life care, symptom management  Subjective: Patient appears uncomfortable with facial grimacing noted. Granddaughter [name] at bedside reports that patient has been reporting generalized pain, which was exacerbated when patient was repositioned/cleaned by nursing staff. Per MAR, patient was given oxycodone IR at 08:35 which did not provide relief. Discussed that hydromorphone (Dilaudid) infusion was ordered by Dr. Myriam Forehand earlier this morning.   Breakfast tray at bedside - note patient ate only a few bites.   Length of Stay: 1  Current Medications:   Continuous Infusions: . sodium chloride    . HYDROmorphone      PRN Meds: sodium chloride, acetaminophen **OR** acetaminophen, antiseptic oral rinse, glycopyrrolate **OR** glycopyrrolate **OR** glycopyrrolate, haloperidol **OR** haloperidol **OR** haloperidol lactate, hydrocortisone, LORazepam **OR** LORazepam **OR** LORazepam, ondansetron **OR** ondansetron (ZOFRAN) IV, oxyCODONE, polyvinyl alcohol, sodium chloride flush        Vital Signs: BP (!) 166/110 (BP Location: Right Arm)   Pulse 70   Temp (!) 97.4 F (36.3 C) (Oral)   Resp 16   Ht 4\' 11"  (1.499 m)   Wt 45.7 kg   SpO2 100%   BMI 20.35 kg/m  SpO2: SpO2: 100 % O2 Device: O2 Device: Room Air   Intake/output summary:  Intake/Output Summary (Last 24 hours) at 06/04/2021 1112 Last data filed at 06/03/2021 2019 Gross per 24 hour  Intake 0 ml  Output --  Net 0 ml         Palliative Assessment/Data: PPS  20%     Palliative Care Assessment & Plan   HPI/Patient Profile: 87 y.o. female  with past medical history of heart failure, atrial fibrillation, sinus node dysfunction and AV conduction disease s/p pacemaker placement, CAD s/p stent x 2 (2011 and 2013), HTN, and HLD. She presented to the emergency department on 06-30-21 with left leg swelling and pain that started today. In the ED, labs are significant for creatinine of 1.21 and BNP of 3500. Vascular ultrasound with findings consistent with acute DVT of the left popliteal vein. Chest x-ray shows severe cardiomegaly but no pulmonary congestion. Patient  admitted to [facility] with acute DVT, AKI, and elevated BNP.    Assessment: - DVT left leg - AKI - functional decline - decreased oral intake - end of life care   Recommendations/Plan: Continue full comfort care Agree with starting hydromorphone (Dilaudid) infusion - added order for bolus dose via infusion PRN medications are available for symptom management at EOL Transfer to [facility] when bed is available  PMT will continue to follow and support  Goals of Care and Additional Recommendations: Limitations on Scope of Treatment: Full Comfort Care  Code Status: DNR/DNI   Prognosis:  < 2 weeks  Discharge Planning: Hospice facility  Care plan was discussed with bedside RN  Thank you for allowing the Palliative Medicine Team to assist in the care of this patient.   Total Time 20 minutes Prolonged Time Billed  no       Greater than 50%  of this time was spent counseling and coordinating care related to the above assessment and plan.  Merry Proud, NP  Please contact Palliative Medicine Team phone at [phone] for questions and concerns.

## 2021-06-04 NOTE — TOC Progression Note (Signed)
Transition of Care [facility]) - Progression Note    Patient Details  Name: [name] MRN: [id number] Date of Birth: 1918/06/24  Transition [facility] Care [facility]) CM/SW Contact  Patrice Paradise, LCSW Phone Number:[phone] 06/04/2021, 1:32 PM  Clinical Narrative:     CSW spoke with Boneta Lucks at [facility] and she stated that pt's eligibility is sill pending as well as they do not have a bed today. She stated a decision should be made later today and she would put a note in.  TOC team will continue to assist with discharge planning needs.    Expected Discharge Plan: Hospice Medical Facility Barriers to Discharge: Hospice Bed not available  Expected Discharge Plan and Services Expected Discharge Plan: Hospice Medical Facility                                               Social Determinants of Health (SDOH) Interventions    Readmission Risk Interventions No flowsheet data found.

## 2021-06-04 NOTE — Plan of Care (Signed)
  Problem: Pain Managment: Goal: General experience of comfort will improve Outcome: Progressing   

## 2021-06-04 NOTE — Progress Notes (Signed)
[facility] 3E04 Civil engineer, contracting [facility]) Hospital Liaison RN Note  [facility] eligibility confirmed.   Unfortunately, [facility] is not able to offer a room today.   Family and TOC aware hospital liaison will follow up tomorrow or sooner if a room becomes available.   Please do not hesitate to call for any hospice related questions.  Thank you,  Bobbie "Einar Gip, RN, BSN [facility] Liaison [phone]

## 2021-06-04 NOTE — Progress Notes (Addendum)
Progress Note    [name]  IPJ:[id number] DOB: [birth year]  DOA: 06/10/2021 PCP: Clovis Riley, L.August Saucer, MD      Brief Narrative:    Medical records reviewed and are as summarized below:  [name] is a 87 y.o. female  with medical history significant of atrial fibrillation, sinus node dysfunction and AV conduction disease s/p pacemaker placement, CAD s/p septal infarction and LAD stent in 2011, stent to left circumflex in 2013, hypertension, hyperlipidemia, chronic diastolic CHF, cognitive impairment with suspected dementia, who was brought to the hospital because of pain and swelling in the left leg.  Work-up reviewed acute left popliteal DVT, AKI and acute on chronic diastolic CHF.  She was treated with Lovenox.      Assessment/Plan:   Principal Problem:   DVT (deep venous thrombosis) (HCC) Active Problems:   CAD (coronary artery disease)   HTN (hypertension)   Pacemaker   Hyperlipidemia   Paroxysmal atrial fibrillation (HCC)   Elevated brain natriuretic peptide (BNP) level   AKI (acute kidney injury) (HCC)   Acute on chronic diastolic CHF (congestive heart failure) (HCC)   Body mass index is 20.35 kg/m.   Acute DVT left popliteal vein Acute kidney injury Acute on chronic diastolic CHF with anasarca (BNP- 3,535) CAD with coronary stent Sinus node dysfunction s/p permanent pacemaker placement Hypertension Cognitive impairment with suspected dementia Generalized body pain   PLAN  Change IV morphine injections to IV Dilaudid infusion for adequate pain control. Continue comfort measures Follow-up with hospice team to assist with disposition.   Diet Order             Diet regular Room service appropriate? Yes; Fluid consistency: Thin  Diet effective now                      Consultants: Palliative care and hospice team  Procedures: None    Medications:    dorzolamide-timolol  1 drop Both Eyes BID   latanoprost  1 drop Both  Eyes QHS   nitroGLYCERIN  0.4 mg Transdermal q morning   pantoprazole  40 mg Oral Daily   sodium chloride flush  3 mL Intravenous Q12H   Continuous Infusions:  sodium chloride     HYDROmorphone       Anti-infectives (From admission, onward)    None              Family Communication/Anticipated D/C date and plan/Code Status   DVT prophylaxis:      Code Status: DNR  Family Communication: None Disposition Plan:    Status is: Observation  The patient will require care spanning > 2 midnights and should be moved to inpatient because: IV treatments appropriate due to intensity of illness or inability to take PO and comfort measures  Dispo: The patient is from: Home              Anticipated d/c is to:  Hospice house              Patient currently is not medically stable to d/c.   Difficult to place patient No           Subjective:   Interval events noted.  She complains of pain all over.  She is confused and cannot provide any details.  Objective:    Vitals:   06/01/2021 2010 06/10/2021 2105 06/03/21 1906 06/03/21 1947  BP: (!) 166/111 (!) 188/116 (!) 177/111 (!) 166/110  Pulse: 79 69 69 70  Resp: Temp: (!) 97.5 F (36.4 C) 97.7 F (36.5 C)  (!) 97.4 F (36.3 C)  TempSrc: Oral Oral  Oral  SpO2: 100% 95% 100% 100%  Weight: 47.6 kg 45.7 kg    Height:  (1.499 m)  (1.499 m)     No data found.   Intake/Output Summary (Last 24 hours) at 06/04/2021 1143 Last data filed at 06/03/2021 2019 Gross per 24 hour  Intake 0 ml  Output --  Net 0 ml   Filed Weights   06/06/2021 1711 06/06/2021 2010 06/08/2021 2105  Weight: 45.9 kg 47.6 kg 45.7 kg    Exam:  GEN: NAD SKIN: Warm and dry EYES: No pallor or icterus ENT: MMM CV: RRR PULM: CTA B ABD: soft, distended, NT, +BS CNS: Alert but disoriented after EXT: Bilateral lower extremity edema from thighs to feet          Data Reviewed:   I have personally reviewed following  labs and imaging studies:  Labs: Labs show the following:   Basic Metabolic Panel: Recent Labs  Lab 06/01/2021 1320  NA 142  K 4.5  CL 106  CO2 27  GLUCOSE 118*  BUN 19  CREATININE 1.21*  CALCIUM 8.9   GFR Estimated Creatinine Clearance: 16.9 mL/min (A) (by C-G formula based on SCr of 1.21 mg/dL (H)). Liver Function Tests: No results for input(s): AST, ALT, ALKPHOS, BILITOT, PROT, ALBUMIN in the last 168 hours. No results for input(s): LIPASE, AMYLASE in the last 168 hours. No results for input(s): AMMONIA in the last 168 hours. Coagulation profile No results for input(s): INR, PROTIME in the last 168 hours.  CBC: Recent Labs  Lab 06/07/2021 1320  WBC 3.2*  NEUTROABS 2.2  HGB 14.4  HCT 44.5  MCV 99.1  PLT 101*   Cardiac Enzymes: No results for input(s): CKTOTAL, CKMB, CKMBINDEX, TROPONINI in the last 168 hours. BNP (last 3 results) No results for input(s): PROBNP in the last 8760 hours. CBG: No results for input(s): GLUCAP in the last 168 hours. D-Dimer: No results for input(s): DDIMER in the last 72 hours. Hgb A1c: No results for input(s): HGBA1C in the last 72 hours. Lipid Profile: No results for input(s): CHOL, HDL, LDLCALC, TRIG, CHOLHDL, LDLDIRECT in the last 72 hours. Thyroid function studies: No results for input(s): TSH, T4TOTAL, T3FREE, THYROIDAB in the last 72 hours.  Invalid input(s): FREET3 Anemia work up: No results for input(s): VITAMINB12, FOLATE, FERRITIN, TIBC, IRON, RETICCTPCT in the last 72 hours. Sepsis Labs: Recent Labs  Lab 06/14/2021 1320  WBC 3.2*    Microbiology No results found for this or any previous visit (from the past 240 hour(s)).  Procedures and diagnostic studies:  DG Chest Port 1 View  Result Date: 06/04/2021 CLINICAL DATA:  Shortness of breath. EXAM: PORTABLE CHEST 1 VIEW COMPARISON:  CT 01/24/2021.  Chest x-ray 01/24/2021. FINDINGS: Cardiac pacer in stable position. Severe cardiomegaly again noted. No pulmonary venous  congestion. No focal infiltrate. Tiny right pleural effusion again noted. No pneumothorax. Degenerative change thoracic spine. IMPRESSION: 1. Cardiac pacer stable position. Severe cardiomegaly again noted. No pulmonary venous congestion. 2.  Tiny right pleural effusion again noted. Electronically Signed   By: Maisie Fus  Register   On: 06/17/2021 13:04   VAS [place] LOWER EXTREMITY VENOUS (DVT) ([facility] and [facility] 7a-7p)  Result Date: 06/22/2021  Lower Venous DVT Study Patient Name:  JONATHAN KIRKENDOLL  Date of Exam:   06/09/2021 Medical Rec #: [id number]  Accession #:    [id number] Date of Birth: [birth year]        Patient Gender: F Patient Age:   89Y Exam Location:  [facility] Procedure:      VAS [place] LOWER EXTREMITY VENOUS (DVT) Referring Phys: KRISTIE HORTON --------------------------------------------------------------------------------  Indications: Pain, and Swelling.  Risk Factors: Patient has pacemaker that has reached elective replacement indication and family has chosen not to replace. Limitations: Significant interstitial and pitting edema, non cooperative patient. Comparison Study: No prior study on file Performing Technologist: Sherren Kerns RVS  Examination Guidelines: A complete evaluation includes B-mode imaging, spectral Doppler, color Doppler, and power Doppler as needed of all accessible portions of each vessel. Bilateral testing is considered an integral part of a complete examination. Limited examinations for reoccurring indications may be performed as noted. The reflux portion of the exam is performed with the patient in reverse Trendelenburg.  +---------+---------------+---------+-----------+----------+-------------------+ RIGHT    CompressibilityPhasicitySpontaneityPropertiesThrombus Aging      +---------+---------------+---------+-----------+----------+-------------------+ CFV      Full                                         pulsatile waveform   +---------+---------------+---------+-----------+----------+-------------------+ SFJ      Full                                                             +---------+---------------+---------+-----------+----------+-------------------+ FV Prox  Full                                                             +---------+---------------+---------+-----------+----------+-------------------+ FV Mid   Full                                                             +---------+---------------+---------+-----------+----------+-------------------+ FV DistalFull                                                             +---------+---------------+---------+-----------+----------+-------------------+ PFV      Full                                         pulsatile waveform  +---------+---------------+---------+-----------+----------+-------------------+ POP      Full                                                             +---------+---------------+---------+-----------+----------+-------------------+  PTV                                                   Not well visualized +---------+---------------+---------+-----------+----------+-------------------+ PERO                                                  Not well visualized +---------+---------------+---------+-----------+----------+-------------------+   +---------+---------------+---------+-----------+----------+-------------------+ LEFT     CompressibilityPhasicitySpontaneityPropertiesThrombus Aging      +---------+---------------+---------+-----------+----------+-------------------+ CFV      Full                                         pulsatile waveform  +---------+---------------+---------+-----------+----------+-------------------+ SFJ      Full                                                             +---------+---------------+---------+-----------+----------+-------------------+ FV  Prox  Full                                                             +---------+---------------+---------+-----------+----------+-------------------+ FV Mid   Full                                                             +---------+---------------+---------+-----------+----------+-------------------+ FV DistalFull                                                             +---------+---------------+---------+-----------+----------+-------------------+ PFV      Full                                                             +---------+---------------+---------+-----------+----------+-------------------+ POP      None           No       No                                       +---------+---------------+---------+-----------+----------+-------------------+ PTV  Not well visualized +---------+---------------+---------+-----------+----------+-------------------+ PERO                                                  Not well visualized +---------+---------------+---------+-----------+----------+-------------------+     Summary: RIGHT: - There is no evidence of deep vein thrombosis in the lower extremity. However, portions of this examination were limited- see technologist comments above.  significant interstitial fluid noted throughout the extremity  LEFT: - Findings consistent with acute deep vein thrombosis involving the left popliteal vein. Significant interstitial fluid noted throughout the extremity.  *See table(s) above for measurements and observations. Electronically signed by Lemar Livings MD on 2021/06/26 at 3:49:35 PM.    Final                LOS: 1 day   Mikenna Bunkley  [facility]   Pager on [email or web address]. If 7PM-7AM, please contact night-coverage at [email or web address]     06/04/2021, 11:43 AM

## 2021-06-05 DIAGNOSIS — Z515 Encounter for palliative care: Secondary | ICD-10-CM | POA: Diagnosis not present

## 2021-06-05 DIAGNOSIS — I5033 Acute on chronic diastolic (congestive) heart failure: Secondary | ICD-10-CM | POA: Diagnosis not present

## 2021-06-05 DIAGNOSIS — I82432 Acute embolism and thrombosis of left popliteal vein: Secondary | ICD-10-CM | POA: Diagnosis not present

## 2021-06-05 DIAGNOSIS — N179 Acute kidney failure, unspecified: Secondary | ICD-10-CM | POA: Diagnosis not present

## 2021-06-05 DIAGNOSIS — I82402 Acute embolism and thrombosis of unspecified deep veins of left lower extremity: Secondary | ICD-10-CM | POA: Diagnosis not present

## 2021-06-08 ENCOUNTER — Encounter: Payer: Medicare PPO | Admitting: Student

## 2021-06-30 NOTE — Progress Notes (Signed)
Morphine drip wasted with [place] RN into stericycle.

## 2021-06-30 NOTE — Progress Notes (Signed)
Daily Progress Note   Patient Name: [name]       Date: 2021-07-01 DOB: 1918-08-08  Age: 87 y.o. MRN#: [id number] Attending Physician: Lurene Shadow, MD Primary Care Physician: Asencion Gowda.August Saucer, MD Admit Date: 06/01/2021  Reason for Consultation/Follow-up: end of life care, goals of care  Subjective: Patient appears comfortable. Dilaudid infusion at 0.2 mg/hr. Unresponsive to voice and light touch, but will moan with any repositioning. No non-verbal signs of pain or discomfort noted. Respirations are even and unlabored. No excessive respiratory secretions noted.  Family present at bedside. Education and counseling provided on expectations at EOL. Emotional support provided.      Length of Stay: 2  Current Medications: Scheduled Meds:  . dorzolamide-timolol  1 drop Both Eyes BID  . latanoprost  1 drop Both Eyes QHS  . sodium chloride flush  3 mL Intravenous Q12H    Continuous Infusions: . sodium chloride    . HYDROmorphone 0.2 mg/hr (06/04/21 1216)    PRN Meds: sodium chloride, acetaminophen **OR** acetaminophen, antiseptic oral rinse, glycopyrrolate **OR** glycopyrrolate **OR** glycopyrrolate, haloperidol **OR** haloperidol **OR** haloperidol lactate, hydrocortisone, HYDROmorphone, LORazepam **OR** LORazepam **OR** LORazepam, ondansetron **OR** ondansetron (ZOFRAN) IV, oxyCODONE, polyvinyl alcohol, sodium chloride flush   Vital Signs: BP (!) 156/95 (BP Location: Left Arm)   Pulse 73   Temp (!) 97.4 F (36.3 C) (Oral)   Resp 16   Ht 4\' 11"  (1.499 m)   Wt 45.7 kg   SpO2 100%   BMI 20.35 kg/m  SpO2: SpO2: 100 % O2 Device: O2 Device: Room Air   LBM: Last BM Date:  (Pt. unable to give date) Baseline Weight: Weight: 45.9 kg Most recent weight: Weight: 45.7 kg        Palliative Assessment/Data: PPS 10%     Palliative Care Assessment & Plan   HPI/Patient Profile: 87 y.o. female  with past medical history of heart failure, atrial fibrillation, sinus node dysfunction and AV conduction disease s/p pacemaker placement, CAD s/p stent x 2 (2011 and 2013), HTN, and HLD. She presented to the emergency department on 06/27/2021 with left leg swelling and pain that started today. In the ED, labs are significant for creatinine of 1.21 and BNP of 3500. Vascular ultrasound with findings consistent with acute DVT of the left popliteal vein. Chest x-ray shows severe cardiomegaly but  no pulmonary congestion. Patient admitted to [facility] with acute DVT, AKI, and elevated BNP.    Assessment: - DVT left leg - AKI - functional decline - decreased oral intake - end of life care   Recommendations/Plan: Continue full comfort care Continue hydromorphone (Dilaudid) infusion  PRN medications are available for symptom management at EOL Transfer to [facility] - eligibility confirmed, no bed available today PMT will continue to support  Goals of Care and Additional Recommendations: Limitations on Scope of Treatment: Full Comfort Care  Code Status: DNR/DNI  Prognosis:  < 2 weeks  Discharge Planning: Hospice facility   Thank you for allowing the Palliative Medicine Team to assist in the care of this patient.   Total Time 15 minutes Prolonged Time Billed  no       Greater than 50%  of this time was spent counseling and coordinating care related to the above assessment and plan.  Merry Proud, NP  Please contact Palliative Medicine Team phone at [phone] for questions and concerns.

## 2021-06-30 NOTE — Progress Notes (Signed)
Progress Note    [name]  BTD:[id number] DOB: 21-Jul-1918  DOA: 06/10/2021 PCP: Clovis Riley, L.August Saucer, MD      Brief Narrative:    Medical records reviewed and are as summarized below:  [name] is a 87 y.o. female  with medical history significant of atrial fibrillation, sinus node dysfunction and AV conduction disease s/p pacemaker placement, CAD s/p septal infarction and LAD stent in 2011, stent to left circumflex in 2013, hypertension, hyperlipidemia, chronic diastolic CHF, cognitive impairment with suspected dementia, who was brought to the hospital because of pain and swelling in the left leg.  Work-up reviewed acute left popliteal DVT, AKI and acute on chronic diastolic CHF.  She was treated with Lovenox.      Assessment/Plan:   Principal Problem:   DVT (deep venous thrombosis) (HCC) Active Problems:   CAD (coronary artery disease)   HTN (hypertension)   Pacemaker   Hyperlipidemia   Paroxysmal atrial fibrillation (HCC)   Elevated brain natriuretic peptide (BNP) level   AKI (acute kidney injury) (HCC)   Acute on chronic diastolic CHF (congestive heart failure) (HCC)   Body mass index is 20.35 kg/m.   Acute DVT left popliteal vein Acute kidney injury Acute on chronic diastolic CHF with anasarca (BNP- 3,535) CAD with coronary stent Sinus node dysfunction s/p permanent pacemaker placement Hypertension Cognitive impairment with suspected dementia Generalized body pain   PLAN  Continue IV Dilaudid infusion. Continue comfort measures. Follow-up with hospice team to assist with disposition.    Diet Order             Diet regular Room service appropriate? Yes; Fluid consistency: Thin  Diet effective now                      Consultants: Palliative care and hospice team  Procedures: None    Medications:    dorzolamide-timolol  1 drop Both Eyes BID   latanoprost  1 drop Both Eyes QHS   nitroGLYCERIN  0.4 mg Transdermal q  morning   pantoprazole  40 mg Oral Daily   sodium chloride flush  3 mL Intravenous Q12H   Continuous Infusions:  sodium chloride     HYDROmorphone 0.2 mg/hr (06/04/21 1216)     Anti-infectives (From admission, onward)    None              Family Communication/Anticipated D/C date and plan/Code Status   DVT prophylaxis:      Code Status: DNR  Family Communication: None Disposition Plan:    Status is: Observation  The patient will require care spanning > 2 midnights and should be moved to inpatient because: IV treatments appropriate due to intensity of illness or inability to take PO and comfort measures  Dispo: The patient is from: Home              Anticipated d/c is to:  Hospice house              Patient currently is not medically stable to d/c.   Difficult to place patient No           Subjective:   Interval events noted.  She is unresponsive and unable to provide any history.  Objective:    Vitals:   06/03/21 1947 06/04/21 1557 06/04/21 1900 06/19/21 1131  BP: (!) 166/110 (!) 165/101 (!) 131/92 (!) 156/95  Pulse: 70 69  73  Resp: 16 16 15 16   Temp: (!) 97.4 F (  36.3 C)     TempSrc: Oral     SpO2: 100%     Weight:      Height:       No data found.   Intake/Output Summary (Last 24 hours) at 05/30/2021 1525 Last data filed at 06/21/2021 0300 Gross per 24 hour  Intake 5.75 ml  Output --  Net 5.75 ml   Filed Weights   06/16/21 1711 16-Jun-2021 2010 06/16/21 2105  Weight: 45.9 kg 47.6 kg 45.7 kg    Exam:  GEN: NAD SKIN: Warm and dry EYES: No acute abnormality ENT: Dry mucous membranes CV: RRR PULM: CTA B ABD: soft, distended, +BS CNS: Drowsy/minimally responsive  EXT: Bilateral lower extremity edema.         Data Reviewed:   I have personally reviewed following labs and imaging studies:  Labs: Labs show the following:   Basic Metabolic Panel: Recent Labs  Lab 16-Jun-2021 1320  NA 142  K 4.5  CL 106  CO2 27   GLUCOSE 118*  BUN 19  CREATININE 1.21*  CALCIUM 8.9   GFR Estimated Creatinine Clearance: 16.9 mL/min (A) (by C-G formula based on SCr of 1.21 mg/dL (H)). Liver Function Tests: No results for input(s): AST, ALT, ALKPHOS, BILITOT, PROT, ALBUMIN in the last 168 hours. No results for input(s): LIPASE, AMYLASE in the last 168 hours. No results for input(s): AMMONIA in the last 168 hours. Coagulation profile No results for input(s): INR, PROTIME in the last 168 hours.  CBC: Recent Labs  Lab Jun 16, 2021 1320  WBC 3.2*  NEUTROABS 2.2  HGB 14.4  HCT 44.5  MCV 99.1  PLT 101*   Cardiac Enzymes: No results for input(s): CKTOTAL, CKMB, CKMBINDEX, TROPONINI in the last 168 hours. BNP (last 3 results) No results for input(s): PROBNP in the last 8760 hours. CBG: No results for input(s): GLUCAP in the last 168 hours. D-Dimer: No results for input(s): DDIMER in the last 72 hours. Hgb A1c: No results for input(s): HGBA1C in the last 72 hours. Lipid Profile: No results for input(s): CHOL, HDL, LDLCALC, TRIG, CHOLHDL, LDLDIRECT in the last 72 hours. Thyroid function studies: No results for input(s): TSH, T4TOTAL, T3FREE, THYROIDAB in the last 72 hours.  Invalid input(s): FREET3 Anemia work up: No results for input(s): VITAMINB12, FOLATE, FERRITIN, TIBC, IRON, RETICCTPCT in the last 72 hours. Sepsis Labs: Recent Labs  Lab 06-16-2021 1320  WBC 3.2*    Microbiology No results found for this or any previous visit (from the past 240 hour(s)).  Procedures and diagnostic studies:  No results found.             LOS: 2 days   Austin Pongratz  [facility]   Pager on [email or web address]. If 7PM-7AM, please contact night-coverage at [email or web address]     06/11/2021, 3:25 PM

## 2021-06-30 NOTE — Progress Notes (Signed)
Civil engineer, contracting [facility]) Hospital Liaison note.    Per [facility] MD, pt is eligible for [facility].  [facility] is unable to offer a room today. Hospital Liaison will follow up tomorrow or sooner if a room becomes available. Please do not hesitate to call with questions.    Thank you for the opportunity to participate in this patient's care.  Gillian Scarce, BSN, RN [facility] Liaison (listed on [place] under Hospice[facility])    [phone] (24h on call)

## 2021-06-30 NOTE — Death Summary Note (Addendum)
DEATH SUMMARY   Patient Details  Name: [name] MRN: [id number] DOB: [birth year]  Admission/Discharge Information   Admit Date:  28-Jun-2021  Date of Death: Date of Death: 07-01-2021  Time of Death: Time of Death: 1827  Length of Stay: 2  Referring Physician: Clovis Riley, L.August Saucer, MD   Reason(s) for Hospitalization  Leg swelling  Diagnoses  Preliminary cause of death: Congestive Heart Failure Secondary Diagnoses (including complications and co-morbidities):  Principal Problem:   DVT (deep venous thrombosis) (HCC) Active Problems:   CAD (coronary artery disease)   HTN (hypertension)   Pacemaker   Hyperlipidemia   Paroxysmal atrial fibrillation (HCC)   Elevated brain natriuretic peptide (BNP) level   AKI (acute kidney injury) (HCC)   Acute on chronic diastolic CHF (congestive heart failure) [facility])   Brief Hospital Course (including significant findings, care, treatment, and services provided and events leading to death)  [name] is a 87 y.o. year old female with medical history significant of atrial fibrillation, sinus node dysfunction and AV conduction disease s/p pacemaker placement, CAD s/p septal infarction and LAD stent in 2011, stent to left circumflex in 2013, hypertension, hyperlipidemia, chronic diastolic CHF, cognitive impairment with suspected dementia, who was brought to the hospital because of pain and swelling in the left leg.   Work-up revealed acute left popliteal DVT, AKI and acute on chronic diastolic CHF with anasarca.  She was treated with Lovenox.  Palliative care team was consulted because of advanced age and poor prognosis.  His granddaughter requested comfort measures and hospice care.  Patient was transitioned to comfort measures.  Her condition deteriorated and she expired on on 07-01-21 at 6:27 PM.        Pertinent Labs and Studies  Significant Diagnostic Studies DG Chest Port 1 View  Result Date: 06-28-2021 CLINICAL DATA:  Shortness of  breath. EXAM: PORTABLE CHEST 1 VIEW COMPARISON:  CT 01/24/2021.  Chest x-ray 01/24/2021. FINDINGS: Cardiac pacer in stable position. Severe cardiomegaly again noted. No pulmonary venous congestion. No focal infiltrate. Tiny right pleural effusion again noted. No pneumothorax. Degenerative change thoracic spine. IMPRESSION: 1. Cardiac pacer stable position. Severe cardiomegaly again noted. No pulmonary venous congestion. 2.  Tiny right pleural effusion again noted. Electronically Signed   By: Maisie Fus  Register   On: 06-28-21 13:04   VAS [place] LOWER EXTREMITY VENOUS (DVT) ([facility] and [facility] 7a-7p)  Result Date: 2021/06/28  Lower Venous DVT Study Patient Name:  [name]  Date of Exam:   June 28, 2021 Medical Rec #: [id number]        Accession #:    [id number] Date of Birth: January 08, 1918        Patient Gender: F Patient Age:   89Y Exam Location:  [facility] Procedure:      VAS [place] LOWER EXTREMITY VENOUS (DVT) Referring Phys: KRISTIE HORTON --------------------------------------------------------------------------------  Indications: Pain, and Swelling.  Risk Factors: Patient has pacemaker that has reached elective replacement indication and family has chosen not to replace. Limitations: Significant interstitial and pitting edema, non cooperative patient. Comparison Study: No prior study on file Performing Technologist: Sherren Kerns RVS  Examination Guidelines: A complete evaluation includes B-mode imaging, spectral Doppler, color Doppler, and power Doppler as needed of all accessible portions of each vessel. Bilateral testing is considered an integral part of a complete examination. Limited examinations for reoccurring indications may be performed as noted. The reflux portion of the exam is performed with the patient in reverse Trendelenburg.  +---------+---------------+---------+-----------+----------+-------------------+ RIGHT    CompressibilityPhasicitySpontaneityPropertiesThrombus Aging       +---------+---------------+---------+-----------+----------+-------------------+  CFV      Full                                         pulsatile waveform  +---------+---------------+---------+-----------+----------+-------------------+ SFJ      Full                                                             +---------+---------------+---------+-----------+----------+-------------------+ FV Prox  Full                                                             +---------+---------------+---------+-----------+----------+-------------------+ FV Mid   Full                                                             +---------+---------------+---------+-----------+----------+-------------------+ FV DistalFull                                                             +---------+---------------+---------+-----------+----------+-------------------+ PFV      Full                                         pulsatile waveform  +---------+---------------+---------+-----------+----------+-------------------+ POP      Full                                                             +---------+---------------+---------+-----------+----------+-------------------+ PTV                                                   Not well visualized +---------+---------------+---------+-----------+----------+-------------------+ PERO                                                  Not well visualized +---------+---------------+---------+-----------+----------+-------------------+   +---------+---------------+---------+-----------+----------+-------------------+ LEFT     CompressibilityPhasicitySpontaneityPropertiesThrombus Aging      +---------+---------------+---------+-----------+----------+-------------------+ CFV      Full  pulsatile waveform  +---------+---------------+---------+-----------+----------+-------------------+  SFJ      Full                                                             +---------+---------------+---------+-----------+----------+-------------------+ FV Prox  Full                                                             +---------+---------------+---------+-----------+----------+-------------------+ FV Mid   Full                                                             +---------+---------------+---------+-----------+----------+-------------------+ FV DistalFull                                                             +---------+---------------+---------+-----------+----------+-------------------+ PFV      Full                                                             +---------+---------------+---------+-----------+----------+-------------------+ POP      None           No       No                                       +---------+---------------+---------+-----------+----------+-------------------+ PTV                                                   Not well visualized +---------+---------------+---------+-----------+----------+-------------------+ PERO                                                  Not well visualized +---------+---------------+---------+-----------+----------+-------------------+     Summary: RIGHT: - There is no evidence of deep vein thrombosis in the lower extremity. However, portions of this examination were limited- see technologist comments above.  significant interstitial fluid noted throughout the extremity  LEFT: - Findings consistent with acute deep vein thrombosis involving the left popliteal vein. Significant interstitial fluid noted throughout the extremity.  *See table(s) above for measurements and observations. Electronically signed by Lemar Livings MD on July 02, 2021 at 3:49:35 PM.    Final     Microbiology No results found for  this or any previous visit (from the past 240 hour(s)).  Lab Basic Metabolic  Panel: Recent Labs  Lab 06/29/2021 1320  NA 142  K 4.5  CL 106  CO2 27  GLUCOSE 118*  BUN 19  CREATININE 1.21*  CALCIUM 8.9   Liver Function Tests: No results for input(s): AST, ALT, ALKPHOS, BILITOT, PROT, ALBUMIN in the last 168 hours. No results for input(s): LIPASE, AMYLASE in the last 168 hours. No results for input(s): AMMONIA in the last 168 hours. CBC: Recent Labs  Lab 06/01/2021 1320  WBC 3.2*  NEUTROABS 2.2  HGB 14.4  HCT 44.5  MCV 99.1  PLT 101*   Cardiac Enzymes: No results for input(s): CKTOTAL, CKMB, CKMBINDEX, TROPONINI in the last 168 hours. Sepsis Labs: Recent Labs  Lab 06/28/2021 1320  WBC 3.2*    Procedures/Operations  None   Sharryn Belding 06/06/2021, 7:26 AM

## 2021-06-30 DEATH — deceased
